# Patient Record
Sex: Female | Born: 1975 | Race: White | Hispanic: No | Marital: Single | Smoking: Current every day smoker
Health system: Southern US, Community
[De-identification: ages and names within clinical notes are randomized; demographics above are authoritative.]

## PROBLEM LIST (undated history)

## (undated) DIAGNOSIS — T7840XA Allergy, unspecified, initial encounter: Secondary | ICD-10-CM

## (undated) DIAGNOSIS — F329 Major depressive disorder, single episode, unspecified: Secondary | ICD-10-CM

## (undated) DIAGNOSIS — K219 Gastro-esophageal reflux disease without esophagitis: Secondary | ICD-10-CM

## (undated) DIAGNOSIS — R569 Unspecified convulsions: Secondary | ICD-10-CM

## (undated) DIAGNOSIS — I10 Essential (primary) hypertension: Secondary | ICD-10-CM

## (undated) DIAGNOSIS — Z720 Tobacco use: Secondary | ICD-10-CM

## (undated) DIAGNOSIS — F32A Depression, unspecified: Secondary | ICD-10-CM

## (undated) DIAGNOSIS — M797 Fibromyalgia: Secondary | ICD-10-CM

## (undated) DIAGNOSIS — M1990 Unspecified osteoarthritis, unspecified site: Secondary | ICD-10-CM

## (undated) DIAGNOSIS — C50919 Malignant neoplasm of unspecified site of unspecified female breast: Secondary | ICD-10-CM

## (undated) DIAGNOSIS — G40409 Other generalized epilepsy and epileptic syndromes, not intractable, without status epilepticus: Secondary | ICD-10-CM

## (undated) HISTORY — PX: TUBAL LIGATION: SHX77

## (undated) HISTORY — DX: Malignant neoplasm of unspecified site of unspecified female breast: C50.919

## (undated) HISTORY — DX: Tobacco use: Z72.0

## (undated) HISTORY — DX: Unspecified osteoarthritis, unspecified site: M19.90

## (undated) HISTORY — DX: Other generalized epilepsy and epileptic syndromes, not intractable, without status epilepticus: G40.409

## (undated) HISTORY — DX: Allergy, unspecified, initial encounter: T78.40XA

## (undated) HISTORY — DX: Essential (primary) hypertension: I10

## (undated) HISTORY — PX: MASTECTOMY: SHX3

## (undated) HISTORY — DX: Unspecified convulsions: R56.9

## (undated) HISTORY — PX: TONSILLECTOMY: SUR1361

## (undated) HISTORY — DX: Gastro-esophageal reflux disease without esophagitis: K21.9

## (undated) HISTORY — DX: Major depressive disorder, single episode, unspecified: F32.9

## (undated) HISTORY — DX: Fibromyalgia: M79.7

## (undated) HISTORY — PX: APPENDECTOMY: SHX54

## (undated) HISTORY — DX: Depression, unspecified: F32.A

## (undated) HISTORY — PX: BREAST SURGERY: SHX581

## (undated) HISTORY — PX: OTHER SURGICAL HISTORY: SHX169

---

## 2009-02-22 ENCOUNTER — Emergency Department (HOSPITAL_BASED_OUTPATIENT_CLINIC_OR_DEPARTMENT_OTHER): Admission: EM | Admit: 2009-02-22 | Discharge: 2009-02-22 | Payer: Self-pay | Admitting: Emergency Medicine

## 2009-02-28 ENCOUNTER — Encounter: Admission: RE | Admit: 2009-02-28 | Discharge: 2009-02-28 | Payer: Self-pay | Admitting: Emergency Medicine

## 2009-04-02 ENCOUNTER — Encounter (INDEPENDENT_AMBULATORY_CARE_PROVIDER_SITE_OTHER): Payer: Self-pay | Admitting: Surgery

## 2009-04-02 ENCOUNTER — Ambulatory Visit (HOSPITAL_BASED_OUTPATIENT_CLINIC_OR_DEPARTMENT_OTHER): Admission: RE | Admit: 2009-04-02 | Discharge: 2009-04-02 | Payer: Self-pay | Admitting: Surgery

## 2009-04-05 ENCOUNTER — Ambulatory Visit: Payer: Self-pay | Admitting: Oncology

## 2009-04-05 LAB — COMPREHENSIVE METABOLIC PANEL
Albumin: 3.9 g/dL (ref 3.5–5.2)
Alkaline Phosphatase: 60 U/L (ref 39–117)
BUN: 8 mg/dL (ref 6–23)
Calcium: 9.1 mg/dL (ref 8.4–10.5)
Glucose, Bld: 84 mg/dL (ref 70–99)
Potassium: 3.8 mEq/L (ref 3.5–5.3)

## 2009-04-05 LAB — CBC WITH DIFFERENTIAL/PLATELET
BASO%: 0.8 % (ref 0.0–2.0)
EOS%: 2.6 % (ref 0.0–7.0)
HCT: 31.9 % — ABNORMAL LOW (ref 34.8–46.6)
LYMPH%: 27.6 % (ref 14.0–49.7)
MCH: 23.4 pg — ABNORMAL LOW (ref 25.1–34.0)
MCHC: 32.2 g/dL (ref 31.5–36.0)
MONO#: 0.8 10*3/uL (ref 0.1–0.9)
NEUT%: 61 % (ref 38.4–76.8)
RBC: 4.39 10*6/uL (ref 3.70–5.45)
WBC: 10.1 10*3/uL (ref 3.9–10.3)
lymph#: 2.8 10*3/uL (ref 0.9–3.3)

## 2009-04-05 LAB — LACTATE DEHYDROGENASE: LDH: 123 U/L (ref 94–250)

## 2009-04-06 ENCOUNTER — Encounter: Admission: RE | Admit: 2009-04-06 | Discharge: 2009-04-06 | Payer: Self-pay | Admitting: Surgery

## 2009-04-06 ENCOUNTER — Encounter (INDEPENDENT_AMBULATORY_CARE_PROVIDER_SITE_OTHER): Payer: Self-pay | Admitting: Surgery

## 2009-04-06 LAB — CANCER ANTIGEN 27.29: CA 27.29: 29 U/mL (ref 0–39)

## 2009-04-06 LAB — VITAMIN D 25 HYDROXY (VIT D DEFICIENCY, FRACTURES): Vit D, 25-Hydroxy: 25 ng/mL — ABNORMAL LOW (ref 30–89)

## 2009-04-09 ENCOUNTER — Ambulatory Visit (HOSPITAL_BASED_OUTPATIENT_CLINIC_OR_DEPARTMENT_OTHER): Admission: RE | Admit: 2009-04-09 | Discharge: 2009-04-10 | Payer: Self-pay | Admitting: Surgery

## 2009-04-09 ENCOUNTER — Encounter (INDEPENDENT_AMBULATORY_CARE_PROVIDER_SITE_OTHER): Payer: Self-pay | Admitting: Surgery

## 2009-04-17 LAB — MORPHOLOGY: PLT EST: INCREASED

## 2009-04-17 LAB — CHCC SMEAR

## 2009-04-17 LAB — CBC WITH DIFFERENTIAL/PLATELET
BASO%: 0.6 % (ref 0.0–2.0)
EOS%: 2.4 % (ref 0.0–7.0)
MCH: 22.9 pg — ABNORMAL LOW (ref 25.1–34.0)
MCHC: 31.9 g/dL (ref 31.5–36.0)
RBC: 4.6 10*6/uL (ref 3.70–5.45)
RDW: 16.1 % — ABNORMAL HIGH (ref 11.2–14.5)
lymph#: 2.7 10*3/uL (ref 0.9–3.3)

## 2009-04-17 LAB — IRON AND TIBC: Iron: 15 ug/dL — ABNORMAL LOW (ref 42–145)

## 2009-04-17 LAB — FERRITIN: Ferritin: 7 ng/mL — ABNORMAL LOW (ref 10–291)

## 2009-04-20 ENCOUNTER — Encounter: Admission: RE | Admit: 2009-04-20 | Discharge: 2009-04-20 | Payer: Self-pay | Admitting: Oncology

## 2009-04-24 ENCOUNTER — Ambulatory Visit (HOSPITAL_COMMUNITY): Admission: RE | Admit: 2009-04-24 | Discharge: 2009-04-24 | Payer: Self-pay | Admitting: Oncology

## 2009-04-26 LAB — CBC WITH DIFFERENTIAL/PLATELET
BASO%: 0.7 % (ref 0.0–2.0)
EOS%: 4.5 % (ref 0.0–7.0)
HCT: 30.1 % — ABNORMAL LOW (ref 34.8–46.6)
LYMPH%: 34.5 % (ref 14.0–49.7)
MCH: 23.3 pg — ABNORMAL LOW (ref 25.1–34.0)
MCHC: 32.4 g/dL (ref 31.5–36.0)
MCV: 71.8 fL — ABNORMAL LOW (ref 79.5–101.0)
MONO#: 0.6 10*3/uL (ref 0.1–0.9)
NEUT%: 51.7 % (ref 38.4–76.8)
Platelets: 365 10*3/uL (ref 145–400)

## 2009-04-26 LAB — PROTEIN / CREATININE RATIO, URINE: Creatinine, Urine: 111.4 mg/dL

## 2009-04-26 LAB — PROTHROMBIN TIME: INR: 1 (ref 0.0–1.5)

## 2009-04-26 LAB — IRON AND TIBC: TIBC: 420 ug/dL (ref 250–470)

## 2009-04-26 LAB — MORPHOLOGY: PLT EST: ADEQUATE

## 2009-04-26 LAB — FERRITIN: Ferritin: 7 ng/mL — ABNORMAL LOW (ref 10–291)

## 2009-05-01 ENCOUNTER — Encounter: Payer: Self-pay | Admitting: Cardiovascular Disease

## 2009-05-01 ENCOUNTER — Encounter: Payer: Self-pay | Admitting: Oncology

## 2009-05-01 ENCOUNTER — Ambulatory Visit: Payer: Self-pay

## 2009-05-08 ENCOUNTER — Ambulatory Visit: Admission: RE | Admit: 2009-05-08 | Discharge: 2009-07-03 | Payer: Self-pay | Admitting: Radiation Oncology

## 2009-05-09 ENCOUNTER — Ambulatory Visit (HOSPITAL_COMMUNITY): Admission: RE | Admit: 2009-05-09 | Discharge: 2009-05-09 | Payer: Self-pay | Admitting: Oncology

## 2009-05-11 ENCOUNTER — Other Ambulatory Visit: Payer: Self-pay | Admitting: Internal Medicine

## 2009-05-11 ENCOUNTER — Ambulatory Visit: Payer: Self-pay | Admitting: Internal Medicine

## 2009-05-11 ENCOUNTER — Encounter: Payer: Self-pay | Admitting: Internal Medicine

## 2009-05-11 DIAGNOSIS — F17200 Nicotine dependence, unspecified, uncomplicated: Secondary | ICD-10-CM | POA: Insufficient documentation

## 2009-05-11 DIAGNOSIS — R9389 Abnormal findings on diagnostic imaging of other specified body structures: Secondary | ICD-10-CM | POA: Insufficient documentation

## 2009-05-16 ENCOUNTER — Encounter: Payer: Self-pay | Admitting: Internal Medicine

## 2009-05-16 LAB — CBC WITH DIFFERENTIAL/PLATELET
BASO%: 0.6 % (ref 0.0–2.0)
Basophils Absolute: 0 10*3/uL (ref 0.0–0.1)
EOS%: 3.7 % (ref 0.0–7.0)
HGB: 12.3 g/dL (ref 11.6–15.9)
MCH: 25.9 pg (ref 25.1–34.0)
MCHC: 33.2 g/dL (ref 31.5–36.0)
MCV: 77.9 fL — ABNORMAL LOW (ref 79.5–101.0)
MONO%: 8.6 % (ref 0.0–14.0)
RBC: 4.76 10*6/uL (ref 3.70–5.45)
RDW: 25.2 % — ABNORMAL HIGH (ref 11.2–14.5)
lymph#: 1.6 10*3/uL (ref 0.9–3.3)

## 2009-05-16 LAB — COMPREHENSIVE METABOLIC PANEL
ALT: 14 U/L (ref 0–35)
Albumin: 3.8 g/dL (ref 3.5–5.2)
Alkaline Phosphatase: 70 U/L (ref 39–117)
CO2: 23 mEq/L (ref 19–32)
Glucose, Bld: 104 mg/dL — ABNORMAL HIGH (ref 70–99)
Potassium: 4.6 mEq/L (ref 3.5–5.3)
Sodium: 135 mEq/L (ref 135–145)
Total Protein: 7 g/dL (ref 6.0–8.3)

## 2009-05-22 ENCOUNTER — Ambulatory Visit: Payer: Self-pay | Admitting: Oncology

## 2009-05-24 ENCOUNTER — Encounter: Payer: Self-pay | Admitting: Internal Medicine

## 2009-05-24 LAB — CBC WITH DIFFERENTIAL/PLATELET
BASO%: 0.7 % (ref 0.0–2.0)
EOS%: 5 % (ref 0.0–7.0)
Eosinophils Absolute: 0.2 10*3/uL (ref 0.0–0.5)
MCHC: 33.7 g/dL (ref 31.5–36.0)
MCV: 78.7 fL — ABNORMAL LOW (ref 79.5–101.0)
MONO%: 5.4 % (ref 0.0–14.0)
NEUT#: 2.6 10*3/uL (ref 1.5–6.5)
RBC: 4.13 10*6/uL (ref 3.70–5.45)
RDW: 24 % — ABNORMAL HIGH (ref 11.2–14.5)
WBC: 4.9 10*3/uL (ref 3.9–10.3)

## 2009-05-28 ENCOUNTER — Encounter: Payer: Self-pay | Admitting: Cardiovascular Disease

## 2009-05-29 ENCOUNTER — Encounter: Payer: Self-pay | Admitting: Oncology

## 2009-05-29 ENCOUNTER — Ambulatory Visit: Payer: Self-pay

## 2009-05-29 ENCOUNTER — Encounter: Payer: Self-pay | Admitting: Cardiology

## 2009-05-30 ENCOUNTER — Encounter: Payer: Self-pay | Admitting: Internal Medicine

## 2009-05-30 LAB — CBC WITH DIFFERENTIAL/PLATELET
BASO%: 0.5 % (ref 0.0–2.0)
Eosinophils Absolute: 0.1 10*3/uL (ref 0.0–0.5)
MCHC: 33.7 g/dL (ref 31.5–36.0)
MONO#: 0.8 10*3/uL (ref 0.1–0.9)
NEUT#: 7.1 10*3/uL — ABNORMAL HIGH (ref 1.5–6.5)
Platelets: 263 10*3/uL (ref 145–400)
RBC: 4.52 10*6/uL (ref 3.70–5.45)
RDW: 25 % — ABNORMAL HIGH (ref 11.2–14.5)
WBC: 10.4 10*3/uL — ABNORMAL HIGH (ref 3.9–10.3)
lymph#: 2.4 10*3/uL (ref 0.9–3.3)

## 2009-05-30 LAB — COMPREHENSIVE METABOLIC PANEL
ALT: 14 U/L (ref 0–35)
Albumin: 4.1 g/dL (ref 3.5–5.2)
CO2: 25 mEq/L (ref 19–32)
Chloride: 106 mEq/L (ref 96–112)
Glucose, Bld: 85 mg/dL (ref 70–99)
Potassium: 4.8 mEq/L (ref 3.5–5.3)
Sodium: 141 mEq/L (ref 135–145)
Total Protein: 6.9 g/dL (ref 6.0–8.3)

## 2009-05-30 LAB — BRAIN NATRIURETIC PEPTIDE: Brain Natriuretic Peptide: 58.3 pg/mL (ref 0.0–100.0)

## 2009-06-07 ENCOUNTER — Encounter: Payer: Self-pay | Admitting: Internal Medicine

## 2009-06-07 LAB — CBC WITH DIFFERENTIAL/PLATELET
Eosinophils Absolute: 0 10*3/uL (ref 0.0–0.5)
HCT: 32.6 % — ABNORMAL LOW (ref 34.8–46.6)
HGB: 10.9 g/dL — ABNORMAL LOW (ref 11.6–15.9)
LYMPH%: 17.2 % (ref 14.0–49.7)
MONO#: 0.3 10*3/uL (ref 0.1–0.9)
NEUT#: 5.5 10*3/uL (ref 1.5–6.5)
NEUT%: 78 % — ABNORMAL HIGH (ref 38.4–76.8)
Platelets: 240 10*3/uL (ref 145–400)
WBC: 7 10*3/uL (ref 3.9–10.3)

## 2009-06-11 ENCOUNTER — Emergency Department (HOSPITAL_COMMUNITY): Admission: EM | Admit: 2009-06-11 | Discharge: 2009-06-11 | Payer: Self-pay | Admitting: Emergency Medicine

## 2009-06-12 ENCOUNTER — Ambulatory Visit: Payer: Self-pay

## 2009-06-12 ENCOUNTER — Encounter: Payer: Self-pay | Admitting: Oncology

## 2009-06-13 ENCOUNTER — Encounter: Payer: Self-pay | Admitting: Internal Medicine

## 2009-06-13 LAB — CBC WITH DIFFERENTIAL/PLATELET
BASO%: 0.8 % (ref 0.0–2.0)
Basophils Absolute: 0.1 10*3/uL (ref 0.0–0.1)
EOS%: 0.7 % (ref 0.0–7.0)
HCT: 36.5 % (ref 34.8–46.6)
HGB: 12.4 g/dL (ref 11.6–15.9)
LYMPH%: 26.8 % (ref 14.0–49.7)
MCH: 27.3 pg (ref 25.1–34.0)
MCHC: 33.9 g/dL (ref 31.5–36.0)
MONO#: 1 10*3/uL — ABNORMAL HIGH (ref 0.1–0.9)
NEUT%: 57.5 % (ref 38.4–76.8)
Platelets: 282 10*3/uL (ref 145–400)

## 2009-06-13 LAB — COMPREHENSIVE METABOLIC PANEL
ALT: 22 U/L (ref 0–35)
BUN: 9 mg/dL (ref 6–23)
CO2: 28 mEq/L (ref 19–32)
Calcium: 9.8 mg/dL (ref 8.4–10.5)
Creatinine, Ser: 0.71 mg/dL (ref 0.40–1.20)
Total Bilirubin: 0.6 mg/dL (ref 0.3–1.2)

## 2009-06-13 LAB — PROTEIN / CREATININE RATIO, URINE
Protein Creatinine Ratio: 0.06 (ref <0.15)
Total Protein, Urine: 21 mg/dL

## 2009-06-13 LAB — BRAIN NATRIURETIC PEPTIDE: Brain Natriuretic Peptide: <30 pg/mL (ref 0.0–100.0)

## 2009-06-14 ENCOUNTER — Encounter: Admission: RE | Admit: 2009-06-14 | Discharge: 2009-06-14 | Payer: Self-pay | Admitting: Oncology

## 2009-06-21 ENCOUNTER — Ambulatory Visit: Payer: Self-pay | Admitting: Oncology

## 2009-06-21 ENCOUNTER — Encounter: Payer: Self-pay | Admitting: Internal Medicine

## 2009-06-21 LAB — CBC WITH DIFFERENTIAL/PLATELET
BASO%: 0.6 % (ref 0.0–2.0)
EOS%: 0.8 % (ref 0.0–7.0)
HCT: 32.1 % — ABNORMAL LOW (ref 34.8–46.6)
MCH: 27.4 pg (ref 25.1–34.0)
MCHC: 34 g/dL (ref 31.5–36.0)
MONO#: 0.2 10*3/uL (ref 0.1–0.9)
NEUT%: 82.4 % — ABNORMAL HIGH (ref 38.4–76.8)
RBC: 3.98 10*6/uL (ref 3.70–5.45)
RDW: 25.3 % — ABNORMAL HIGH (ref 11.2–14.5)
WBC: 5.7 10*3/uL (ref 3.9–10.3)
lymph#: 0.7 10*3/uL — ABNORMAL LOW (ref 0.9–3.3)

## 2009-06-26 ENCOUNTER — Encounter: Payer: Self-pay | Admitting: Oncology

## 2009-06-26 ENCOUNTER — Ambulatory Visit: Payer: Self-pay

## 2009-06-27 ENCOUNTER — Encounter: Payer: Self-pay | Admitting: Internal Medicine

## 2009-06-27 LAB — CBC WITH DIFFERENTIAL/PLATELET
BASO%: 0.7 % (ref 0.0–2.0)
EOS%: 1.5 % (ref 0.0–7.0)
HCT: 35.7 % (ref 34.8–46.6)
MCH: 27.2 pg (ref 25.1–34.0)
MCHC: 33.4 g/dL (ref 31.5–36.0)
MONO#: 1.1 10*3/uL — ABNORMAL HIGH (ref 0.1–0.9)
NEUT%: 67.7 % (ref 38.4–76.8)
RDW: 25.5 % — ABNORMAL HIGH (ref 11.2–14.5)
WBC: 8.4 10*3/uL (ref 3.9–10.3)
lymph#: 1.4 10*3/uL (ref 0.9–3.3)

## 2009-06-27 LAB — COMPREHENSIVE METABOLIC PANEL
ALT: 17 U/L (ref 0–35)
AST: 23 U/L (ref 0–37)
Albumin: 3.7 g/dL (ref 3.5–5.2)
CO2: 27 mEq/L (ref 19–32)
Calcium: 9.3 mg/dL (ref 8.4–10.5)
Chloride: 106 mEq/L (ref 96–112)
Potassium: 4.1 mEq/L (ref 3.5–5.3)
Sodium: 140 mEq/L (ref 135–145)
Total Protein: 6.9 g/dL (ref 6.0–8.3)

## 2009-06-27 LAB — BRAIN NATRIURETIC PEPTIDE: Brain Natriuretic Peptide: 47 pg/mL (ref 0.0–100.0)

## 2009-07-03 ENCOUNTER — Encounter: Payer: Self-pay | Admitting: Internal Medicine

## 2009-07-03 LAB — CBC WITH DIFFERENTIAL/PLATELET
BASO%: 0 % (ref 0.0–2.0)
Eosinophils Absolute: 0.1 10*3/uL (ref 0.0–0.5)
HCT: 36.1 % (ref 34.8–46.6)
MCHC: 33.3 g/dL (ref 31.5–36.0)
MONO#: 0.1 10*3/uL (ref 0.1–0.9)
NEUT#: 13.1 10*3/uL — ABNORMAL HIGH (ref 1.5–6.5)
NEUT%: 92.1 % — ABNORMAL HIGH (ref 38.4–76.8)
WBC: 14.2 10*3/uL — ABNORMAL HIGH (ref 3.9–10.3)
lymph#: 0.9 10*3/uL (ref 0.9–3.3)

## 2009-07-06 ENCOUNTER — Encounter (INDEPENDENT_AMBULATORY_CARE_PROVIDER_SITE_OTHER): Payer: Self-pay | Admitting: *Deleted

## 2009-07-06 ENCOUNTER — Ambulatory Visit: Payer: Self-pay

## 2009-07-06 ENCOUNTER — Encounter: Payer: Self-pay | Admitting: Oncology

## 2009-07-11 ENCOUNTER — Encounter: Payer: Self-pay | Admitting: Internal Medicine

## 2009-07-11 LAB — COMPREHENSIVE METABOLIC PANEL
ALT: 19 U/L (ref 0–35)
CO2: 28 mEq/L (ref 19–32)
Chloride: 106 mEq/L (ref 96–112)
Sodium: 142 mEq/L (ref 135–145)
Total Bilirubin: 0.3 mg/dL (ref 0.3–1.2)
Total Protein: 6.8 g/dL (ref 6.0–8.3)

## 2009-07-11 LAB — CBC WITH DIFFERENTIAL/PLATELET
Basophils Absolute: 0 10*3/uL (ref 0.0–0.1)
HCT: 35.9 % (ref 34.8–46.6)
HGB: 11.9 g/dL (ref 11.6–15.9)
MCH: 27.7 pg (ref 25.1–34.0)
MONO#: 1.1 10*3/uL — ABNORMAL HIGH (ref 0.1–0.9)
NEUT%: 61 % (ref 38.4–76.8)
Platelets: 239 10*3/uL (ref 145–400)
WBC: 6.2 10*3/uL (ref 3.9–10.3)
lymph#: 1.1 10*3/uL (ref 0.9–3.3)

## 2009-07-11 LAB — APTT: aPTT: 27 seconds (ref 24–37)

## 2009-07-11 LAB — PROTIME-INR

## 2009-07-11 LAB — PROTEIN / CREATININE RATIO, URINE: Creatinine, Urine: 45.4 mg/dL

## 2009-07-17 ENCOUNTER — Ambulatory Visit: Payer: Self-pay | Admitting: Oncology

## 2009-07-19 ENCOUNTER — Encounter: Payer: Self-pay | Admitting: Internal Medicine

## 2009-07-19 LAB — CBC WITH DIFFERENTIAL/PLATELET
Basophils Absolute: 0.1 10*3/uL (ref 0.0–0.1)
EOS%: 1.4 % (ref 0.0–7.0)
HGB: 12.2 g/dL (ref 11.6–15.9)
LYMPH%: 18.9 % (ref 14.0–49.7)
MCH: 28.5 pg (ref 25.1–34.0)
MCV: 83.5 fL (ref 79.5–101.0)
MONO%: 10.5 % (ref 0.0–14.0)
NEUT%: 68.3 % (ref 38.4–76.8)
RDW: 23.1 % — ABNORMAL HIGH (ref 11.2–14.5)

## 2009-07-26 ENCOUNTER — Encounter: Payer: Self-pay | Admitting: Internal Medicine

## 2009-07-26 LAB — CBC WITH DIFFERENTIAL/PLATELET
EOS%: 2.5 % (ref 0.0–7.0)
MCH: 28.5 pg (ref 25.1–34.0)
MCHC: 33.4 g/dL (ref 31.5–36.0)
MCV: 85.3 fL (ref 79.5–101.0)
MONO%: 12.2 % (ref 0.0–14.0)
NEUT#: 3.9 10*3/uL (ref 1.5–6.5)
RBC: 4.19 10*6/uL (ref 3.70–5.45)
RDW: 21.9 % — ABNORMAL HIGH (ref 11.2–14.5)

## 2009-08-01 ENCOUNTER — Encounter: Payer: Self-pay | Admitting: Internal Medicine

## 2009-08-01 LAB — CBC WITH DIFFERENTIAL/PLATELET
BASO%: 0.7 % (ref 0.0–2.0)
LYMPH%: 20.3 % (ref 14.0–49.7)
MCHC: 33.7 g/dL (ref 31.5–36.0)
MONO#: 1 10*3/uL — ABNORMAL HIGH (ref 0.1–0.9)
MONO%: 14.9 % — ABNORMAL HIGH (ref 0.0–14.0)
NEUT#: 3.9 10*3/uL (ref 1.5–6.5)
Platelets: 321 10*3/uL (ref 145–400)
RBC: 4.44 10*6/uL (ref 3.70–5.45)
RDW: 19.7 % — ABNORMAL HIGH (ref 11.2–14.5)
WBC: 6.6 10*3/uL (ref 3.9–10.3)

## 2009-08-01 LAB — COMPREHENSIVE METABOLIC PANEL
ALT: 37 U/L — ABNORMAL HIGH (ref 0–35)
Albumin: 3.8 g/dL (ref 3.5–5.2)
Alkaline Phosphatase: 67 U/L (ref 39–117)
CO2: 26 mEq/L (ref 19–32)
Potassium: 4.4 mEq/L (ref 3.5–5.3)
Sodium: 139 mEq/L (ref 135–145)
Total Bilirubin: 0.2 mg/dL — ABNORMAL LOW (ref 0.3–1.2)
Total Protein: 7.3 g/dL (ref 6.0–8.3)

## 2009-08-01 LAB — URINALYSIS, MICROSCOPIC - CHCC
Nitrite: NEGATIVE
pH: 5 (ref 4.6–8.0)

## 2009-08-09 ENCOUNTER — Encounter: Payer: Self-pay | Admitting: Internal Medicine

## 2009-08-09 LAB — CBC WITH DIFFERENTIAL/PLATELET
Basophils Absolute: 0.1 10*3/uL (ref 0.0–0.1)
Eosinophils Absolute: 0.5 10*3/uL (ref 0.0–0.5)
HCT: 40.6 % (ref 34.8–46.6)
HGB: 13.5 g/dL (ref 11.6–15.9)
LYMPH%: 19.5 % (ref 14.0–49.7)
MONO#: 0.6 10*3/uL (ref 0.1–0.9)
NEUT#: 5.9 10*3/uL (ref 1.5–6.5)
NEUT%: 66.6 % (ref 38.4–76.8)
Platelets: 328 10*3/uL (ref 145–400)
WBC: 8.8 10*3/uL (ref 3.9–10.3)

## 2009-08-20 ENCOUNTER — Ambulatory Visit: Payer: Self-pay | Admitting: Oncology

## 2009-08-22 ENCOUNTER — Encounter: Payer: Self-pay | Admitting: Family Medicine

## 2009-08-22 ENCOUNTER — Encounter: Payer: Self-pay | Admitting: Internal Medicine

## 2009-08-22 LAB — CBC WITH DIFFERENTIAL/PLATELET
BASO%: 0.7 % (ref 0.0–2.0)
HCT: 35.7 % (ref 34.8–46.6)
LYMPH%: 20.3 % (ref 14.0–49.7)
MCH: 29.5 pg (ref 25.1–34.0)
MCHC: 34 g/dL (ref 31.5–36.0)
MCV: 87 fL (ref 79.5–101.0)
MONO#: 0.8 10*3/uL (ref 0.1–0.9)
NEUT%: 60.2 % (ref 38.4–76.8)
Platelets: 315 10*3/uL (ref 145–400)
WBC: 5.6 10*3/uL (ref 3.9–10.3)

## 2009-08-22 LAB — COMPREHENSIVE METABOLIC PANEL
BUN: 7 mg/dL (ref 6–23)
CO2: 25 mEq/L (ref 19–32)
Calcium: 9 mg/dL (ref 8.4–10.5)
Chloride: 111 mEq/L (ref 96–112)
Creatinine, Ser: 0.67 mg/dL (ref 0.40–1.20)
Total Bilirubin: 0.5 mg/dL (ref 0.3–1.2)

## 2009-08-22 LAB — PROTEIN / CREATININE RATIO, URINE
Creatinine, Urine: 128.5 mg/dL
Protein Creatinine Ratio: 0.06 (ref <0.15)
Total Protein, Urine: 8 mg/dL

## 2009-08-30 ENCOUNTER — Encounter: Payer: Self-pay | Admitting: Internal Medicine

## 2009-08-30 LAB — CBC WITH DIFFERENTIAL/PLATELET
BASO%: 0.3 % (ref 0.0–2.0)
Eosinophils Absolute: 0.2 10*3/uL (ref 0.0–0.5)
LYMPH%: 17.1 % (ref 14.0–49.7)
MCHC: 34.3 g/dL (ref 31.5–36.0)
MONO#: 0.4 10*3/uL (ref 0.1–0.9)
NEUT#: 5 10*3/uL (ref 1.5–6.5)
Platelets: 285 10*3/uL (ref 145–400)
RBC: 4.09 10*6/uL (ref 3.70–5.45)
RDW: 15.9 % — ABNORMAL HIGH (ref 11.2–14.5)
WBC: 6.7 10*3/uL (ref 3.9–10.3)
lymph#: 1.1 10*3/uL (ref 0.9–3.3)

## 2009-09-02 ENCOUNTER — Encounter: Payer: Self-pay | Admitting: Emergency Medicine

## 2009-09-02 ENCOUNTER — Encounter: Payer: Self-pay | Admitting: Family Medicine

## 2009-09-02 DIAGNOSIS — F411 Generalized anxiety disorder: Secondary | ICD-10-CM

## 2009-09-02 DIAGNOSIS — R569 Unspecified convulsions: Secondary | ICD-10-CM

## 2009-09-02 DIAGNOSIS — C50919 Malignant neoplasm of unspecified site of unspecified female breast: Secondary | ICD-10-CM

## 2009-09-03 ENCOUNTER — Ambulatory Visit: Payer: Self-pay | Admitting: Family Medicine

## 2009-09-03 ENCOUNTER — Observation Stay (HOSPITAL_COMMUNITY): Admission: EM | Admit: 2009-09-03 | Discharge: 2009-09-03 | Payer: Self-pay | Admitting: Family Medicine

## 2009-09-06 ENCOUNTER — Encounter: Payer: Self-pay | Admitting: Internal Medicine

## 2009-09-06 LAB — CBC WITH DIFFERENTIAL/PLATELET
Basophils Absolute: 0.1 10*3/uL (ref 0.0–0.1)
EOS%: 2.4 % (ref 0.0–7.0)
Eosinophils Absolute: 0.2 10*3/uL (ref 0.0–0.5)
HCT: 41 % (ref 34.8–46.6)
HGB: 13.4 g/dL (ref 11.6–15.9)
MONO#: 0.5 10*3/uL (ref 0.1–0.9)
NEUT#: 5.3 10*3/uL (ref 1.5–6.5)
RDW: 15.9 % — ABNORMAL HIGH (ref 11.2–14.5)
lymph#: 1.5 10*3/uL (ref 0.9–3.3)

## 2009-09-10 ENCOUNTER — Ambulatory Visit: Payer: Self-pay | Admitting: Family Medicine

## 2009-09-10 DIAGNOSIS — J328 Other chronic sinusitis: Secondary | ICD-10-CM | POA: Insufficient documentation

## 2009-09-12 ENCOUNTER — Encounter: Payer: Self-pay | Admitting: Internal Medicine

## 2009-09-12 LAB — COMPREHENSIVE METABOLIC PANEL
ALT: 31 U/L (ref 0–35)
AST: 26 U/L (ref 0–37)
Alkaline Phosphatase: 88 U/L (ref 39–117)
BUN: 7 mg/dL (ref 6–23)
Calcium: 10.4 mg/dL (ref 8.4–10.5)
Chloride: 103 mEq/L (ref 96–112)
Creatinine, Ser: 0.76 mg/dL (ref 0.40–1.20)
Potassium: 4.6 mEq/L (ref 3.5–5.3)

## 2009-09-12 LAB — CBC WITH DIFFERENTIAL/PLATELET
BASO%: 0.4 % (ref 0.0–2.0)
EOS%: 2.2 % (ref 0.0–7.0)
HCT: 41.1 % (ref 34.8–46.6)
HGB: 13.9 g/dL (ref 11.6–15.9)
MCHC: 33.7 g/dL (ref 31.5–36.0)
MONO%: 7.1 % (ref 0.0–14.0)
NEUT#: 5.1 10*3/uL (ref 1.5–6.5)
NEUT%: 72.2 % (ref 38.4–76.8)
Platelets: 373 10*3/uL (ref 145–400)
RDW: 16.5 % — ABNORMAL HIGH (ref 11.2–14.5)
WBC: 7 10*3/uL (ref 3.9–10.3)
lymph#: 1.3 10*3/uL (ref 0.9–3.3)

## 2009-09-20 ENCOUNTER — Ambulatory Visit: Payer: Self-pay | Admitting: Oncology

## 2009-09-20 ENCOUNTER — Encounter: Payer: Self-pay | Admitting: Internal Medicine

## 2009-09-20 LAB — CBC WITH DIFFERENTIAL/PLATELET
BASO%: 0.8 % (ref 0.0–2.0)
Basophils Absolute: 0.1 10*3/uL (ref 0.0–0.1)
EOS%: 2 % (ref 0.0–7.0)
HCT: 41.3 % (ref 34.8–46.6)
HGB: 13.7 g/dL (ref 11.6–15.9)
LYMPH%: 22.4 % (ref 14.0–49.7)
MCH: 29.2 pg (ref 25.1–34.0)
MCHC: 33.2 g/dL (ref 31.5–36.0)
MCV: 88.1 fL (ref 79.5–101.0)
MONO%: 7.1 % (ref 0.0–14.0)
NEUT%: 67.7 % (ref 38.4–76.8)
lymph#: 1.6 10*3/uL (ref 0.9–3.3)

## 2009-09-27 ENCOUNTER — Encounter: Payer: Self-pay | Admitting: Internal Medicine

## 2009-09-27 LAB — CBC WITH DIFFERENTIAL/PLATELET
BASO%: 1.1 % (ref 0.0–2.0)
Basophils Absolute: 0.1 10*3/uL (ref 0.0–0.1)
EOS%: 2 % (ref 0.0–7.0)
MCH: 29.9 pg (ref 25.1–34.0)
MCHC: 33.4 g/dL (ref 31.5–36.0)
MCV: 89.5 fL (ref 79.5–101.0)
MONO%: 7.2 % (ref 0.0–14.0)
RBC: 4.85 10*6/uL (ref 3.70–5.45)
RDW: 16.2 % — ABNORMAL HIGH (ref 11.2–14.5)
lymph#: 1.6 10*3/uL (ref 0.9–3.3)

## 2009-10-01 ENCOUNTER — Ambulatory Visit: Payer: Self-pay

## 2009-10-01 ENCOUNTER — Encounter: Payer: Self-pay | Admitting: Family Medicine

## 2009-10-01 ENCOUNTER — Ambulatory Visit: Payer: Self-pay | Admitting: Internal Medicine

## 2009-10-01 ENCOUNTER — Encounter: Payer: Self-pay | Admitting: Oncology

## 2009-10-01 ENCOUNTER — Ambulatory Visit (HOSPITAL_COMMUNITY): Admission: RE | Admit: 2009-10-01 | Discharge: 2009-10-01 | Payer: Self-pay | Admitting: Oncology

## 2009-10-02 ENCOUNTER — Encounter: Payer: Self-pay | Admitting: Internal Medicine

## 2009-10-03 ENCOUNTER — Ambulatory Visit: Admission: RE | Admit: 2009-10-03 | Discharge: 2009-11-30 | Payer: Self-pay | Admitting: Radiation Oncology

## 2009-10-04 ENCOUNTER — Encounter: Payer: Self-pay | Admitting: Internal Medicine

## 2009-10-18 ENCOUNTER — Encounter: Payer: Self-pay | Admitting: Internal Medicine

## 2009-10-18 LAB — CBC WITH DIFFERENTIAL/PLATELET
Basophils Absolute: 0 10*3/uL (ref 0.0–0.1)
Eosinophils Absolute: 0.3 10*3/uL (ref 0.0–0.5)
HCT: 43.6 % (ref 34.8–46.6)
HGB: 14.7 g/dL (ref 11.6–15.9)
MCH: 30 pg (ref 25.1–34.0)
MCV: 89.1 fL (ref 79.5–101.0)
MONO%: 9.1 % (ref 0.0–14.0)
NEUT#: 6.2 10*3/uL (ref 1.5–6.5)
NEUT%: 70.6 % (ref 38.4–76.8)
RDW: 16.7 % — ABNORMAL HIGH (ref 11.2–14.5)
lymph#: 1.5 10*3/uL (ref 0.9–3.3)

## 2009-10-18 LAB — COMPREHENSIVE METABOLIC PANEL
Albumin: 4.2 g/dL (ref 3.5–5.2)
BUN: 7 mg/dL (ref 6–23)
Calcium: 9.6 mg/dL (ref 8.4–10.5)
Chloride: 105 mEq/L (ref 96–112)
Creatinine, Ser: 0.67 mg/dL (ref 0.40–1.20)
Glucose, Bld: 100 mg/dL — ABNORMAL HIGH (ref 70–99)
Potassium: 4.3 mEq/L (ref 3.5–5.3)

## 2009-11-08 ENCOUNTER — Ambulatory Visit: Payer: Self-pay | Admitting: Oncology

## 2009-11-12 ENCOUNTER — Encounter: Admission: RE | Admit: 2009-11-12 | Discharge: 2009-11-12 | Payer: Self-pay | Admitting: Oncology

## 2009-12-03 ENCOUNTER — Ambulatory Visit: Admission: RE | Admit: 2009-12-03 | Discharge: 2009-12-14 | Payer: Self-pay | Admitting: Radiation Oncology

## 2009-12-18 ENCOUNTER — Ambulatory Visit: Payer: Self-pay | Admitting: Oncology

## 2009-12-26 ENCOUNTER — Encounter: Payer: Self-pay | Admitting: Internal Medicine

## 2009-12-27 ENCOUNTER — Encounter: Payer: Self-pay | Admitting: Internal Medicine

## 2009-12-27 LAB — COMPREHENSIVE METABOLIC PANEL
AST: 18 U/L (ref 0–37)
Alkaline Phosphatase: 58 U/L (ref 39–117)
BUN: 11 mg/dL (ref 6–23)
Creatinine, Ser: 0.65 mg/dL (ref 0.40–1.20)
Potassium: 4.1 mEq/L (ref 3.5–5.3)

## 2009-12-27 LAB — CBC WITH DIFFERENTIAL/PLATELET
Basophils Absolute: 0 10*3/uL (ref 0.0–0.1)
EOS%: 2.6 % (ref 0.0–7.0)
HGB: 14.4 g/dL (ref 11.6–15.9)
MCH: 30.6 pg (ref 25.1–34.0)
MCV: 89.5 fL (ref 79.5–101.0)
MONO%: 6.9 % (ref 0.0–14.0)
NEUT%: 78.6 % — ABNORMAL HIGH (ref 38.4–76.8)
RDW: 14 % (ref 11.2–14.5)

## 2010-01-04 ENCOUNTER — Ambulatory Visit: Payer: Self-pay | Admitting: Family Medicine

## 2010-01-04 DIAGNOSIS — R1011 Right upper quadrant pain: Secondary | ICD-10-CM

## 2010-01-04 DIAGNOSIS — F329 Major depressive disorder, single episode, unspecified: Secondary | ICD-10-CM

## 2010-01-04 DIAGNOSIS — G4700 Insomnia, unspecified: Secondary | ICD-10-CM

## 2010-01-09 ENCOUNTER — Encounter: Admission: RE | Admit: 2010-01-09 | Discharge: 2010-01-09 | Payer: Self-pay | Admitting: Family Medicine

## 2010-01-13 ENCOUNTER — Encounter: Payer: Self-pay | Admitting: Family Medicine

## 2010-01-16 ENCOUNTER — Encounter: Admission: RE | Admit: 2010-01-16 | Discharge: 2010-01-16 | Payer: Self-pay | Admitting: Surgery

## 2010-01-16 ENCOUNTER — Ambulatory Visit: Payer: Self-pay | Admitting: Psychiatry

## 2010-01-23 ENCOUNTER — Ambulatory Visit: Payer: Self-pay | Admitting: Psychiatry

## 2010-01-29 HISTORY — PX: CHOLECYSTECTOMY: SHX55

## 2010-02-04 ENCOUNTER — Encounter (INDEPENDENT_AMBULATORY_CARE_PROVIDER_SITE_OTHER): Payer: Self-pay | Admitting: Surgery

## 2010-02-04 ENCOUNTER — Inpatient Hospital Stay (HOSPITAL_COMMUNITY): Admission: RE | Admit: 2010-02-04 | Discharge: 2010-02-06 | Payer: Self-pay | Admitting: Surgery

## 2010-02-27 ENCOUNTER — Encounter: Admission: RE | Admit: 2010-02-27 | Discharge: 2010-02-27 | Payer: Self-pay | Admitting: General Surgery

## 2010-03-28 ENCOUNTER — Ambulatory Visit: Payer: Self-pay | Admitting: Oncology

## 2010-03-29 ENCOUNTER — Encounter: Payer: Self-pay | Admitting: Internal Medicine

## 2010-03-29 LAB — COMPREHENSIVE METABOLIC PANEL
ALT: 15 U/L (ref 0–35)
CO2: 22 mEq/L (ref 19–32)
Calcium: 8.8 mg/dL (ref 8.4–10.5)
Chloride: 107 mEq/L (ref 96–112)
Glucose, Bld: 117 mg/dL — ABNORMAL HIGH (ref 70–99)
Sodium: 136 mEq/L (ref 135–145)
Total Bilirubin: 0.4 mg/dL (ref 0.3–1.2)
Total Protein: 7.4 g/dL (ref 6.0–8.3)

## 2010-03-29 LAB — CBC WITH DIFFERENTIAL/PLATELET
BASO%: 0.3 % (ref 0.0–2.0)
Eosinophils Absolute: 0.2 10*3/uL (ref 0.0–0.5)
HCT: 43.5 % (ref 34.8–46.6)
LYMPH%: 17.7 % (ref 14.0–49.7)
MCHC: 33.1 g/dL (ref 31.5–36.0)
MONO#: 0.8 10*3/uL (ref 0.1–0.9)
NEUT#: 6.5 10*3/uL (ref 1.5–6.5)
NEUT%: 70.8 % (ref 38.4–76.8)
Platelets: 306 10*3/uL (ref 145–400)
RBC: 4.85 10*6/uL (ref 3.70–5.45)
WBC: 9.1 10*3/uL (ref 3.9–10.3)
lymph#: 1.6 10*3/uL (ref 0.9–3.3)

## 2010-04-02 LAB — CANCER ANTIGEN 27.29: CA 27.29: 33 U/mL (ref 0–39)

## 2010-04-02 LAB — LEVETIRACETAM LEVEL: Keppra (Levetiracetam): 16.6 ug/mL (ref 5.0–30.0)

## 2010-05-13 ENCOUNTER — Ambulatory Visit: Payer: Self-pay | Admitting: Oncology

## 2010-05-15 ENCOUNTER — Ambulatory Visit: Payer: Self-pay

## 2010-05-15 ENCOUNTER — Encounter: Payer: Self-pay | Admitting: Oncology

## 2010-05-15 ENCOUNTER — Ambulatory Visit: Payer: Self-pay | Admitting: Cardiology

## 2010-05-15 ENCOUNTER — Ambulatory Visit (HOSPITAL_COMMUNITY): Admission: RE | Admit: 2010-05-15 | Discharge: 2010-05-15 | Payer: Self-pay | Admitting: Oncology

## 2010-06-26 ENCOUNTER — Ambulatory Visit: Payer: Self-pay | Admitting: Oncology

## 2010-06-28 ENCOUNTER — Encounter: Payer: Self-pay | Admitting: Internal Medicine

## 2010-06-28 LAB — CBC WITH DIFFERENTIAL/PLATELET
EOS%: 2.6 % (ref 0.0–7.0)
LYMPH%: 22.3 % (ref 14.0–49.7)
MCH: 28.3 pg (ref 25.1–34.0)
MCHC: 33 g/dL (ref 31.5–36.0)
MCV: 85.6 fL (ref 79.5–101.0)
MONO%: 8.4 % (ref 0.0–14.0)
Platelets: 295 10*3/uL (ref 145–400)
RBC: 4.96 10*6/uL (ref 3.70–5.45)
RDW: 13.9 % (ref 11.2–14.5)

## 2010-06-28 LAB — BASIC METABOLIC PANEL
Calcium: 10.2 mg/dL (ref 8.4–10.5)
Potassium: 4.1 mEq/L (ref 3.5–5.3)
Sodium: 142 mEq/L (ref 135–145)

## 2010-09-02 ENCOUNTER — Encounter: Admission: RE | Admit: 2010-09-02 | Discharge: 2010-09-02 | Payer: Self-pay | Admitting: Neurology

## 2010-09-23 ENCOUNTER — Encounter: Admission: RE | Admit: 2010-09-23 | Discharge: 2010-09-23 | Payer: Self-pay | Admitting: Diagnostic Neuroimaging

## 2010-09-25 ENCOUNTER — Ambulatory Visit: Payer: Self-pay | Admitting: Oncology

## 2010-09-27 ENCOUNTER — Encounter: Payer: Self-pay | Admitting: Internal Medicine

## 2010-09-27 LAB — CBC WITH DIFFERENTIAL/PLATELET
Basophils Absolute: 0 10*3/uL (ref 0.0–0.1)
EOS%: 2.5 % (ref 0.0–7.0)
HCT: 38 % (ref 34.8–46.6)
HGB: 12.7 g/dL (ref 11.6–15.9)
MCH: 28 pg (ref 25.1–34.0)
MCV: 83.7 fL (ref 79.5–101.0)
MONO%: 8.3 % (ref 0.0–14.0)
NEUT%: 69.6 % (ref 38.4–76.8)
Platelets: 326 10*3/uL (ref 145–400)

## 2010-09-28 LAB — COMPREHENSIVE METABOLIC PANEL
Albumin: 4.3 g/dL (ref 3.5–5.2)
Alkaline Phosphatase: 71 U/L (ref 39–117)
BUN: 8 mg/dL (ref 6–23)
Glucose, Bld: 107 mg/dL — ABNORMAL HIGH (ref 70–99)
Total Bilirubin: 0.2 mg/dL — ABNORMAL LOW (ref 0.3–1.2)

## 2010-09-28 LAB — LACTATE DEHYDROGENASE: LDH: 116 U/L (ref 94–250)

## 2010-09-28 LAB — CANCER ANTIGEN 27.29: CA 27.29: 21 U/mL (ref 0–39)

## 2010-10-10 ENCOUNTER — Ambulatory Visit: Payer: Self-pay | Admitting: Family Medicine

## 2010-10-10 ENCOUNTER — Encounter: Payer: Self-pay | Admitting: Family Medicine

## 2010-10-10 DIAGNOSIS — N951 Menopausal and female climacteric states: Secondary | ICD-10-CM | POA: Insufficient documentation

## 2010-10-29 ENCOUNTER — Telehealth (INDEPENDENT_AMBULATORY_CARE_PROVIDER_SITE_OTHER): Payer: Self-pay | Admitting: *Deleted

## 2010-12-10 ENCOUNTER — Ambulatory Visit: Admission: RE | Admit: 2010-12-10 | Discharge: 2010-12-10 | Payer: Self-pay | Source: Home / Self Care

## 2010-12-11 ENCOUNTER — Telehealth: Payer: Self-pay | Admitting: Family Medicine

## 2010-12-22 ENCOUNTER — Encounter: Payer: Self-pay | Admitting: Emergency Medicine

## 2010-12-23 ENCOUNTER — Encounter: Payer: Self-pay | Admitting: Oncology

## 2010-12-23 ENCOUNTER — Encounter: Payer: Self-pay | Admitting: Surgery

## 2010-12-27 ENCOUNTER — Ambulatory Visit: Payer: Self-pay | Admitting: Oncology

## 2010-12-31 LAB — COMPREHENSIVE METABOLIC PANEL
ALT: 8 U/L (ref 0–35)
Albumin: 4 g/dL (ref 3.5–5.2)
CO2: 21 mEq/L (ref 19–32)
Calcium: 9.4 mg/dL (ref 8.4–10.5)
Chloride: 105 mEq/L (ref 96–112)
Creatinine, Ser: 0.82 mg/dL (ref 0.40–1.20)
Potassium: 4.2 mEq/L (ref 3.5–5.3)
Sodium: 141 mEq/L (ref 135–145)
Total Protein: 6.7 g/dL (ref 6.0–8.3)

## 2010-12-31 LAB — CBC WITH DIFFERENTIAL/PLATELET
BASO%: 1 % (ref 0.0–2.0)
HCT: 35.6 % (ref 34.8–46.6)
HGB: 11.6 g/dL (ref 11.6–15.9)
MCHC: 32.6 g/dL (ref 31.5–36.0)
MONO#: 0.5 10*3/uL (ref 0.1–0.9)
NEUT%: 62.5 % (ref 38.4–76.8)
RDW: 15.3 % — ABNORMAL HIGH (ref 11.2–14.5)
WBC: 6.9 10*3/uL (ref 3.9–10.3)
lymph#: 1.8 10*3/uL (ref 0.9–3.3)

## 2010-12-31 LAB — LACTATE DEHYDROGENASE: LDH: 121 U/L (ref 94–250)

## 2011-01-02 NOTE — Letter (Signed)
[facility]: [facility]   [facility]   Imported By: Roderic Ovens 11/04/2010 10:59:14  _____________________________________________________________________  External Attachment:    Type:   Image     Comment:   External Document

## 2011-01-02 NOTE — Miscellaneous (Signed)
Summary: Controlled Substance Contract  Controlled Substance Contract   Imported By: De Nurse 10/18/2010 16:59:31  _____________________________________________________________________  External Attachment:    Type:   Image     Comment:   External Document

## 2011-01-02 NOTE — Progress Notes (Signed)
  DDS Request received sent to [employer]. Madison [name]  October 29, 2010 8:34 AM

## 2011-01-02 NOTE — Letter (Signed)
[facility]   Imported By: Roderic Ovens 08/16/2010 11:04:23  _____________________________________________________________________  External Attachment:    Type:   Image     Comment:   External Document

## 2011-01-02 NOTE — Progress Notes (Signed)
Summary: Rx  Phone Note Call from Patient Call back at Home Phone [phone]   Reason for Call: Talk to Nurse Summary of Call: pt still waiting on rx for zoloft to be sent to pharmacy, [facility]/[place] Initial call taken by: Knox Royalty,  December 11, 2010 2:13 PM  Follow-up for Phone Call        to pcp to add to med list . I called it in to pharmacy. they do not make 150 mg. I ordered 50mg  3 at a time daily. pt notified Follow-up by: Golden Circle RN,  December 11, 2010 2:25 PM

## 2011-01-02 NOTE — Assessment & Plan Note (Signed)
Summary: cpe/pap,tcb   Vital Signs:  Patient profile:   35 year old female Height:      65 inches Weight:      223.6 pounds BMI:     37.34 Temp:     98.4 degrees F oral Pulse rate:   88 / minute BP sitting:   149 / 88 Cuff size:   large  Vitals Entered By: Garen Grams LPN (October 10, 2010 10:41 AM) CC: CPE Is Patient Diabetic? No   Primary Care Addalynne Golding:  Alvia Grove DO  CC:  CPE.  History of Present Illness: 35 yo female here for follow up of multple medical problems: 1. breast cancer:completed chemo.  Last seen by oncology on 06-28-10.  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  She had been placed on Effexr for her hot flashes back in July, but has since stopped taking it because she felt it was not helping her. Pt  is to continue her tamoxifen daily and to follow up wuth oncology every 3 months.  Plan per pt report, is to continue on Tamoxifen for 5 more years.  Pt would still like to explore the option of having a hysterectomy, but has not been able to f/u with gyn as expected due to her recent seizures.  Per pt, oncology has nothing else planned or to offer other than the daily tamoxifen.  They will see her Q3 months.  2. Seizures: Last seizure October 4th 2011. Pt was seen by neuro and her medication was increased to Keppra 1000mg  two times a day.  Pt was instructed to: not be alone, not drive, not operate heavy machines.  Follow up schedled with neuro for next month. No seizures since October 4th. 3. Depression: In part situational.  Pt currently seeing a counselor and seems to have good insight to her medical conditions. Understands her diagnosises and her likely outcomes, angry about this, but reactions are very appropriate.     Habits & Providers  Alcohol-Tobacco-Diet  Tobacco Status: current     Tobacco Counseling: to quit use of tobacco products     Cigarette Packs/Day: 1.0  Current Problems (verified): 1)  Hot Flashes  (ICD-627.2) 2)  Neoplasm, Malignant, Breast, Her2 Negative  (ICD-174.9) 3)  Mastectomy, Radical, With Axillary Lymph Nodes, Hx of  (ICD-V45.71) 4)  Insomnia  (ICD-780.52) 5)  Depression, Situational  (ICD-309.0) 6)  Abdominal Pain Right Upper Quadrant  (ICD-789.01) 7)  Other Chronic Sinusitis  (ICD-473.8) 8)  Anxiety State, Unspecified  (ICD-300.00) 9)  Breast Cancer  (ICD-174.9) 10)  Seizure Disorder  (ICD-780.39) 11)  Tobacco User  (ICD-305.1) 12)  Echocardiogram, Abnormal  (ICD-793.2)  Current Medications (verified): 1)  Keppra 1000 Mg Tabs (Levetiracetam) .... Take 1 By Mouth Bid 2)  Ativan 2 Mg Tabs (Lorazepam) .Marland Kitchen.. 1 Pill By Mouth Three Times A Day As Needed Anxiety 3)  Ranitidine Hcl 150 Mg Tabs (Ranitidine Hcl) .... Take 1 By Mouth Bid 4)  Pepcid 20 Mg Tabs (Famotidine) .... Take 1 By Mouth Two Times A Day 5)  Norvasc 10 Mg Tabs (Amlodipine Besylate) .... Take 1 By Mouth in The Am For Htn 6)  Magic Mouthwash .... Swish 15ml As Needed For Mouth Sores or Sensativites 7)  Tamoxifen Citrate 20 Mg Tabs (Tamoxifen Citrate) .... Take 1 Pill Daily 8)  Lidoderm  5 % Ptch (Lidocaine) .... Use Up To 3 Patches Every 12 Hours As Needed Pain 9)  Lunesta 3 Mg Tabs (Eszopiclone) .Marland Kitchen.. 1 Pill By Mouth Qhs  Allergies (verified): No Known Drug Allergies  Past History:  Past Medical History: Last updated: 09/10/2009 Breast CA Tobacco use Grand mal seizure  Family History: Last updated: 09/10/2009 Updated 09/10/09: Both parents alive and well. No fhs of cardiomyopathy No fm hx of breast ca  Social History: Last updated: 10/10/2010 Updated 10/10/10: Living with parents in [facility] due to cancer and inability to take care for herself or others.  Has 3 children.  24 yo has Bipolar and MR, currently in foster home.  Other 2 live  with her. Divorced  Tobacco Use - Yes  a pack per day for 20 years (quit currently) Alcohol Use - yes minimal    Risk Factors: Smoking Status: current (10/10/2010) Packs/Day: 1.0 (10/10/2010)  Past Surgical History: Appendectomy Tonsillectomy Tubal ligation Mastectomy of left breast 05/10 Cholecystectomy (march 2011)  Social History: Updated 10/10/10: Living with parents in [place] due to cancer and inability to take care for herself or others.  Has 3 children.  3 yo has Bipolar and MR, currently in foster home.  Other 2 live with her. Divorced  Tobacco Use - Yes  a pack per day for 20 years (quit currently) Alcohol Use - yes minimal  Packs/Day:  1.0  Review of Systems       see hpi  Physical Exam  General:  alert, well-developed, well-nourished, well-hydrated, and appropriate dress.   Lungs:  Normal respiratory effort, chest expands symmetrically. Lungs are clear to auscultation, no crackles or wheezes. Heart:  Normal rate and regular rhythm. S1 and S2 normal without gallop, murmur, click, rub or other extra sounds. Skin:  Intact without suspicious lesions or rashes Psych:  good eye contact, not suicidal, and not homicidal.   Angry when she discusses her dx and treatment, but not irrational.    Impression & Recommendations:  Problem # 1:  DEPRESSION, SITUATIONAL (ICD-309.0) counseling a very good course to continue down.  May benefit from CBT, referred to Dr. Pascal Lux Pt stopped Effexor herself; requesting refill of Ativan, will refill today, but pt needs to sign controlled substance contract.  Not currently worried about misuse, has no hx of, but will continue to closely monitor with daily bezo use.  Orders: [facility]- Est  Level 4 (16109)  Problem # 2:  INSOMNIA (ICD-780.52) trial of lunesta The following medications were removed from the medication list:    Ambien Cr 12.5 Mg Cr-tabs (Zolpidem tartrate) .Marland Kitchen... Take 1 at night as needed for insomnia Her updated  medication list for this problem includes:    Lunesta 3 Mg Tabs (Eszopiclone) .Marland Kitchen... 1 pill by mouth qhs  Problem # 3:  MASTECTOMY, RADICAL, WITH AXILLARY LYMPH NODES, HX OF (ICD-V45.71) coninue with tamoxifen per oncology.  Plan to f/u with oncology Q3 months.  Problem # 4:  SEIZURE DISORDER (ICD-780.39) per neuro.  increased keppra dose.  Pt to f/u with neuro in the next months.  Request information from them.  Orders: [facility]- Est  Level 4 (99214)  Complete Medication List: 1)  Keppra 1000 Mg Tabs (levetiracetam)  .... Take 1 by mouth bid 2)  Ativan 2 Mg Tabs (Lorazepam) .Marland Kitchen.. 1 pill by mouth three times a day as needed anxiety 3)  Ranitidine Hcl 150 Mg Tabs (Ranitidine hcl) .... Take 1 by mouth bid 4)  Pepcid 20 Mg Tabs (Famotidine) .Marland KitchenMarland KitchenMarland Kitchen  Take 1 by mouth two times a day 5)  Norvasc 10 Mg Tabs (Amlodipine besylate) .... Take 1 by mouth in the am for htn 6)  Magic Mouthwash  .... Swish 15ml as needed for mouth sores or sensativites 7)  Tamoxifen Citrate 20 Mg Tabs (Tamoxifen citrate) .... Take 1 pill daily 8)  Lidoderm 5 % Ptch (Lidocaine) .... Use up to 3 patches every 12 hours as needed pain 9)  Lunesta 3 Mg Tabs (Eszopiclone) .Marland Kitchen.. 1 pill by mouth qhs  Patient Instructions: 1)  Good to see you. 2)  Schedule an appt with gyn about hysterectomy 3)  You have had a lot of changes this year, it is normal to be upset and sad, try and do something for yourself.  Therapy is a great idea or get involved with volunteering.  4)   Please call me if you need anything. 5)  Follow up in 3 months or sooner if you need me. 6)  Please schedule an appointment with Dr. Pascal Lux for congnative begavioral therapy Prescriptions: LIDODERM 5 % PTCH (LIDOCAINE) use up to 3 patches every 12 hours as needed pain  #90 x 5   Entered and Authorized by:   Alvia Grove DO   Signed by:   Alvia Grove DO on 10/10/2010   Method used:   Print then Give to Patient   RxID:   [id number] ATIVAN 2 MG TABS (LORAZEPAM) 1  pill by mouth three times a day as needed anxiety  #90 x 5   Entered and Authorized by:   Alvia Grove DO   Signed by:   Alvia Grove DO on 10/10/2010   Method used:   Print then Give to Patient   RxID:   [id number]    Orders Added: 1)  [facility]- Est  Level 4 [95284]

## 2011-01-02 NOTE — Miscellaneous (Signed)
Summary: PA for Lunesta   Clinical Lists Changes  PA required for Lunesta and Lidoderm patch . Forms placed in MD box.  Theresia Lo RN  October 10, 2010 4:15 PM  received PA request again. forms were placed in MD 10/10/2010. not in box at present and cannot find that it has been faxed. will ask MD   if she has filled form out. Theresia Lo RN  October 14, 2010 4:56 PM  form filled out for [place]  and RX cancelled for Lidoderm patches since it will not be covered by [facility]. Theresia Lo RN  October 15, 2010 9:19 AM   Appended Document: PA for [facility] approval received for [facility] . pharmacy notified and they will notify patient.

## 2011-01-02 NOTE — Assessment & Plan Note (Signed)
Summary: meet new md/[facility] pt/eo   Vital Signs:  Patient profile:   35 year old female Height:      65 inches Weight:      220.5 pounds BMI:     36.83 Temp:     98.6 degrees F oral Pulse rate:   111 / minute BP sitting:   166 / 109 Cuff size:   large  Vitals Entered By: Garen Grams LPN (December 10, 2010 2:45 PM) CC: F/u BP, pain, meds Is Patient Diabetic? No Pain Assessment Patient in pain? yes     Location: all over   Referring Provider:  Pierce Crane Primary Provider:  Ardyth Gal MD  CC:  F/u BP, pain, and meds.  History of Present Illness: Pt comes in for follow up  Depression- pt says she feels bad all the time, has children that need her, but because of all her medical problems she cannot hold a job. 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 She says last time she saw Dr. Gomez Cleverly she was told she would be reffered to Dr. Pascal Lux the psychologist, but has not heard back about that.    Breast cancer- pt reports she had bilateral mastectomy, chemo and radiaiton.  She says she took Avastatin in a clinical trial and it made her blood pressure high.  She is not taking Tamoxifin and having CA 2729 monitoring.   Seizure- pt. developed seizures a few years ago.  She has had two or three seizures in the past six months.  She recently saw her neurologist who increased her Keppra to 3000 mg per day.  She is having side effects from the medicine, such as shaking.   Pain- pt. complains of hurting all over, says it almost feels like when she was taking chemo.  She describes muscle spasms, crampy pains, bone pains, joint pains.  She says once someone mentioned she might have fibromyalgia.  She says she does not like to be on narcotic pain medications but cannot stand the pain she is in.   Preventive Screening-Counseling & Management  Alcohol-Tobacco     Smoking Status: current     Packs/Day: 1.0     Tobacco Counseling: to  quit use of tobacco products  Current Medications (verified): 1)  Keppra 3000 Mg Tabs (Levetiracetam) .... Take 1 By Mouth Bid 2)  Ativan 2 Mg Tabs (Lorazepam) .Marland Kitchen.. 1 Pill By Mouth Three Times A Day As Needed Anxiety 3)  Ranitidine Hcl 150 Mg Tabs (Ranitidine Hcl) .... Take 1 By Mouth Bid 4)  Pepcid 20 Mg Tabs (Famotidine) .... Take 1 By Mouth Two Times A Day 5)  Norvasc 10 Mg Tabs (Amlodipine Besylate) .... Take 1 By Mouth in The Am For Htn 6)  Magic Mouthwash .... Swish 15ml As Needed For Mouth Sores or Sensativites 7)  Tamoxifen Citrate 20 Mg Tabs (Tamoxifen Citrate) .... Take 1 Pill Daily 8)  Lidoderm 5 % Ptch (Lidocaine) .... Use Up To 3 Patches Every 12 Hours As Needed Pain 9)  Lunesta 3 Mg Tabs (Eszopiclone) .Marland Kitchen.. 1 Pill By Mouth Qhs  Allergies (verified): No Known Drug Allergies  Review of Systems       ROS negative except HPI.  Physical Exam  General:  Anxious, shaky.  Eyes:  No corneal or conjunctival inflammation noted. EOMI. Perrla. Funduscopic exam benign, without hemorrhages, exudates or papilledema. Vision grossly normal. Mouth:  Oral mucosa and oropharynx without lesions or exudates.  Teeth in  good repair. Neck:  No deformities, masses, or tenderness noted. Lungs:  Normal respiratory effort, chest expands symmetrically. Lungs are clear to auscultation, no crackles or wheezes. Heart:  Normal rate and regular rhythm. S1 and S2 normal without gallop, murmur, click, rub or other extra sounds. Abdomen:  Bowel sounds positive,abdomen soft and non-tender without masses, organomegaly or hernias noted. Msk:  Pt. with pain on squeezing hands, strength resistance testing of Upper and lower extremities.  No strength deficits.  Extremities:  No clubbing, cyanosis, edema, or deformity noted.  Psych:  Oriented X3, tearful, and moderately anxious.     Impression & Recommendations:  Problem # 1:  GENERALIZED PAIN (ICD-780.96) Pt's pain is worse since changes in keppra made.  Have  asked her to see her neurologist as I think this is a side effect to the high dose of medication.  Explained that narcotics are unlikely to help get rid of the pain, would only mask it.  If pain not relieved with changes in keppra, will further evaluate for Fibromyalgia.  Orders: [facility]- Est  Level 4 (16109)  Problem # 2:  DEPRESSION, SITUATIONAL (ICD-309.0) Pt's depression concerning, but she is not suicidal/homicidal, and knows she has children who need her.  I have increased her Zoloft to 150 mg a day.  Gave pt. Dr. Carola Rhine card with instructions that she has to take the inititive to call to get into therapy.  Orders: [facility]- Est  Level 4 (99214)  Problem # 3:  NEOPLASM, MALIGNANT, BREAST, HER2 NEGATIVE (ICD-174.9) Pt. stable and being followed by Heme-onc.  Pt said today that she has had bilateral mastectomy, which is inconsistent with records from oncology.  Will follow up about this.   Problem # 4:  SEIZURE DISORDER (ICD-780.39) In addition to pain, pt is also visibly shakng on exam today, a likely side effect of high-dose keppra.  Have asked her to see neurology about the side effects for med adjustments.  Orders: [facility]- Est  Level 4 (99214)  Complete Medication List: 1)  Keppra 3000 Mg Tabs (levetiracetam)  .... Take 1 by mouth bid 2)  Ativan 2 Mg Tabs (Lorazepam) .Marland Kitchen.. 1 pill by mouth three times a day as needed anxiety 3)  Ranitidine Hcl 150 Mg Tabs (Ranitidine hcl) .... Take 1 by mouth bid 4)  Pepcid 20 Mg Tabs (Famotidine) .... Take 1 by mouth two times a day 5)  Norvasc 10 Mg Tabs (Amlodipine besylate) .... Take 1 by mouth in the am for htn 6)  Magic Mouthwash  .... Swish 15ml as needed for mouth sores or sensativites 7)  Tamoxifen Citrate 20 Mg Tabs (Tamoxifen citrate) .... Take 1 pill daily 8)  Lidoderm 5 % Ptch (Lidocaine) .... Use up to 3 patches every 12 hours as needed pain 9)  Lunesta 3 Mg Tabs (Eszopiclone) .Marland Kitchen.. 1 pill by mouth qhs  Patient Instructions: 1)  It was nice to  meet you.  I am sorry you are feelings bad lately.  I am concerned that your Keppra is causing too many side effects- both the shaking and the body pains.  I think you need to see your neurologist to have your medications changed.   2)  I want you to increase your zoloft dose to 150 mg by mouth every day.  You can take it all at the same time.  I have sent a perscirption to your pharmacy, but you can also take 3 of the 50 mg tabs if you still have some. 3)  I  want you to call Dr. Pascal Lux, our psychologist, to set up seeing her for therapy.  I think this could really help you feel better.   4)  Please contact the office if you have any questions or concerns.    Orders Added: 1)  [facility]- Est  Level 4 [59563]

## 2011-01-02 NOTE — Letter (Signed)
[facility]   Imported By: Roderic Ovens 06/05/2010 10:32:54  _____________________________________________________________________  External Attachment:    Type:   Image     Comment:   External Document

## 2011-01-02 NOTE — Letter (Signed)
Summary: Generic Letter  [facility]  [address]   [place], [place] [address]   Phone: [phone]  Fax: [phone]    01/13/2010  [name] [address] [place], [place]  [address]  Dear [name],    Your abdominal ultrasound was normal; there is no gallbladder wall thickening or any other worrisome findings.  If you continue to have abdominal pain, please call me and we can do some other tests and further work-up.        Sincerely,   Alvia Grove DO  Appended Document: Generic Letter mailed.

## 2011-01-02 NOTE — Letter (Signed)
[facility]   Imported By: Roderic Ovens 01/25/2010 10:47:56  _____________________________________________________________________  External Attachment:    Type:   Image     Comment:   External Document

## 2011-01-02 NOTE — Letter (Signed)
Summary: [facility] note  [facility] note   Imported By: Kassie Mends 03/25/2010 09:46:53  _____________________________________________________________________  External Attachment:    Type:   Image     Comment:   External Document

## 2011-01-02 NOTE — Assessment & Plan Note (Signed)
Summary: f/u,df   Vital Signs:  Patient profile:   35 year old female Weight:      230.0 pounds BMI:     38.41 Temp:     97.9 degrees F Pulse rate:   114 / minute BP sitting:   114 / 74  (right arm)  Vitals Entered By: Starleen Blue RN (January 04, 2010 2:09 PM) CC: f/u Is Patient Diabetic? No Pain Assessment Patient in pain? no        CC:  f/u.  History of Present Illness: 35 yo female with multiple medical issues to f/u on. 1. Breast Ca: Finished radiation last week. Going to see surgeon next week, Dr. Gaston Islam at [facility], for a ppx right mastectomy.  Also has a new lump on right back, had u/s done, which showed no blood flow. Will get bx done during right mastectomy procedure.  2.  Desire ppx hysterectomy.  Will referr to gyn to discuss this.  Pt has talked this over with oncologist and discussed with me in the past. Aware of pros and cons of proceudre. Has put much thought and research into this, will deferr to gyn's decision.   3. Seizures:  Sees neuro for this,  Dr. Scotty Court.  New onset in October 2010, could have been s/e of chemo meds. Was on Keppra two times a day for ppx since october.  Had another seizure Nov 25, 2009.  Neuro increased Keppra to three times a day. No seizures since then. To f/u with neuro in April.  Not driving until f/u with neuro. 4. RUQ pain: On and off for the past 3-4 months.  Occurs around 5-8 times per month, associated with nausea, no vomitting.  Lasts for hours at a time.  Increases with fatty, heavy foods.  Describes as a burning pain with radiation to her back. Nothing makes it better or worse.  5. Insomnia: Difficulty maintaining sleep.  Onset Ok, awaking after about 4 hours of sleep. 6. Depression: situational.  Given effexor by oncologist about 2 weeks ago, doesn't think it's really helping.  Denies SI. Considering therapy.    Habits & Providers  Alcohol-Tobacco-Diet     Tobacco Status: current     Cigarette Packs/Day: 0.5  Current Problems  (verified): 1)  Insomnia  (ICD-780.52) 2)  Depression, Situational  (ICD-309.0) 3)  Abdominal Pain Right Upper Quadrant  (ICD-789.01) 4)  Other Chronic Sinusitis  (ICD-473.8) 5)  Anxiety State, Unspecified  (ICD-300.00) 6)  Breast Cancer  (ICD-174.9) 7)  Seizure Disorder  (ICD-780.39) 8)  Tobacco User  (ICD-305.1) 9)  Echocardiogram, Abnormal  (ICD-793.2)  Current Medications (verified): 1)  Prochlorperazine Maleate 10 Mg Tabs (Prochlorperazine Maleate) .... Q6hrs As Needed 2)  Keppra 500 Mg Tabs (Levetiracetam) .... Take 1 By Mouth Tid 3)  Ativan 0.5 Mg Tabs (Lorazepam) .... Take 1-2 By Mouth Nightly For Sleep 4)  Ranitidine Hcl 150 Mg Tabs (Ranitidine Hcl) .... Take 1 By Mouth Bid 5)  Ambien Cr 12.5 Mg Cr-Tabs (Zolpidem Tartrate) .... Take 1 At Night As Needed For Insomnia 6)  Pepcid 20 Mg Tabs (Famotidine) .... Take 1 By Mouth Two Times A Day 7)  Norvasc 10 Mg Tabs (Amlodipine Besylate) .... Take 1 By Mouth in The Am For Htn 8)  Magic Mouthwash .... Swish 15ml As Needed For Mouth Sores or Sensativites 9)  Tamoxifen Citrate 20 Mg Tabs (Tamoxifen Citrate) .... Take 1 Pill Daily 10)  Aspir-Low 81 Mg Tbec (Aspirin) .... Take 1 Daily As Ppx For Blood  Clots 11)  Effexor Xr 37.5 Mg Xr24h-Cap (Venlafaxine Hcl) .... Take 1 Pill By Mouth Two Times A Day For Depression  Allergies (verified): No Known Drug Allergies  Past History:  Past Medical History: Last updated: 09/10/2009 Breast CA Tobacco use Grand mal seizure  Past Surgical History: Last updated: 09/10/2009 Appendectomy Tonsillectomy Tubal ligation Mastectomy of left breast 05/10  Family History: Last updated: 09/10/2009 Updated 09/10/09: Both parents alive and well. No fhs of cardiomyopathy No fm hx of breast ca  Social History: Last updated: 09/10/2009 Updated 09/10/09: Living with parents in [facility] due to cancer and unable to take care of herself.  3 children.  35 yo has Bipolar and MR, currently in foster  home.  Other 2 live with her. Divorced  Tobacco Use - Yes  a pack per day for 20 years Alcohol Use - yes minimal    Risk Factors: Smoking Status: current (01/04/2010) Packs/Day: 0.5 (01/04/2010)  Physical Exam  General:  alert, well-developed, well-nourished, well-hydrated, and appropriate dress.   Lungs:  Normal respiratory effort, chest expands symmetrically. Lungs are clear to auscultation, no crackles or wheezes. Heart:  Normal rate and regular rhythm. S1 and S2 normal without gallop, murmur, click, rub or other extra sounds. Abdomen:  soft, normal bowel sounds, no rebound tenderness, and RUQ tenderness, + Murphy's sign Psych:  good eye contact, not suicidal, not homicidal, and tearful, especially when talking about the past year and all of her recent medical problems.   Impression & Recommendations:  Problem # 1:  BREAST CANCER (ICD-174.9) Assessment Unchanged finished radation, to f/u with surgery next week to schedule ppx right mastectomy as well as bx of new right lump.  Will send to gyn for ? ppx hysterectomy, per pt wishes.  Asked pt to request office visits to be forwarded to me.  Orders: Gynecologic Referral (Gyn) [facility]- Est  Level 4 (16109)  Problem # 2:  SEIZURE DISORDER (ICD-780.39) Assessment: Deteriorated Keppra dose increased to three times a day per neuro.  To f/u with neuro in April Her updated medication list for this problem includes:    Keppra 500 Mg Tabs (Levetiracetam) .Marland Kitchen... Take 1 by mouth tid  Orders: [facility]- Est  Level 4 (60454)  Problem # 3:  DEPRESSION, SITUATIONAL (ICD-309.0) Assessment: New Started on effexor last week by oncologist.  Encouraged pt o take as directed and to give the medicine 4-6 weeks to see results.  Discussed need to get involved as she can, ie volunteer at daughters school, or join other activities.  Discussed need to start therapy as this past year has been increasingly difficult for pt.  Pt discouraged that she is not able to  drive and voices the need to try and get a normal life back, denies SI.    Problem # 4:  ABDOMINAL PAIN RIGHT UPPER QUADRANT (ICD-789.01) Assessment: New Started 3-4 months ago.  Concern for cholecystitis.  Will schedule u/s.  unsure if this could be a s/e from radation or chemo meds.   Orders: Ultrasound (Ultrasound) [facility]- Est  Level 4 (09811)  Problem # 5:  INSOMNIA (ICD-780.52) Assessment: New Pt currently taking ativan to help her fall asleep.  Able to fall asleep, but Unable to maintain sleep.  Will start on ambien CR for long lasting effect. discussed with pt not to take this medicne with ativan.   Her updated medication list for this problem includes:    Ambien Cr 12.5 Mg Cr-tabs (Zolpidem tartrate) .Marland Kitchen... Take 1 at night as needed  for insomnia  Problem # 6:  INSOMNIA (ICD-780.52)  Complete Medication List: 1)  Prochlorperazine Maleate 10 Mg Tabs (Prochlorperazine maleate) .... Q6hrs as needed 2)  Keppra 500 Mg Tabs (Levetiracetam) .... Take 1 by mouth tid 3)  Ativan 0.5 Mg Tabs (Lorazepam) .... Take 1-2 by mouth nightly for sleep 4)  Ranitidine Hcl 150 Mg Tabs (Ranitidine hcl) .... Take 1 by mouth bid 5)  Ambien Cr 12.5 Mg Cr-tabs (Zolpidem tartrate) .... Take 1 at night as needed for insomnia 6)  Pepcid 20 Mg Tabs (Famotidine) .... Take 1 by mouth two times a day 7)  Norvasc 10 Mg Tabs (Amlodipine besylate) .... Take 1 by mouth in the am for htn 8)  Magic Mouthwash  .... Swish 15ml as needed for mouth sores or sensativites 9)  Tamoxifen Citrate 20 Mg Tabs (Tamoxifen citrate) .... Take 1 pill daily 10)  Aspir-low 81 Mg Tbec (Aspirin) .... Take 1 daily as ppx for blood clots 11)  Effexor Xr 37.5 Mg Xr24h-cap (Venlafaxine hcl) .... Take 1 pill by mouth two times a day for depression  Patient Instructions: 1)  Good to see you. 2)  I am scheduling you for an ultrasound to evaluate your gallbladder, I will call you with the results. 3)  I will set up a Gyn referral for you to  discuss a hysterectomy 4)  Take 1 ambien at night as needed for sleep. 5)  You have had a lot of changes this year, it is normal to be upset and sad, try and do something for yourself.  Therapy is a great idea or get involved with volunteering. 6)  Please call me if you need anything. 7)  Follow up in 3 months or sooner if you need me. Prescriptions: AMBIEN CR 12.5 MG CR-TABS (ZOLPIDEM TARTRATE) take 1 at night as needed for insomnia  #30 x 5   Entered and Authorized by:   Alvia Grove DO   Signed by:   Alvia Grove DO on 01/04/2010   Method used:   Handwritten   RxID:   [id number]

## 2011-01-17 ENCOUNTER — Encounter: Payer: Self-pay | Admitting: Internal Medicine

## 2011-01-21 ENCOUNTER — Encounter: Payer: Self-pay | Admitting: Cardiovascular Disease

## 2011-01-21 ENCOUNTER — Encounter: Payer: Self-pay | Admitting: Internal Medicine

## 2011-01-21 ENCOUNTER — Ambulatory Visit (INDEPENDENT_AMBULATORY_CARE_PROVIDER_SITE_OTHER): Payer: Medicaid Other | Admitting: Internal Medicine

## 2011-01-21 DIAGNOSIS — R9431 Abnormal electrocardiogram [ECG] [EKG]: Secondary | ICD-10-CM | POA: Insufficient documentation

## 2011-01-21 DIAGNOSIS — Z01810 Encounter for preprocedural cardiovascular examination: Secondary | ICD-10-CM

## 2011-01-28 NOTE — Assessment & Plan Note (Signed)
Summary: Surgical Clearance for Hyst/AMD   Visit Type:  Initial Consult Referring Provider:  Osborn Coho Primary Provider:  Ardyth Gal MD  CC:  Surgical Clearance. c/o chest pain due to mastectomy. denies SOB and palpitations..  History of Present Illness: Madison [name] is a 35 y/o woman with history of breast CA, morbid obesity, anxiety/depession, seizure d/o, fibromyalgia and ongoing tobacco use. Here for presurgical evaluation prior to hysterectomy.   I saw her back in 2010 for pre-chemo evaluation. MUGA showed EF 53%. F/u ECHO EF 55% with no regional wall motion abnormalities. BNP normal. Cleared for chemo. I have not seen her since 2010. Chemo/XRT finished January 2011. s/p L mastectomy (radical) in 5/10 and R mastectomy 3/11.  Has had f/u imaging studies during chemo and last echo 6/11 EF 65%.  Previously underwent EP procedure by Dr. 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 Post procedure c/b venous clot. On coumadin x 3 months.   Not tolerating Tamoxifen well so want to proceed with TAH/BSO to remove estrogen stimulus for breast cancer. Very stressed and depressed. Despite all medical issues remains fairly active. Had an episode of chest pain which she relates to her mastectomy. Unable to raise L arm over her head.  No exertional CP or dyspnea. Walks her dog and goes up and down steps to her 2nd floor apt without problem.  Occasional mild edema in hands and feet. No orthopnea, PND. Tolerated mastectomies in 3/11 and 5/11 without any problem.    Problems Prior to Update: 1)  Hot Flashes  (ICD-627.2) 2)  Neoplasm, Malignant, Breast, Her2 Negative  (ICD-174.9) 3)  Mastectomy, Radical, With Axillary Lymph Nodes, Hx of  (ICD-V45.71) 4)  Insomnia  (ICD-780.52) 5)  Depression, Situational  (ICD-309.0) 6)  Abdominal Pain Right Upper Quadrant  (ICD-789.01) 7)  Other Chronic Sinusitis  (ICD-473.8) 8)  Anxiety State, Unspecified  (ICD-300.00) 9)  Breast Cancer   (ICD-174.9) 10)  Seizure Disorder  (ICD-780.39) 11)  Tobacco User  (ICD-305.1) 12)  Echocardiogram, Abnormal  (ICD-793.2)  Medications Prior to Update: 1)  Keppra 3000 Mg Tabs (Levetiracetam) .... Take 1 By Mouth Bid 2)  Ativan 2 Mg Tabs (Lorazepam) .Marland Kitchen.. 1 Pill By Mouth Three Times A Day As Needed Anxiety 3)  Ranitidine Hcl 150 Mg Tabs (Ranitidine Hcl) .... Take 1 By Mouth Bid 4)  Pepcid 20 Mg Tabs (Famotidine) .... Take 1 By Mouth Two Times A Day 5)  Norvasc 10 Mg Tabs (Amlodipine Besylate) .... Take 1 By Mouth in The Am For Htn 6)  Magic Mouthwash .... Swish 15ml As Needed For Mouth Sores or Sensativites 7)  Tamoxifen Citrate 20 Mg Tabs (Tamoxifen Citrate) .... Take 1 Pill Daily 8)  Lidoderm 5 % Ptch (Lidocaine) .... Use Up To 3 Patches Every 12 Hours As Needed Pain 9)  Lunesta 3 Mg Tabs (Eszopiclone) .Marland Kitchen.. 1 Pill By Mouth Qhs  Current Medications (verified): 1)  Keppra 3000 Mg Tabs (Levetiracetam) .... Take 1 By Mouth Bid 2)  Ativan 2 Mg Tabs (Lorazepam) .Marland Kitchen.. 1 Pill By Mouth Three Times A Day As Needed Anxiety 3)  Ranitidine Hcl 150 Mg Tabs (Ranitidine Hcl) .... Take 1 By Mouth Bid 4)  Pepcid 20 Mg Tabs (Famotidine) .... Take 1 By Mouth Two Times A Day 5)  Norvasc 10 Mg Tabs (Amlodipine Besylate) .... Take 1 By Mouth in The Am For Htn 6)  Tamoxifen Citrate 20 Mg Tabs (Tamoxifen Citrate) .... Take 1 Pill Daily 7)  Lidoderm 5 % Ptch (Lidocaine) .Marland KitchenMarland KitchenMarland Kitchen  Use Up To 3 Patches Every 12 Hours As Needed Pain 8)  Lunesta 3 Mg Tabs (Eszopiclone) .Marland Kitchen.. 1 Pill By Mouth Qhs 9)  Zoloft 150 Mg Tabs (Sertraline Hcl) .Marland Kitchen.. 1 Tablet Once Daily 10)  Vinpat 50mg  .... 1 Tablet Two Times A Day 11)  Prevident 5000 Sensitive 1.1-5 % Pste (Sod Fluoride-Potassium Nitrate) .... Two Times A Day  Allergies (verified): No Known Drug Allergies  Past History:  Past Medical History: Last updated: 09/10/2009 Breast CA Tobacco use Grand mal seizure  Past Surgical History: Last updated:  10/10/2010 Appendectomy Tonsillectomy Tubal ligation Mastectomy of left breast 05/10 Cholecystectomy (march 2011)  Family History: Last updated: 09/10/2009 Updated 09/10/09: Both parents alive and well. No fhs of cardiomyopathy No fm hx of breast ca  Social History: Last updated: 01/21/2011 Updated 10/10/10: Living with parents in [facility] due to cancer and inability to take care for herself or others.  Has 3 children.  21 yo has Bipolar and MR, currently in foster home.  Other 2 live with her. Divorced  Tobacco Use - Yes  a pack per day for 20 years Alcohol Use - yes minimal    Risk Factors: Smoking Status: current (12/10/2010) Packs/Day: 1.0 (12/10/2010)  Family History: Reviewed history from 09/10/2009 and no changes required. Updated 09/10/09: Both parents alive and well. No fhs of cardiomyopathy No fm hx of breast ca  Social History: Reviewed history from 10/10/2010 and no changes required. Updated 10/10/10: Living with parents in [facility] due to cancer and inability to take care for herself or others.  Has 3 children.  87 yo has Bipolar and MR, currently in foster home.  Other 2 live with her. Divorced  Tobacco Use - Yes  a pack per day for 20 years Alcohol Use - yes minimal    Review of Systems       As per HPI and past medical history; otherwise all systems negative.   Vital Signs:  Patient profile:   35 year old female Height:      65 inches Weight:      224.75 pounds BMI:     37.54 Pulse rate:   80 / minute BP sitting:   134 / 68 Cuff size:   large  Vitals Entered By: Lysbeth Galas CMA (January 21, 2011 3:40 PM)  Physical Exam  General:  Gen: well appearing. no resp difficulty HEENT: normal Neck: supple. no JVD. Carotids 2+ bilat; no bruits. No lymphadenopathy or thryomegaly appreciated. Chest: left mastectomy Cor: PMI nondisplaced. Regular rate & rhythm. No rubs, gallops, murmur. Lungs: clear Abdomen: soft, nontender, nondistended. No  hepatosplenomegaly. No bruits or masses. Good bowel sounds. Extremities: no cyanosis, clubbing, rash, edema Neuro: alert & orientedx3, cranial nerves grossly intact. moves all 4 extremities w/o difficulty. affect pleasant    Impression & Recommendations:  Problem # 1:  PRE-OPERATIVE CARDIAC EXAM (ICD-V72.81) Based on her age, preserved functional capacity and ability to tolerate recent major surgeries, I feel she is at low-risk for peri-operative cardiac complications and can proceed with hysterctomy without any further cardiac testing. Please do not hesitate to call us as needed while she is in the hospital.   Problem # 2:  ABNORMAL EKG (ICD-794.31) No evidence of pre-exciation.   Other Orders: EKG w/ Interpretation (93000)

## 2011-01-30 HISTORY — PX: ABDOMINAL HYSTERECTOMY: SHX81

## 2011-02-03 ENCOUNTER — Encounter (HOSPITAL_COMMUNITY)
Admission: RE | Admit: 2011-02-03 | Discharge: 2011-02-03 | Disposition: A | Discharge status: Home / Self Care | Payer: Medicaid Other | Source: Ambulatory Visit | Attending: Obstetrics and Gynecology | Admitting: Obstetrics and Gynecology

## 2011-02-03 DIAGNOSIS — Z01812 Encounter for preprocedural laboratory examination: Secondary | ICD-10-CM | POA: Insufficient documentation

## 2011-02-03 DIAGNOSIS — Z01818 Encounter for other preprocedural examination: Secondary | ICD-10-CM | POA: Insufficient documentation

## 2011-02-03 LAB — BASIC METABOLIC PANEL
BUN: 10 mg/dL (ref 6–23)
Calcium: 8.9 mg/dL (ref 8.4–10.5)
Creatinine, Ser: 0.74 mg/dL (ref 0.4–1.2)
GFR calc non Af Amer: >60 mL/min (ref >60)
Glucose, Bld: 74 mg/dL (ref 70–99)
Potassium: 4.4 mEq/L (ref 3.5–5.1)

## 2011-02-03 LAB — CBC
MCH: 26.2 pg (ref 26.0–34.0)
MCHC: 31 g/dL (ref 30.0–36.0)
Platelets: 192 10*3/uL (ref 150–400)
RDW: 14.6 % (ref 11.5–15.5)

## 2011-02-03 LAB — SURGICAL PCR SCREEN
MRSA, PCR: NEGATIVE
Staphylococcus aureus: NEGATIVE

## 2011-02-04 ENCOUNTER — Other Ambulatory Visit (HOSPITAL_COMMUNITY): Payer: Self-pay | Admitting: Anesthesiology

## 2011-02-04 DIAGNOSIS — C50919 Malignant neoplasm of unspecified site of unspecified female breast: Secondary | ICD-10-CM

## 2011-02-07 ENCOUNTER — Ambulatory Visit (HOSPITAL_COMMUNITY)
Admission: RE | Admit: 2011-02-07 | Discharge: 2011-02-07 | Disposition: A | Discharge status: Home / Self Care | Payer: Medicaid Other | Source: Ambulatory Visit | Attending: Anesthesiology | Admitting: Anesthesiology

## 2011-02-07 DIAGNOSIS — Z01818 Encounter for other preprocedural examination: Secondary | ICD-10-CM | POA: Insufficient documentation

## 2011-02-07 DIAGNOSIS — C50919 Malignant neoplasm of unspecified site of unspecified female breast: Secondary | ICD-10-CM

## 2011-02-07 DIAGNOSIS — Z853 Personal history of malignant neoplasm of breast: Secondary | ICD-10-CM | POA: Insufficient documentation

## 2011-02-07 MED ORDER — IOHEXOL 300 MG/ML  SOLN: 10.0000 mL | Freq: Once | INTRAMUSCULAR | Status: AC | PRN

## 2011-02-11 NOTE — Letter (Signed)
Summary: [facility] - Diagnosis Info and Medication  [facility] - Diagnosis Info and Medication   Imported By: Earl Many 02/05/2011 09:33:17  _____________________________________________________________________  External Attachment:    Type:   Image     Comment:   External Document

## 2011-02-11 NOTE — Letter (Signed)
Summary: [facility]: Surgical Clearance  [facility]: Surgical Clearance   Imported By: Earl Many 02/05/2011 09:53:05  _____________________________________________________________________  External Attachment:    Type:   Image     Comment:   External Document

## 2011-02-17 ENCOUNTER — Ambulatory Visit (HOSPITAL_COMMUNITY)
Admission: RE | Admit: 2011-02-17 | Discharge: 2011-02-18 | Disposition: A | Discharge status: Home / Self Care | Payer: Medicaid Other | Source: Ambulatory Visit | Attending: Obstetrics and Gynecology | Admitting: Obstetrics and Gynecology

## 2011-02-17 ENCOUNTER — Other Ambulatory Visit: Payer: Self-pay | Admitting: Obstetrics and Gynecology

## 2011-02-17 DIAGNOSIS — N80109 Endometriosis of ovary, unspecified side, unspecified depth: Secondary | ICD-10-CM | POA: Insufficient documentation

## 2011-02-17 DIAGNOSIS — N8300 Follicular cyst of ovary, unspecified side: Secondary | ICD-10-CM | POA: Insufficient documentation

## 2011-02-17 DIAGNOSIS — N8000 Endometriosis of the uterus, unspecified: Secondary | ICD-10-CM | POA: Insufficient documentation

## 2011-02-17 DIAGNOSIS — D251 Intramural leiomyoma of uterus: Secondary | ICD-10-CM | POA: Insufficient documentation

## 2011-02-17 DIAGNOSIS — N920 Excessive and frequent menstruation with regular cycle: Secondary | ICD-10-CM | POA: Insufficient documentation

## 2011-02-17 DIAGNOSIS — D252 Subserosal leiomyoma of uterus: Secondary | ICD-10-CM | POA: Insufficient documentation

## 2011-02-17 DIAGNOSIS — Z01812 Encounter for preprocedural laboratory examination: Secondary | ICD-10-CM | POA: Insufficient documentation

## 2011-02-17 DIAGNOSIS — N840 Polyp of corpus uteri: Secondary | ICD-10-CM | POA: Insufficient documentation

## 2011-02-17 DIAGNOSIS — D250 Submucous leiomyoma of uterus: Secondary | ICD-10-CM | POA: Insufficient documentation

## 2011-02-17 DIAGNOSIS — N801 Endometriosis of ovary: Secondary | ICD-10-CM | POA: Insufficient documentation

## 2011-02-17 DIAGNOSIS — Z853 Personal history of malignant neoplasm of breast: Secondary | ICD-10-CM | POA: Insufficient documentation

## 2011-02-18 LAB — CBC
HCT: 27 % — ABNORMAL LOW (ref 36.0–46.0)
MCH: 25.9 pg — ABNORMAL LOW (ref 26.0–34.0)
MCV: 85.2 fL (ref 78.0–100.0)
RBC: 3.17 MIL/uL — ABNORMAL LOW (ref 3.87–5.11)
WBC: 11.2 10*3/uL — ABNORMAL HIGH (ref 4.0–10.5)

## 2011-02-19 ENCOUNTER — Other Ambulatory Visit (HOSPITAL_COMMUNITY): Payer: Self-pay | Admitting: Interventional Radiology

## 2011-02-19 ENCOUNTER — Ambulatory Visit: Payer: Self-pay | Admitting: Internal Medicine

## 2011-02-19 DIAGNOSIS — C50919 Malignant neoplasm of unspecified site of unspecified female breast: Secondary | ICD-10-CM

## 2011-02-24 LAB — DIFFERENTIAL
Basophils Relative: 1 % (ref 0–1)
Lymphocytes Relative: 18 % (ref 12–46)
Lymphs Abs: 1.4 10*3/uL (ref 0.7–4.0)
Monocytes Absolute: 0.6 10*3/uL (ref 0.1–1.0)
Monocytes Relative: 8 % (ref 3–12)
Neutro Abs: 5.4 10*3/uL (ref 1.7–7.7)
Neutrophils Relative %: 68 % (ref 43–77)

## 2011-02-24 LAB — CBC
HCT: 42.7 % (ref 36.0–46.0)
Hemoglobin: 14.8 g/dL (ref 12.0–15.0)
MCHC: 34.6 g/dL (ref 30.0–36.0)
MCV: 89.5 fL (ref 78.0–100.0)
Platelets: 240 10*3/uL (ref 150–400)
RDW: 13.4 % (ref 11.5–15.5)

## 2011-02-24 LAB — COMPREHENSIVE METABOLIC PANEL
Albumin: 3.5 g/dL (ref 3.5–5.2)
Alkaline Phosphatase: 59 U/L (ref 39–117)
BUN: 7 mg/dL (ref 6–23)
Calcium: 9.4 mg/dL (ref 8.4–10.5)
Creatinine, Ser: 0.71 mg/dL (ref 0.4–1.2)
Glucose, Bld: 89 mg/dL (ref 70–99)
Total Protein: 7 g/dL (ref 6.0–8.3)

## 2011-02-24 LAB — PREGNANCY, URINE: Preg Test, Ur: NEGATIVE

## 2011-02-26 ENCOUNTER — Inpatient Hospital Stay (HOSPITAL_COMMUNITY)
Admission: RE | Admit: 2011-02-26 | Discharge: 2011-02-27 | DRG: 101 | Disposition: A | Discharge status: Home / Self Care | Payer: Medicaid Other | Source: Other Acute Inpatient Hospital | Attending: Neurology | Admitting: Neurology

## 2011-02-26 DIAGNOSIS — I10 Essential (primary) hypertension: Secondary | ICD-10-CM | POA: Diagnosis present

## 2011-02-26 DIAGNOSIS — K279 Peptic ulcer, site unspecified, unspecified as acute or chronic, without hemorrhage or perforation: Secondary | ICD-10-CM | POA: Diagnosis present

## 2011-02-26 DIAGNOSIS — F17200 Nicotine dependence, unspecified, uncomplicated: Secondary | ICD-10-CM | POA: Diagnosis present

## 2011-02-26 DIAGNOSIS — E669 Obesity, unspecified: Secondary | ICD-10-CM | POA: Diagnosis present

## 2011-02-26 DIAGNOSIS — F329 Major depressive disorder, single episode, unspecified: Secondary | ICD-10-CM | POA: Diagnosis present

## 2011-02-26 DIAGNOSIS — F411 Generalized anxiety disorder: Secondary | ICD-10-CM | POA: Diagnosis present

## 2011-02-26 DIAGNOSIS — I456 Pre-excitation syndrome: Secondary | ICD-10-CM | POA: Diagnosis present

## 2011-02-26 DIAGNOSIS — G43909 Migraine, unspecified, not intractable, without status migrainosus: Secondary | ICD-10-CM | POA: Diagnosis present

## 2011-02-26 DIAGNOSIS — F3289 Other specified depressive episodes: Secondary | ICD-10-CM | POA: Diagnosis present

## 2011-02-26 DIAGNOSIS — IMO0001 Reserved for inherently not codable concepts without codable children: Secondary | ICD-10-CM | POA: Diagnosis present

## 2011-02-26 DIAGNOSIS — Z853 Personal history of malignant neoplasm of breast: Secondary | ICD-10-CM

## 2011-02-26 DIAGNOSIS — G40909 Epilepsy, unspecified, not intractable, without status epilepticus: Principal | ICD-10-CM | POA: Diagnosis present

## 2011-02-26 LAB — CBC
HCT: 29.3 % — ABNORMAL LOW (ref 36.0–46.0)
RBC: 3.58 MIL/uL — ABNORMAL LOW (ref 3.87–5.11)
RDW: 14.1 % (ref 11.5–15.5)
WBC: 12 10*3/uL — ABNORMAL HIGH (ref 4.0–10.5)

## 2011-02-26 LAB — COMPREHENSIVE METABOLIC PANEL
AST: 19 U/L (ref 0–37)
BUN: 10 mg/dL (ref 6–23)
CO2: 24 mEq/L (ref 19–32)
Calcium: 8.3 mg/dL — ABNORMAL LOW (ref 8.4–10.5)
Creatinine, Ser: 0.68 mg/dL (ref 0.4–1.2)
GFR calc non Af Amer: >60 mL/min (ref >60)
Potassium: 3.7 mEq/L (ref 3.5–5.1)
Sodium: 138 mEq/L (ref 135–145)

## 2011-02-27 ENCOUNTER — Other Ambulatory Visit (HOSPITAL_COMMUNITY): Payer: Medicaid Other

## 2011-02-27 NOTE — Op Note (Signed)
NAMEANNALICIA, Madison [name]             ACCOUNT NO.:  [id number]  MEDICAL RECORD NO.:  [id number]           PATIENT TYPE:  O  LOCATION:  9305                          FACILITY:  [facility]  PHYSICIAN:  Osborn Coho, M.D.   DATE OF BIRTH:  Feb 04, 1976  DATE OF PROCEDURE: DATE OF DISCHARGE:                              OPERATIVE REPORT   PREOPERATIVE DIAGNOSES: 1. Menorrhagia. 2. Symptomatic fibroids. 3. Endometrial mass. 4. Status post breast cancer.  POSTOPERATIVE DIAGNOSES: 1. Menorrhagia. 2. Symptomatic fibroids. 3. Endometrial mass. 4. Status post breast cancer.  PROCEDURES: 1. Total laparoscopic hysterectomy. 2. Bilateral salpingo-oophorectomy. 3. Cystoscopy.  ATTENDING SURGEON:  Osborn Coho, MD  ASSISTANT:  Naima A. Dillard, MD  ANESTHESIA:  General.  SPECIMENS TO PATHOLOGY:  Uterus and cervix weighing 300.3 g.  FLUIDS:  2600 mL.  URINE OUTPUT:  325 mL.  ESTIMATED BLOOD LOSS:  300 mL.  COMPLICATIONS:  None.PROCEDURE:  The patient was taken to the operating room.  After risks, benefits and alternatives discussed with the patient, the patient verbalized understanding, consent signed and witnessed.  The patient was placed under general per anesthesia and prepped and draped in a normal sterile fashion in the dorsal lithotomy position.  A weighted speculum was placed in the patient's vagina and the Deaver retractor was placed for anterior vaginal wall retraction.  The cervix was grasped with single-tooth tenaculum on the anterior lip and the uterus sounded to 10.5 cm.  The size 10 probe was used along with the size 4.0-cm KOH ring, which was then placed into the cervix and uterus and the tip balloon insufflated with 8 mL of normal saline.  The KOH ring was then placed on the cervix without difficulty and the occluder balloon insufflated with 60 mL of normal saline.  The weighted speculum and the Deaver blade were removed and after gowning and re-gloving,  attention was then turned to the abdomen where at the umbilicus, a 10-mm incision was made after injecting 0.25% Marcaine.  The incision was taken down to the fascia and the peritoneum and a pursestring stitch of 0 Vicryl was placed in the fascia and the Hasson placed as well.  Laparoscope was introduced and pneumoperitoneum achieved.  Small amount of adhesions of the bowel to the right adnexa.  The Kleppinger was then used after identifying the bilateral ureters and noted them to peristalsis without difficulty.  The Kleppinger was used on the right infundibulopelvic ligament, which was cauterized sequentially approximately three burns and then cauterized and cut with the Harmonic down to level of the round ligament.  The bladder flap was then created and the anterior cul-de-sac entered at the level of the KOH ring.  The uterine vessel on that side was then taken with the Harmonic as well down to the level of the KOH ring.  Attention was then turned to the contralateral side where the same was done.  The KOH ring was then completely circumscribed and the uterus removed through the vagina and left in the vaginal wall while the vaginal cuff was repaired with 1 PDS.  The vaginal cuff was repaired with several interrupted stitches.  The pneumoperitoneum was  then relieved and cystoscopy performed after indigo carmine administered. The bilateral ureters were noted to be efflux without difficulty.  There were no inadvertent bladder injuries noted.  The Foley was replaced to gravity and the vaginal cuff was inspected and good closure was noted. A hemostatic clip was placed on the vessel after returning to the intra- abdominal cavity.  It was placed on the right vessel near the right angle of the cuff which was noted to be oozing a small amount.  Copious irrigation was performed and good hemostasis was noted.  All pedicles were visualized.  The fascial closure device was used to pass the 0 Vicryl  suture through the fascia at the 10-mm right lower quadrant 10-mm port.  The ports were made after the Hasson had been placed and a 5-mm port was placed in the right lower quadrant and left lower quadrant as well as a 10-mm port in the right lower quadrant.  The 0 Vicryl stitch in the right lower quadrant 10-mm port was tied and all trocars were watched and removed and noted to be hemostatic.  The umbilical trocar was then removed under direct visualization as well.  The pursestring stitch was tied.  The 10-mm umbilical incisions were repaired with 3-0 Monocryl via subcuticular stitches and Dermabond was applied to the two 5-mm incisions as well.  Sponge, lap, and needle count was correct.  The patient tolerated the procedure well and was awaiting transfer to the recovery room in good condition.     Osborn Coho, M.D.     AR/MEDQ  D:  02/17/2011  T:  02/18/2011  Job:  [id number]  Electronically Signed by Osborn Coho M.D. on 02/27/2011 09:51:13 AM

## 2011-02-27 NOTE — H&P (Signed)
[name], [name] NO.:  [id number]  MEDICAL RECORD NO.:  [id number]         PATIENT TYPE:  WAMB  LOCATION:                                FACILITY:  [facility]  PHYSICIAN:  Osborn Coho, M.D.   DATE OF BIRTH:  08-Dec-1975  DATE OF ADMISSION:  02/17/2011 DATE OF DISCHARGE:                             HISTORY & PHYSICAL   HISTORY OF PRESENT ILLNESS:  Madison [name] is a 35 year old, separated, white female, para 1-2-3-2, who is status post bilateral mastectomy because of breast cancer, presenting for hysterectomy with bilateral salpingo-oophorectomy because of menorrhagia, endometrial polyps, uterine fibroids, and side effects of tamoxifen therapy.  Since 1999, the patient reports she has had increasing premenstrual symptoms along with vaginal bleeding that will last 7-10 days and require her to change the pad with a tampon every 30 to 90 minutes.  The patient will go through 48 overnight pads during  a single menstrual episode. Additionally, she has cramps that she rates as a 7/10 on a 10-point pain scale.  However, she has chosen only to use heating pads for comfort.  There have been times that the patient will bleed, unexpectedly, for 5-7 days with the first day being very heavy, causing frequent pad change, but then tapering to a pad change every 2-3 hours. She will have cramps with those occasional bleeding episodes also.  She admits to loose bowel movements and vaginal dryness sensation during her vaginal bleeding, however, denies any dyspareunia, urinary tract symptoms, or vaginitis symptoms.  The patient had an endometrial biopsy performed, which did not show any atypia, hyperplasia, or malignancy.  A pelvic ultrasound showed a uterus measuring 11.4 x 6.90 x 6.40 with an endometrial mass measuring 1.61 x 0.9 x 4 cm.  The patient was also observed to have a midbody uterine fibroid with a submucosal component measuring 3.16 x 3.13 x 2.56 cm and a posterior  fundal subserosal fibroid measuring 3.06 x 2.76 x 2.78 cm.  The patient's ovaries appeared normal on that ultrasound and a small amount of fluid was seen within the pelvic cavity.  Given the patient's history of breast cancer and her negative side effects to tamoxifen, she along with her oncologist have agreed that the best course of action would be for her to undergo a total hysterectomy with bilateral salpingo-oophorectomy.  OB HISTORY:  Gravida 5, para 1-2-3-2.  The patient had a 10 pounds 6 ounces baby in 1993 and 7 pounds 5 ounces baby in 1999 (the patient had a stillbirth with findings consistent with a cystic hygroma, Turner syndrome, and Down syndrome).  GYN HISTORY:  Menarche 35 years old.  Last period, January 27, 2011. The patient uses laparoscopic tubal cautery as a method of contraception.  She denies any history of sexually transmitted diseases or abnormal Pap smears.  Last Pap smear was in 2012.  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  The patient underwent mastectomy, chemotherapy, radiation, and hormonal therapies as a result of this diagnosis.  She further has a history of epilepsy, hypertension, migraines, depression,  fibromyalgia, Wolff-Parkinson-White, pulmonary embolus following a cardiac catheterization in 1995, peptic ulcer disease, and anemia.  SURGICAL HISTORY:  In 1990s, D and C; 1998, D and C; 1998, appendectomy; 2000, laparoscopic tubal cautery; 2010, bilateral mastectomy; 2011, cholecystectomy.  The patient denies any history of blood transfusions. She does report that she has severe nausea and vomiting with anesthesia, and that when she has aroused from anesthesia, she is extremely emotional.  FAMILY HISTORY:  Diabetes, cardiovascular disease, migraines.  SOCIAL HISTORY:  The patient is separated and unemployed.  HABITS:  She smokes one  pack of cigarettes per day.  She occasionally consumes alcohol.  She does not use illicit drugs.  CURRENT MEDICATIONS: 1. Keppra 3000 mg daily. 2. Ativan 2 mg 3 times a day as needed. 3. Ranitidine 150 mg daily. 4. Pepcid 20 mg twice daily. 5. Norvasc 10 mg every morning. 6. Tamoxifen 20 mg daily. 7. Lidoderm 5% patch up to 3 every 12 hours as needed. 8. Lunesta 3 mg at bedtime as needed. 9. Zoloft 150 mg daily. 10.Vimpat 50 mg 2 times a day. 11.PreviDent 5000 sensitive 1.1-5% paste twice daily.  ALLERGIES:  The patient is allergic to TRAMADOL that causes itching and hives.  TAXOL, anaphylaxis.  She has been advised to avoid IBUPROFEN due to her peptic ulcer disease, and ADHESIVES causes her hives and a rash. She denies any sensitivities to latex, peanuts, shellfish, or soy.  REVIEW OF SYSTEMS:  The patient has restriction on bilateral arm use (i.e. no needles or blood pressure cuff on either arm), must use foot and/or calf for these procedures due to her history of bilateral mastectomy, the patient has in her right upper chest a tunneled PIC line for intravenous access.  The patient does wear corrective lenses.  She denies any headaches, vision changes, chest pain, shortness of breath. She occasionally will have lightheadedness due to her seizure medications, occasional constipation along with myalgias and arthralgias due to fibromyalgia.  She denies any dysphagia, any ringing in her ears, any night sweats, sudden weight loss, and except as is mentioned in history of present illness.  The patient's review of systems is otherwise negative.  PHYSICAL EXAM:  VITAL SIGNS:  Blood pressure is 130/82 (thigh blood pressure), pulse is 82, respirations 16, temperature 97.6 degrees Fahrenheit orally.  Weight 232 pounds, height 5 feet 5 inches tall. Body mass index is 30. NECK:  Supple without masses.  There is no thyromegaly or cervical adenopathy. HEART:  Regular rate and  rhythm. LUNGS:  Clear. BACK:  No CVA tenderness. ABDOMEN:  No tenderness, masses, or organomegaly. EXTREMITIES:  No clubbing, cyanosis, or edema.  The patient has bilateral arm restrictions (no IV access or blood pressure measurements). PELVIC:  EG/BUS is normal.  Vagina is normal.  Cervix is nontender without lesions.  Uterus is normal size, shape, and consistency without tenderness, though exam is limited by habitus.  Adnexa without tenderness or masses.  IMPRESSION: 1. Menorrhagia. 2. Symptomatic uterine fibroids. 3. Endometrial polyps. 4. Status post breast cancer.  DISPOSITION:  A discussion was held with the patient regarding the indications for her procedures along with their risks which include but are not limited to reaction to anesthesia, damage to adjacent organs, infection, and bleeding.  The patient was given a MiraLax bowel prep to be completed 24 hours prior to her procedure.  The patient has consented to proceed with a total laparoscopic hysterectomy with bilateral salpingo-oophorectomy with the possibility of a laparoscopically- assisted vaginal hysterectomy with bilateral salpingo-oophorectomy, and  the possibility of a total abdominal hysterectomy with bilateral salpingo-oophorectomy at [facility] on February 17, 2011 at 9:30 a.m.     Elmira J. Lowell Guitar, P.A.-C   ______________________________ Osborn Coho, M.D.    EJP/MEDQ  D:  02/12/2011  T:  02/13/2011  Job:  [id number]  Electronically Signed by Raylene Everts. on 02/16/2011 08:55:57 PM Electronically Signed by Osborn Coho M.D. on 02/27/2011 09:51:07 AM

## 2011-03-04 NOTE — Discharge Summary (Signed)
NAMEGIDGET, QUIZHPI             ACCOUNT NO.:  [id number]  MEDICAL RECORD NO.:  [id number]           PATIENT TYPE:  I  LOCATION:  3016                         FACILITY:  [facility]  PHYSICIAN:  Madison [name], M.D.  DATE OF BIRTH:  14-Jun-1976  DATE OF ADMISSION:  02/26/2011 DATE OF DISCHARGE:  02/27/2011                              DISCHARGE SUMMARY   ADMISSION DIAGNOSES: 1. History of chronic seizure disorder with recent recurrence. 2. Breast cancer status post bilateral mastectomies. 3. Recent hysterectomy. 4. Hypertension. 5. Peptic ulcer disease. 6. Depression and anxiety.  DISCHARGE DIAGNOSES: 1. History of seizures with recent recurrence. 2. History of breast cancer. 3. Hypertension. 4. Peptic ulcer disease. 5. Depression and anxiety. 6. Migraine headache.  PROCEDURES DURING THIS ADMISSION:  None.  COMPLICATIONS:  None.  HISTORY OF PRESENT ILLNESS:  Madison [name] is a 35 year old white female born on 17-Sep-1976 with a history of seizures.  This patient had just undergone a hysterectomy on February 17, 2011.  The patient had returned home and suffered 4 generalized seizures on the day of admission.  The patient went to [facility] regional emergency room and was stabilized there.  The patient underwent a CT scan that was unremarkable.  The patient had been on Keppra at 1000 mg twice daily and Vimpat 50 mg twice daily.  The patient was transferred to [facility] for further management.  The patient has had no seizures since transfer.  PAST MEDICAL HISTORY: 1. History of seizures with recent recurrence. 2. Breast cancer status post bilateral mastectomies. 3. Wolff-Parkinson-White syndrome. 4. Migraine headache. 5. Depression and anxiety. 6. Appendectomy. 7. Bilateral tubal ligation. 8. Tonsillectomy. 9. Fibromyalgia. 10.Gallbladder resection. 11.Hysterectomy. 12.Peptic ulcer disease. 13.Hypertension. 14.Obesity.  MEDICATIONS PRIOR TO  ADMISSION: 1. Tamoxifen 20 mg daily. 2. Zoloft 150 mg daily. 3. Ativan 2 mg 3 times a day as needed. 4. Ranitidine 150 mg daily or Pepcid 20 mg daily. 5. Norvasc 10 mg daily. 6. Lunesta 3 mg at night if needed. 7. Vimpat 50 mg twice daily. 8. Keppra 1000 mg twice daily.  HABITS:  The patient smokes half-pack cigarettes daily.  Drinks alcohol on occasion.  ALLERGIES:  He has an allergy to ULTRAM and TAXOL.  Please refer to history and physical dictation summary for social history, family history, review of systems, and physical examination.  LABORATORY VALUES:  During this admission include a white count of 12.0, hemoglobin of 9.1, hematocrit of 29.3, and platelets of 329.  Sodium 138, potassium 3.7, chloride 111, CO2 24, glucose 104, BUN 10, and creatinine 0.68.  The patient has an alk phosphatase of 56, SGOT of 19, SGPT of 10, total protein 6.2, albumin 2.9, and calcium 8.3.  HOSPITAL COURSE:  This patient was admitted to [facility].  The patient received IV fluids and was increased on the Vimpat taking 100 mg twice daily and increased on the Keppra taking 1500 mg twice daily.  The patient has not had any further seizures since admission.  The patient did bite her tongue with the seizures and the tongue is somewhat swollen.  The patient is not able to eat  solid foods well because of this but he is able to drink.  The patient at this point will be discharged to home taking Norvasc 10 mg daily and Pepcid 20 mg daily. He will go on Vimpat taking 100 mg twice daily.  The patient will have viscous lidocaine for her lacerated tongue at home, 150 mg of Zoloft daily, tamoxifen 20 mg daily, Ambien or Lunesta 3 mg at night, and levetiracetam 1500 mg twice daily.  The patient has Ativan 2 mg 3 times a day if needed at home.  The patient will follow up with Dr. Marjory Lies following discharge.  Prescriptions were given for the Keppra and the Vimpat.  The patient is not to operate a  motor vehicle.     Madison [name], M.D.     CKW/MEDQ  D:  02/27/2011  T:  02/28/2011  Job:  [id number]  cc:   [facility]  Electronically Signed by Thana Farr M.D. on 03/04/2011 08:31:59 AM

## 2011-03-04 NOTE — H&P (Signed)
[name], [name]             ACCOUNT NO.:  [id number]  MEDICAL RECORD NO.:  [id number]           PATIENT TYPE:  I  LOCATION:  3016                         FACILITY:  [facility]  PHYSICIAN:  Marlan Palau, M.D.  DATE OF BIRTH:  11-04-1976  DATE OF ADMISSION:  02/26/2011 DATE OF DISCHARGE:                             HISTORY & PHYSICAL   HISTORY OF PRESENT ILLNESS:  Madison [name] is a 35 year old white female born on 02/16/1976 with a history of seizures.  This patient comes [name] with recurring seizures.  The patient has had 4 such seizures today. The patient began having seizures around 4:30 p.m. today and has been on Keppra and Vimpat.  The patient is on 1000 mg twice a day of Keppra and 50 mg twice a day of Vimpat.  The [name] after 4 seizures today.  A CT scan of the brain done at the [name] was unremarkable.  PAST MEDICAL HISTORY:  Significant for: 1. History of seizures with recent recurrence. 2. Breast cancer, status post bilateral mastectomy. 3. Wolff-Parkinson-White syndrome. 4. Migraine headache. 5. Depression and anxiety. 6. Appendectomy. 7. Bilateral tubal ligation. 8. Tonsillectomy. 9. Questionable fibromyalgia. 10.Gallbladder resection. 11.Hysterectomy recently in the last week for fibroids. 12.Peptic ulcer disease. 13.Hypertension. 14.Obesity.  MEDICATIONS:  Include: 1. Tamoxifen 20 mg daily. 2. Zoloft 150 mg daily. 3. Ativan 2 mg 3 times daily if needed. 4. Ranitidine 150 mg daily. 5. Norvasc 10 mg daily. 6. Lunesta 3 mg at night if needed. 7. Vimpat 50 mg twice daily. 8. Keppra 1000 mg twice daily.  The patient smokes half-pack of cigarettes daily.  Drinks alcohol on occasion.  She has an allergy to [name] and TAXOL.  SOCIAL HISTORY:  This patient is divorced and is not working, lives with her  parents in [place], [place] area.  FAMILY MEDICAL HISTORY:  Notable for a history of diabetes, hypertension, and cardiac disease.  There is no family history of seizures.  REVIEW OF SYSTEMS:  Notable for no recent fevers or chills.  The patient does report headache.  Denies neck pain.  Does have some low back pain. Denies any chest pains, abdominal pains, nausea, vomiting, troubles controlling the bowels or bladder.  The patient reports no focal numbness or weakness on the face, arms, or legs or gait disturbance.  PHYSICAL EXAMINATION:  VITAL SIGNS:  Blood pressure is 110/70, heart rate 100, respiratory rate 18, temperature afebrile. GENERAL:  This patient is a moderately obese white female, who is alert and cooperative at the time of examination. HEENT:  Head is atraumatic.  Eyes:  Pupils are equal, round, and reactive to light. NECK:  Supple.  No carotid bruits noted. RESPIRATORY:  Clear. CARDIOVASCULAR:  Regular rate and rhythm.  No obvious murmurs or rubs noted. ABDOMEN:  Obese.  Positive bowel sounds.  No organomegaly or tenderness noted. EXTREMITIES:  Without significant edema. NEUROLOGIC:  Cranial nerves as above.  Facial symmetry is present.  The patient has good sensation of face to  pinprick, soft touch bilaterally, has good strength to facial muscles, head turn, shoulder shrug bilaterally.  Speech is well enunciated, not aphasic.  Motor test shows 5/5 strength in all fours.  Good symmetric motor tone is noted throughout.  The patient has good finger-nose-finger, heel-to-shin bilaterally.  Gait was not tested.  Pinprick, soft touch, vibratory sensation intact in all fours.  Deep tendon reflexes symmetric.  Normal toes downgoing bilaterally.  LABORATORY VALUES:  Done at [name] include white count of 18.9, hemoglobin of 10.7, hematocrit 34.5, MCV of 83.5, platelets of 418. Sodium 144, potassium 3.5, chloride of 108, CO2 of 14, glucose of 128, BUN of 13,  creatinine 0.73, calcium 9.5, total protein 7.6, albumin 3.6, AST of 17, ALT 12, alk phosphatase 70, total bili 0.1.  IMPRESSION: 1. History of seizure disorder with recurrence. 2. Breast cancer, status post bilateral mastectomy. 3. Recent hysterectomy.  This patient will be admitted for observation.  Doses adjustments will be made.  The patient will be increased on the Keppra to 1500 mg twice daily and the Vimpat will be increased to 100 mg twice daily.  The patient does have IV access.  Follow the patient's clinical course while in-house.     Marlan Palau, M.D.    CKW/MEDQ  D:  02/26/2011  T:  02/26/2011  Job:  [id number]  cc:   [facility]  Electronically Signed by Thana Farr M.D. on 03/04/2011 08:31:50 AM

## 2011-03-06 LAB — URINALYSIS, ROUTINE W REFLEX MICROSCOPIC
Bilirubin Urine: NEGATIVE
Glucose, UA: NEGATIVE mg/dL
Specific Gravity, Urine: 1.023 (ref 1.005–1.030)
pH: 5.5 (ref 5.0–8.0)

## 2011-03-06 LAB — CBC
HCT: 32.6 % — ABNORMAL LOW (ref 36.0–46.0)
HCT: 35.4 % — ABNORMAL LOW (ref 36.0–46.0)
Hemoglobin: 11 g/dL — ABNORMAL LOW (ref 12.0–15.0)
MCV: 88.6 fL (ref 78.0–100.0)
Platelets: 288 10*3/uL (ref 150–400)
RBC: 3.66 MIL/uL — ABNORMAL LOW (ref 3.87–5.11)
RBC: 3.99 MIL/uL (ref 3.87–5.11)
RDW: 16.6 % — ABNORMAL HIGH (ref 11.5–15.5)
WBC: 8.1 10*3/uL (ref 4.0–10.5)

## 2011-03-06 LAB — URINE MICROSCOPIC-ADD ON

## 2011-03-06 LAB — DIFFERENTIAL
Eosinophils Absolute: 0.1 10*3/uL (ref 0.0–0.7)
Eosinophils Relative: 1 % (ref 0–5)
Lymphocytes Relative: 8 % — ABNORMAL LOW (ref 12–46)
Lymphs Abs: 0.7 10*3/uL (ref 0.7–4.0)
Monocytes Relative: 3 % (ref 3–12)

## 2011-03-06 LAB — BASIC METABOLIC PANEL
Chloride: 110 mEq/L (ref 96–112)
GFR calc Af Amer: >60 mL/min (ref >60)
GFR calc non Af Amer: >60 mL/min (ref >60)
Potassium: 4 mEq/L (ref 3.5–5.1)
Sodium: 140 mEq/L (ref 135–145)

## 2011-03-09 LAB — CBC
HCT: 36.7 % (ref 36.0–46.0)
MCHC: 32.9 g/dL (ref 30.0–36.0)
MCV: 81.3 fL (ref 78.0–100.0)
Platelets: 295 10*3/uL (ref 150–400)
RBC: 4.52 MIL/uL (ref 3.87–5.11)
WBC: 7 10*3/uL (ref 4.0–10.5)

## 2011-03-09 LAB — COMPREHENSIVE METABOLIC PANEL
BUN: 7 mg/dL (ref 6–23)
CO2: 29 mEq/L (ref 19–32)
Calcium: 10 mg/dL (ref 8.4–10.5)
Chloride: 108 mEq/L (ref 96–112)
Creatinine, Ser: 0.76 mg/dL (ref 0.4–1.2)
GFR calc Af Amer: >60 mL/min (ref >60)
GFR calc non Af Amer: >60 mL/min (ref >60)
Total Bilirubin: 0.5 mg/dL (ref 0.3–1.2)

## 2011-03-09 LAB — POCT CARDIAC MARKERS
CKMB, poc: <1 ng/mL — ABNORMAL LOW (ref 1.0–8.0)
Myoglobin, poc: 37.6 ng/mL (ref 12–200)
Troponin i, poc: <0.05 ng/mL (ref 0.00–0.09)

## 2011-03-09 LAB — DIFFERENTIAL
Basophils Absolute: 0.1 10*3/uL (ref 0.0–0.1)
Eosinophils Absolute: 0 10*3/uL (ref 0.0–0.7)
Lymphocytes Relative: 26 % (ref 12–46)
Monocytes Absolute: 1.3 10*3/uL — ABNORMAL HIGH (ref 0.1–1.0)
Neutrophils Relative %: 55 % (ref 43–77)

## 2011-03-09 LAB — D-DIMER, QUANTITATIVE: D-Dimer, Quant: <0.22 ug/mL-FEU (ref 0.00–0.48)

## 2011-03-09 LAB — PROTIME-INR: Prothrombin Time: 12.5 seconds (ref 11.6–15.2)

## 2011-03-09 LAB — CK TOTAL AND CKMB (NOT AT ARMC): Relative Index: INVALID (ref 0.0–2.5)

## 2011-03-09 LAB — TROPONIN I: Troponin I: 0.02 ng/mL (ref 0.00–0.06)

## 2011-04-10 ENCOUNTER — Telehealth: Payer: Self-pay | Admitting: *Deleted

## 2011-04-10 DIAGNOSIS — F4321 Adjustment disorder with depressed mood: Secondary | ICD-10-CM

## 2011-04-10 DIAGNOSIS — F411 Generalized anxiety disorder: Secondary | ICD-10-CM

## 2011-04-10 DIAGNOSIS — F3340 Major depressive disorder, recurrent, in remission, unspecified: Secondary | ICD-10-CM

## 2011-04-10 NOTE — Telephone Encounter (Signed)
Pharmacist calling regarding confusion over a refill that was sent in.  The refill is for Zoloft 50 mg, 3 tabs daily for a total of 150 mg however the # to dispense is 30.  Did you mean 90?  I did not see Zoloft in her med list.  Please clarify and I will call them back.

## 2011-04-12 MED ORDER — SERTRALINE HCL 50 MG PO TABS: 150.0000 mg | ORAL_TABLET | Freq: Every day | ORAL | Status: DC

## 2011-04-12 NOTE — Telephone Encounter (Signed)
Yes, please do # 90.  A different office started the medication and for some reason I got the refill via fax, so it is not yet in our med list, I am adding it now.

## 2011-04-15 ENCOUNTER — Telehealth: Payer: Self-pay | Admitting: Family Medicine

## 2011-04-15 NOTE — Op Note (Signed)
NAMEDORNA, MALLET             ACCOUNT NO.:  [id number]   MEDICAL RECORD NO.:  [id number]          PATIENT TYPE:  AMB   LOCATION:  DSC                          FACILITY:  [facility]   PHYSICIAN:  Currie Paris, M.D.DATE OF BIRTH:  1976-04-19   DATE OF PROCEDURE:  04/09/2009  DATE OF DISCHARGE:  04/09/2009                               OPERATIVE REPORT   OFFICE MEDICAL RECORD NUMBER:  [id number].   PREOPERATIVE DIAGNOSIS:  Carcinoma, left breast, central, clinical stage  II.   POSTOPERATIVE DIAGNOSIS:  Carcinoma, left breast, central, clinical  stage II.   PROCEDURE:  Left modified mastectomy with Port-A-Cath placement.   SURGEON:  Currie Paris, MD   ASSISTANT:  Angelia Mould. Derrell Lolling, MD   ANESTHESIA:  General.   CLINICAL HISTORY:  This is a 35 year old young lady who presented with a  palpable breast mass that was thought to be a fibroadenoma, but it had  grown recently.  It had been excised a few days earlier and found to be  what appeared to be a mucinous highly aggressive-appearing carcinoma.  After discussion with the patient, she elected to have a mastectomy.  An  MRI done on Friday showed a couple of other probable carcinomas within  the breast and a suspicious node in the axilla.  That was biopsied and  proved to have metastatic carcinoma.  She is therefore scheduled for a  modified mastectomy.  She had seen Dr. Pierce Crane for possible  postoperative chemotherapy and decided to chemo.  A port was also  planned to be placed.   DESCRIPTION OF PROCEDURE:  I saw the patient in the holding area and she  had no further questions.  We reviewed the plans for surgery, risks,  complications, etc., again.   The patient was taken to the operating room.  After satisfactory general  anesthesia had been obtained, the left breast was prepped and draped and  the time-out done.  I outlined an elliptical incision and made it.  I  elevated the skin flap to the sternum,  towards the clavicle and out into  the axilla.  I then made the inferior incision and raised an inferior  flap to below the inframammary fold and out laterally to the latissimus.   The breast was removed from medial to lateral taking the fascia.  When I  got to the edge of the pectoralis muscle, I opened the clavipectoral  fascia.  Vessels were clipped and divided.  I tried to preserve the  blood and nerves supplied to the pectoralis.  I identified the axillary  vein and stripped the axillary contents out from medial to lateral and  superior to inferior.  I divided the second intercostal nerve clipping  it near the ribcage.  I identified the long thoracic thoracodorsal  nerves, preserved those, and stripped out the tissue between those and  then divided the final lateral attachments towards the axilla and  finally towards the latissimus anterior edge.  Once the breast was  handed off, I irrigated and made sure everything was dry.  I put two 19  Blake drains in  and secured them with 2-0 nylons.  We did a final check  for hemostasis and again at this point everything appeared to be dry.  Incision was then closed with staples.   Instruments, gowns, gloves were all changed.  The patient was placed in  Trendelenburg and the right arm was tucked and new prep and drape was  done for Port-A-Cath placement.   I entered the right subclavian vein on the second attempt.  On the first  attempt, I could not really get a space between the clavicle and first  rib and moved a little bit more laterally and entered the vein.  The  guidewire threaded easily and was positioned in the superior vena cava.   I made an incision in the anterior chest wall and fashioned a pocket for  the reservoir.  I dilated the tract with the dilator peel-away sheath  and removed the dilator and guidewire.  The catheter was threaded in  easily to 20 cm and the peel-away sheath removed.  The catheter  aspirated and flushed  easily.   Using fluoro, I backed the catheter up to 16 cm what appeared to be in  the mid SVC.  It aspirated and flushed easily.  I attached it to the  reservoir, which aspirated and flushed easily.  Final check for fluoro  looked okay and the catheter with the reservoir was secured with 2-0  Prolene.   Everything appeared to be dry and the incision was then closed with 3-0  Vicryl, 4-0 Monocryl subcuticular, and Dermabond.   The patient tolerated the procedure well.  Estimated blood loss was  about 100 mL.  There were no operative complications.  All counts were  correct.      Currie Paris, M.D.  Electronically Signed     CJS/MEDQ  D:  04/09/2009  T:  04/10/2009  Job:  [id number]   cc:   Pierce Crane, MD

## 2011-04-15 NOTE — Telephone Encounter (Signed)
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  Was told patient will have to follow up with her provider for further appts or have her card changed from [facility].  Pt was referred previously to [facility].

## 2011-04-15 NOTE — Op Note (Signed)
NAMECHARDAY, CAPETILLO             ACCOUNT NO.:  [id number]   MEDICAL RECORD NO.:  [id number]          PATIENT TYPE:  AMB   LOCATION:  DSC                          FACILITY:  [facility]   PHYSICIAN:  Currie Paris, M.D.DATE OF BIRTH:  Jul 18, 1976   DATE OF PROCEDURE:  04/02/2009  DATE OF DISCHARGE:                               OPERATIVE REPORT   PREOPERATIVE DIAGNOSIS:  Left breast mass with possible second adjacent  mass.   POSTOPERATIVE DIAGNOSIS:  Left breast mass with possible second adjacent  mass.   OPERATION:  Excision of left breast mass.   SURGEON:  Currie Paris, MD   ANESTHESIA:  General.   CLINICAL HISTORY:  This is a 35 year old lady who has a subcutaneous  palpable mass at the areolar margin 3 o'clock position on the left.  It  was thought to be a fibroadenoma but has gradually enlarged and become  painful.  A second 5-mm mass was noted adjacent by ultrasound, but not  palpable.  We decided to excise the palpable mass and see if we could  palpate the second one through the incision.  The second one had  remained stable and appeared to be benign small fibroadenoma.   DESCRIPTION OF PROCEDURE:  The patient was seen in the holding area and  had no further questions.  We marked the left breast as the operative  side.   The patient was taken to the operating room and after satisfactory  general anesthesia had been obtained, the breast was prepped and draped.  The time-out was done.   I injected 0.25% plain Marcaine to help with postop pain relief.  I made  a curvilinear incision at the areolar margin.  The mass was really  attached to the subcutaneous tissue of the areola and this was taken off  taking a very thin flap.  I was then able to start coming around it, but  it was very friable mass and I suspected not a fibroadenoma, but  possibly a papilloma.  As I was working around it, we got into it at one  point because the planned dissection was not clear  because of her dense  breast tissue.  Eventually, however, I was able to work my way  completely around it and excise it.  I then palpated and could feel a  second mass just at the superior edge of the excision of the first mass  and I took this out and this had some creamy material suggestive of some  fibrocystic change.  No other abnormality could be palpated in the  breast.  There was a little bit of what appeared to be more of  papillomatous-type material under the areola, so I took that off as well  and was sent this all to pathology.   I put more Marcaine in.  I made sure everything was dry.  I closed in  layers with 3-0 Vicryl, 4-0 Monocryl subcuticular, and Dermabond.   The patient tolerated the procedure well, and there were no  complications, and all counts were correct.      Currie Paris, M.D.  Electronically Signed     CJS/MEDQ  D:  04/02/2009  T:  04/03/2009  Job:  [id number]

## 2011-04-23 ENCOUNTER — Other Ambulatory Visit (HOSPITAL_COMMUNITY): Payer: Self-pay | Admitting: Interventional Radiology

## 2011-04-23 DIAGNOSIS — C50919 Malignant neoplasm of unspecified site of unspecified female breast: Secondary | ICD-10-CM

## 2011-04-24 ENCOUNTER — Other Ambulatory Visit (HOSPITAL_COMMUNITY): Payer: Self-pay | Admitting: Interventional Radiology

## 2011-04-24 ENCOUNTER — Other Ambulatory Visit (HOSPITAL_COMMUNITY): Payer: Medicaid Other

## 2011-04-24 ENCOUNTER — Ambulatory Visit (HOSPITAL_COMMUNITY)
Admission: RE | Admit: 2011-04-24 | Discharge: 2011-04-24 | Disposition: A | Discharge status: Home / Self Care | Payer: Medicaid Other | Source: Ambulatory Visit | Attending: Interventional Radiology | Admitting: Interventional Radiology

## 2011-04-24 DIAGNOSIS — C50919 Malignant neoplasm of unspecified site of unspecified female breast: Secondary | ICD-10-CM

## 2011-04-24 DIAGNOSIS — I10 Essential (primary) hypertension: Secondary | ICD-10-CM | POA: Insufficient documentation

## 2011-04-24 DIAGNOSIS — Z9010 Acquired absence of unspecified breast and nipple: Secondary | ICD-10-CM | POA: Insufficient documentation

## 2011-04-24 DIAGNOSIS — Z853 Personal history of malignant neoplasm of breast: Secondary | ICD-10-CM | POA: Insufficient documentation

## 2011-04-24 DIAGNOSIS — F17200 Nicotine dependence, unspecified, uncomplicated: Secondary | ICD-10-CM | POA: Insufficient documentation

## 2011-04-24 DIAGNOSIS — G40909 Epilepsy, unspecified, not intractable, without status epilepticus: Secondary | ICD-10-CM | POA: Insufficient documentation

## 2011-04-25 ENCOUNTER — Other Ambulatory Visit (HOSPITAL_COMMUNITY): Payer: Medicaid Other

## 2011-04-29 ENCOUNTER — Other Ambulatory Visit: Payer: Self-pay | Admitting: Oncology

## 2011-04-29 ENCOUNTER — Encounter (HOSPITAL_BASED_OUTPATIENT_CLINIC_OR_DEPARTMENT_OTHER): Payer: Medicaid Other | Admitting: Oncology

## 2011-04-29 DIAGNOSIS — C50419 Malignant neoplasm of upper-outer quadrant of unspecified female breast: Secondary | ICD-10-CM

## 2011-04-29 DIAGNOSIS — Z170 Estrogen receptor positive status [ER+]: Secondary | ICD-10-CM

## 2011-04-29 LAB — CBC WITH DIFFERENTIAL/PLATELET
Basophils Absolute: 0 10*3/uL (ref 0.0–0.1)
Eosinophils Absolute: 0.2 10*3/uL (ref 0.0–0.5)
HGB: 12.3 g/dL (ref 11.6–15.9)
MCV: 76.9 fL — ABNORMAL LOW (ref 79.5–101.0)
MONO#: 0.6 10*3/uL (ref 0.1–0.9)
MONO%: 6.5 % (ref 0.0–14.0)
NEUT#: 6.6 10*3/uL — ABNORMAL HIGH (ref 1.5–6.5)
Platelets: 258 10*3/uL (ref 145–400)
RDW: 16.1 % — ABNORMAL HIGH (ref 11.2–14.5)
WBC: 9.9 10*3/uL (ref 3.9–10.3)

## 2011-04-29 LAB — COMPREHENSIVE METABOLIC PANEL
AST: 15 U/L (ref 0–37)
Alkaline Phosphatase: 72 U/L (ref 39–117)
BUN: 10 mg/dL (ref 6–23)
Creatinine, Ser: 0.73 mg/dL (ref 0.40–1.20)
Total Bilirubin: 0.3 mg/dL (ref 0.3–1.2)

## 2011-05-05 LAB — ESTRADIOL, ULTRA SENS: Estradiol, Ultra Sensitive: 9 pg/mL

## 2011-05-26 ENCOUNTER — Telehealth: Payer: Self-pay | Admitting: Psychology

## 2011-05-26 NOTE — Telephone Encounter (Signed)
Patient called to schedule an appt.  Was recommended by Dr. Lula Olszewski awhile back and patient did not yet follow through.  Scheduled beh-med for 06/12/11 at 2:00 and [facility]DC for 8/1 at 10:30.  She has had significant medical problems and difficulty with depression and anxiety.  Currently on Zoloft and Ativan.  Does not think the Zoloft is all that effective.  Will assess in Beh Med clinic first and if need be, [facility].

## 2011-06-06 ENCOUNTER — Ambulatory Visit (INDEPENDENT_AMBULATORY_CARE_PROVIDER_SITE_OTHER): Payer: Medicaid Other | Admitting: Family Medicine

## 2011-06-06 DIAGNOSIS — G4700 Insomnia, unspecified: Secondary | ICD-10-CM

## 2011-06-06 DIAGNOSIS — F411 Generalized anxiety disorder: Secondary | ICD-10-CM

## 2011-06-06 DIAGNOSIS — R569 Unspecified convulsions: Secondary | ICD-10-CM

## 2011-06-06 DIAGNOSIS — G8929 Other chronic pain: Secondary | ICD-10-CM | POA: Insufficient documentation

## 2011-06-06 DIAGNOSIS — F4321 Adjustment disorder with depressed mood: Secondary | ICD-10-CM

## 2011-06-06 MED ORDER — LORAZEPAM 2 MG PO TABS: 2.0000 mg | ORAL_TABLET | Freq: Two times a day (BID) | ORAL | Status: DC | PRN

## 2011-06-06 MED ORDER — CYCLOBENZAPRINE HCL 10 MG PO TABS: 10.0000 mg | ORAL_TABLET | Freq: Two times a day (BID) | ORAL | Status: AC | PRN

## 2011-06-06 MED ORDER — LORAZEPAM 2 MG PO TABS: 2.0000 mg | ORAL_TABLET | Freq: Three times a day (TID) | ORAL | Status: DC | PRN

## 2011-06-06 MED ORDER — DULOXETINE HCL 60 MG PO CPEP: 60.0000 mg | ORAL_CAPSULE | Freq: Every day | ORAL | Status: DC

## 2011-06-06 NOTE — Patient Instructions (Signed)
It was good to see you, I am sorry you have been feeling so bad lately.  I want to stop your Zoloft and start a different antidepressant called Cymbalta, this medicine also helps with pain.  I have also sent a prescription for flexeril, a muscle relaxer, it may be especially helpful at night time.  Also, remember that with this kind of pain, the more you exercise, the more your pain will ease off.  Please keep your appointment with Dr. Pascal Lux, and call the office with further questions or problems.

## 2011-06-07 NOTE — Progress Notes (Signed)
  Subjective:   Madison [name] is an 35 y.o. female who presents for evaluation and treatment of depressive symptoms.  Onset approximately 2 months ago, gradually worsening since that time.  Current symptoms include depressed mood, anhedonia, insomnia, fatigue, feelings of worthlessness/guilt, difficulty concentrating, hopelessness, impaired memory, recurrent thoughts of death and anxiety.  Current treatment for depression:Medication Sleep problems: Moderate   Energy: Poor Motivation: Poor Concentration: Poor Rumination/worrying: Marked Memory: Poor Tearfulness: Mild  Anxiety: Marked  Panic: Mild  Overall Mood: Moderately worse  Hopelessness: Moderate Suicidal ideation: Absent, however patient does endorse thoughts of being "better off dead"   Other/Psychosocial Stressors: Patient has had problems with a seizure disorder since she went through Chemotherapy for breast cancer.  She was recently hospitalized at [facility] for her seizures because they had increased in frequency.  She is now taking 2000 mg of Keppra BID, which makes her feel tired and weak all the time.   She also complains of chronic pain, all over her body, that has been worsening.  She says she was started on Lyrica by her neurologist, which has helped some but her pain is debilitating some days.    Previous treatment modalities employed include Medications: Prozac, Paxil, currently taking Zoloft and Ativan.   Organic causes of depression present: No substance abuse, but patient is on high dose benzos, takes multiple medications that can cause drowsiness, fatigue,.    Review of Systems Pertinent items are noted in HPI.   Objective:   Mental Status Examination: Posture and motor behavior: Positive for anxious movements Dress, grooming, personal hygiene: Appropriate Facial expression: Appropriate Speech: Appropriate Mood: Positive for depressed mood Coherency and relevance of thought: Appropriate Thought content:  Appropriate Perceptions: Appropriate Orientation:Appropriate Attention and concentration: Appropriate Memory: : Appropriate Abstract reasoning: Appropriate Judgment: Appropriate    PHQ 9: 22  Assessment:   Experiencing the following symptoms of depression most of the day nearly every day for more than two consecutive weeks: depressed mood, loss of interests/pleasure, change in sleep, loss of energy, trouble concentrating, thoughts of worthlessness or guilt  Depressive Disorder:Severe Depression  Suicide Risk Assessment:Patient denies suicidality, can say she wants to live for her family, but does say she sometimes wonders if it would have been better if she died from her breast cancer.   Plan:    1. Anxiety state, unspecified  DULoxetine (CYMBALTA) 60 MG capsule, LORazepam (ATIVAN) 2 MG tablet  2. INSOMNIA    3. SEIZURE DISORDER    4. DEPRESSION, SITUATIONAL  DULoxetine (CYMBALTA) 60 MG capsule  5. Chronic pain  cyclobenzaprine (FLEXERIL) 10 MG tablet    Reviewed concept of depression as biochemical imbalance of neurotransmitters and rationale for treatment. Instructed patient to contact office or on-call physician promptly should condition worsen or any new symptoms appear and provided on-call telephone numbers.    Also discussed Chronic pain in relation to depression, that the two are connected, and that she should continue to try to exercise as this will help both her mood and her pain.  Patient has a Therapy appointment scheduled with Dr. Pascal Lux next week, as well as an appointment in Mood Disorder clinic.  Will follow her therapy, and make medication adjustments as needed.

## 2011-06-10 ENCOUNTER — Telehealth: Payer: Self-pay | Admitting: Family Medicine

## 2011-06-10 NOTE — Telephone Encounter (Signed)
Asher Muir has questions about the Rx of lorazepam.  It is about the quantities of august and September.  The meds are not documented in chart or refill list (as they were wrote on rx paper more than likely)  Will forward to MD to give Asher Muir a call Jerrid Forgette, Maryjo Rochester

## 2011-06-10 NOTE — Telephone Encounter (Signed)
Has questions about the dosage and directions for prescription for this patient.

## 2011-06-11 NOTE — Telephone Encounter (Signed)
Rx was documented in chart, mistake made on frequency for August and September.  Called Pharmacy and told them that Frequency  Of Lorazepam should be BID PRN, and # 60.

## 2011-06-12 ENCOUNTER — Ambulatory Visit (INDEPENDENT_AMBULATORY_CARE_PROVIDER_SITE_OTHER): Payer: Medicaid Other | Admitting: Psychology

## 2011-06-12 DIAGNOSIS — F4321 Adjustment disorder with depressed mood: Secondary | ICD-10-CM

## 2011-06-12 NOTE — Patient Instructions (Addendum)
Please schedule a follow-up for:  July 26th at 3:00. We talked about your situation being like crisis or survival mode.  If you aren't anxious and sad - you aren't paying attention.  Identifying small steps to help you feel successful while the dust continues to settle might prove helpful. With regards to the living situation we talked about brainstorming all possibilities, figuring out what is healthiest for you and then trying to make that happen.  With regards to hurrying up and waiting - doing something with the waiting can be helpful.  We specifically talked about how you feel better when you have somewhere you have to be.  Getting up around the same time and going to bed around the same time is good.  Also - accomplishing one thing per day will help build a feeling of success and meaning. Although you may not feel grateful to be alive - I did hear a lot of gratitude about your health care providers.  That is something.

## 2011-06-13 NOTE — Progress Notes (Signed)
Carey presented for an initial psychological assessment.  A client information sheet detailing the Behavioral Medicine Service was provided.  The patient voiced an understanding of what was detailed on this sheet including the issue of confidentiality and the limits thereof.  Presenting Problem: The short version:  Diagnosed with breast cancer at 32.  Multiple surgeries including what sounds like one mastectomy followed by another + a hysterectomy, chemo, radiation and new onset of grand mal seizures that have been difficult to manage.  She also mentions that the chemotherapy gave her fibromyalgia so she is in pain most of the time.  Her medication regimen makes her feel bad.  She is (in her words) deformed, with no money and no place to live.  When diagnosed with breast CA in 2010, she reports her parents "took over."  She moved her and her daughter out of their apartment and in (I think) with her father.  Recently, her father said she will have to move out secondary to a landlord issue.  Her choices for where to live are limited and none of them sound good to her.  She reports she is in a "hurry up and wait" mode currently waiting for disability which her lawyer says she will get.  She has been denied two times thus far.  She can't drive and she is not supposed to be alone because of her seizure disorder.  Family members tell her she should be "grateful to be alive."  She thinks she "should be" as well but she finds gratitude really tough to come by given all she has been through.  Relevant Medical History: Reviewed problem list.  Per her report, she has 8 doctors.  The med list we have for her is not consistent with what she has.  I will attempt to update.  Patient brings list of meds to all visits so it can be updated at future appt with Dr. Lula Olszewski as well.  Relevant Psychiatric / Psychological History: Patient denies hospitalization for psychiatric reasons.  She denies previous therapy with the  exception of therapy in conjunction with her son.  He was diagnosed as intellectually disabled (IQ estimated in low 71s I think) and Bipolar Disorder.  Denied other therapy.  Treated with Cymbalta and Ativan currently.  Has been tried on Prozac, Paxil and Zoloft in the past.  Not sure date of first treatment with SSRI (if these have all been tried since diagnosis in 2010).  Family History: Patient has been married one time.  They have been separated for four years and attempted reconciliation about a month ago.  He decided it wasn't working and left but she does not have more details than this.  Tomorrow has one daughter with him Irving Burton, 35) and a son from a previous relationship [name], 26).  [name] is a ward of the state and lives in a group home.  She "signed him over" when she was unable to take care of herself secondary to medical issues.  Letica's parents are divorced.  Her mother is remarried.  She has an older sister and a younger brother.  Family lives in [place] except for the brother who lives in [place].  She has one very close friend and she is friends with the couple who provided therapeutic foster care for [place] at one point.  She believes her family to be well-meaning.  History of Abuse: Did not assess.  Education / Occupation: Graduated HS.  Some community college.  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  Substance Use: Did not assess.  Record indicates none presently.

## 2011-06-13 NOTE — Assessment & Plan Note (Signed)
Madison [name] is neatly groomed and appropriately dressed.  She maintains good eye contact and is cooperative and attentive.  Her speech is normal in tone, rate and rhythm.  Mood is depressed with a consistent affect.  Noted some irritability as well.  Thought process is logical and goal directed.  Denied suicidal or homicidal ideation.  Does not appear to be responding to any internal stimuli.  Able to maintain train of thought and concentrate on the questions.  Judgment and insight are average.  Complex picture given the "trauma" of her health conditions and the impact the subsequent treatments and medication have had on her psyche and brain chemistry.  She reports her first difficulty with mood to be around age 39 - two years after her son was born.  She felt overwhelmed, sad and lonely.  Her son had special needs and the stress was considerable.  She reported a suicide attempt by overdose at this time.  She denies others.  She says Bipolar Disorder (a diagnosis her son has) runs in her son's father's family.  Did not explore family history of mental health issues on her side.  This might shed some light on the diagnostic picture for her.    At present, major depression seems the most appropriate diagnosis.  PTSD is a possibility given the life-threatening nature of her medical issues.  The anxiety she experiences seems appropriate to the situation (the threats are real and chronic) and yet is functionally impairing.  Do not want to pathologize her emotional response when validation might be more appropriate.  Suspect there is some suspicion about how helpful therapy will be so focused a lot this meeting on rapport building.  Also recognize she needs any kind of relief she can get.  Framed therapy as, at the very least, a place where she doesn't have to defend feeling angry about her situation and where crying is not seen as a weakness but rather an appropriate response to a really bad situation.  Eventually  would like to move to forming a new identity out of this but her basic needs for shelter and safety (from a medical standpoint) are still up in the air and need to be addressed first.

## 2011-06-17 DIAGNOSIS — R413 Other amnesia: Secondary | ICD-10-CM

## 2011-06-17 DIAGNOSIS — G40209 Localization-related (focal) (partial) symptomatic epilepsy and epileptic syndromes with complex partial seizures, not intractable, without status epilepticus: Secondary | ICD-10-CM

## 2011-06-17 DIAGNOSIS — F331 Major depressive disorder, recurrent, moderate: Secondary | ICD-10-CM

## 2011-06-19 ENCOUNTER — Encounter: Payer: Self-pay | Admitting: Internal Medicine

## 2011-06-26 ENCOUNTER — Ambulatory Visit (INDEPENDENT_AMBULATORY_CARE_PROVIDER_SITE_OTHER): Payer: Medicaid Other | Admitting: Psychology

## 2011-06-26 DIAGNOSIS — F4321 Adjustment disorder with depressed mood: Secondary | ICD-10-CM

## 2011-06-26 DIAGNOSIS — F411 Generalized anxiety disorder: Secondary | ICD-10-CM

## 2011-06-26 NOTE — Assessment & Plan Note (Signed)
See treatment for depression.  Might consider visualization or diaphragmatic breathing in addition to cognitive techniques.

## 2011-06-26 NOTE — Assessment & Plan Note (Signed)
Poorly managed at present.  Discussed [facility] referral and patient remains interested.  Do not want to medicate a normal response to a terrible situation and yet, if pharmacology can bring some relief, it seems reasonable to attempt to get it.  Provided some things to consider and a bibliotherapy reference.  Attempted to look at some cognitions and was met with resistance.

## 2011-06-26 NOTE — Progress Notes (Signed)
Madison [name] presents in a similar place as last visit.  I had intended to get the following questions answered:  Update on living situation:  She plans to stay with her dad.  She is worried about tensions developing secondary to close quarters and overall stress.  Her take on continued living in survival mode:  She wants to be numb - at least for a little while.  She is overwhelmed much of the time and wants a break from feeling so much.  Discussed at length.  Accomplishing something daily:  Made an effort at this and was successful some (but not all) of the time.  When looking at this closer, she expressed ambivalence.  She wants to sleep until she wakes up in a different place (another version of doing nothing) and at the same time, she doesn't want to sit around anymore.  She talked about a patch of trees off the back of her dad's house.  There is one dead one amongst the living ones.  It is the same height as all the others but clearly different in that it is dead.  She sees herself as that tree.  Used this as a jumping off point for how she can become among the living.  Of note:  Does not struggle with not having breasts.  She reports she is okay with this and does not desire reconstruction.

## 2011-07-02 ENCOUNTER — Ambulatory Visit (INDEPENDENT_AMBULATORY_CARE_PROVIDER_SITE_OTHER): Payer: Medicaid Other | Admitting: Psychology

## 2011-07-02 ENCOUNTER — Encounter: Payer: Self-pay | Admitting: Psychology

## 2011-07-02 DIAGNOSIS — F4321 Adjustment disorder with depressed mood: Secondary | ICD-10-CM

## 2011-07-02 NOTE — Progress Notes (Signed)
Terilyn presents for her first appointment with [facility].  Dr. Kathrynn Running and I reviewed her chart together including the assessment I did during her initial behavioral medicine appointment on 06/12/2011.  Additional information gathered today included:  Family history for mood issues.  She has a full brother with significant alcohol (and potentially other drug) issues.  Her maternal grandmother committed suicide when Sydelle's mother was 37 years old.  Her paternal grandmother was considered "looney" but no further information about this was known.  Yasenia denied any mental health or substance use issues in her mother or father.  Her father is retired from the [employer].  Her mother is COO of [employer].  After her first episode of depression around age 16, Recia reports she had at least several years of no mood disturbance and not being treated with anything.  She reports that Prozac and Paxil worked for a time but stopped around the six month mark.  She was on Wellbutrin and thinks that this medication may have worked the best for her.  She can not take it now secondary to her epilepsy.    Big issue today for Katrenia is that her daughter called from [place] to state that she wants to stay there and live with her father.  Santia thinks it is fear about her mother's health that is the primary reason.    Also of note, Gabriell reported having a seizure this past Sunday.  She talked to her neurologist on Monday.

## 2011-07-02 NOTE — Assessment & Plan Note (Addendum)
One goal of today's visit was to try to determine what kind of mood disorder predated the cancer diagnosis, chemotherapy and seizure disorder.  Given family and personal history, a bipolar spectrum disorder is a possibility.  A historical report of symptoms did not yield symptoms consistent with mania or hypomania making recurrent major depression more likely.    The impact her breast cancer treatment has had on her biology makes it less clear as to how to treat.  Dr. Kathrynn Running believes that getting the seizures well managed is the first thing that needs to happen and therefore, does not want to initiate antidepressant treatment at present.  Journey is wanting some relief now.  Dr. Kathrynn Running thinks that adding an anti-seizure drug that has proven mood benefits might be helpful for both managing seizures and mood however he does not want to proceed without discussing it with Layza's neurologist.  Addendum:  Dr. Kathrynn Running contacted Dr. Saralyn Pilar office.  He is not due back until 07/11/11.  Authorization was faxed.  Dr. Kathrynn Running is awaiting a call back.  Braedyn signed a release of information and Dr. Kathrynn Running plans to call to discuss today.  We will be in contact with Arda about recommendations.  The issue of her daughter's recent decision has a very big impact on Mishka's mood and outlook.  Will follow that during her therapy appointment next week.

## 2011-07-02 NOTE — Patient Instructions (Signed)
I plan to see you back for a therapy appointment on:  August 7th at 10:00.  We will talk more about your daughter's decision then. Dr. Kathrynn Running will call your neurologist to discuss your specific situation.  At present, getting your seizures under control is the biggest priority. Since the Cymbalta is helpful for your pain and likely helping some with your mood, Dr. Kathrynn Running recommended you keep that for now. I am sorry to hear about your broken heart.

## 2011-07-04 ENCOUNTER — Encounter (HOSPITAL_BASED_OUTPATIENT_CLINIC_OR_DEPARTMENT_OTHER): Payer: Medicaid Other | Admitting: Oncology

## 2011-07-04 ENCOUNTER — Other Ambulatory Visit: Payer: Self-pay | Admitting: Oncology

## 2011-07-04 DIAGNOSIS — C50419 Malignant neoplasm of upper-outer quadrant of unspecified female breast: Secondary | ICD-10-CM

## 2011-07-04 DIAGNOSIS — Z452 Encounter for adjustment and management of vascular access device: Secondary | ICD-10-CM

## 2011-07-04 LAB — CBC WITH DIFFERENTIAL/PLATELET
BASO%: 0.8 % (ref 0.0–2.0)
EOS%: 4.1 % (ref 0.0–7.0)
MCH: 25.8 pg (ref 25.1–34.0)
MCHC: 32.5 g/dL (ref 31.5–36.0)
MCV: 79.3 fL — ABNORMAL LOW (ref 79.5–101.0)
MONO%: 10.2 % (ref 0.0–14.0)
NEUT#: 5.5 10*3/uL (ref 1.5–6.5)
RBC: 5.27 10*6/uL (ref 3.70–5.45)
RDW: 16.7 % — ABNORMAL HIGH (ref 11.2–14.5)

## 2011-07-04 LAB — COMPREHENSIVE METABOLIC PANEL
ALT: 21 U/L (ref 0–35)
AST: 24 U/L (ref 0–37)
Alkaline Phosphatase: 87 U/L (ref 39–117)
Potassium: 4 mEq/L (ref 3.5–5.3)
Sodium: 138 mEq/L (ref 135–145)
Total Bilirubin: 0.3 mg/dL (ref 0.3–1.2)
Total Protein: 7.6 g/dL (ref 6.0–8.3)

## 2011-07-08 ENCOUNTER — Ambulatory Visit (INDEPENDENT_AMBULATORY_CARE_PROVIDER_SITE_OTHER): Payer: Medicaid Other | Admitting: Psychology

## 2011-07-08 DIAGNOSIS — F4321 Adjustment disorder with depressed mood: Secondary | ICD-10-CM

## 2011-07-08 NOTE — Patient Instructions (Signed)
Please schedule a follow-up for August 14th at 2:00. We are looking at little things you can do to help create tiny shifts in how you are feeling (keep this perspective when you are thinking about these things).  One thing you noted was having to ask for money for every thing that you need (prescriptions, copays, etc...).  Talking to your parents about doing this differently may help you with getting a sense of control over one aspect of your life.

## 2011-07-08 NOTE — Assessment & Plan Note (Addendum)
Report of mood is depressed.  Affect is consistent.  Denies active suicidal ideation but would welcome death at this point.  She says she is not afraid and feels like Irving Burton has people to care for her.  I pointed out that at times her language does reflect a sense of hope for the future and she talked about how she tries to focus some on positive things (e.g. Thinking about the independence disability might afford her).    Her thinking seems accurate in some regards.  One challenge I had was to the thought that Irving Burton no longer needs her (this statement related to Emily's choice to live with her father).  Discussed the difference between feeling a certain way and reality.  Chailyn does think that Irving Burton will learn some valuable lessons living with her father and may eventually choose to come back to [place].    Focused on the issue of control.  She feels like she has none (talks about her health condition, losing [name] and now [place]).  Assessed whether she is interested in looking for ways to control what she can.  She says she is not interested in much of anything and cited the depression as a major barrier.    Provided a lot of empathy and hopefully support.  I want this to be a place where she can talk about the tough stuff and does not need to put on a happy face.  At the same time, I think if she pushes herself to make a few changes, she may feel a little bit better.  It is a tough line to walk.  She agreed to one small change and at the end of the visit reported that if she did not have to ask her parents EVERY TIME for $3 or $4, it would feel a bit better.  Discussed barriers to doing this.  She said she would attempt a conversation.  See patient instructions.

## 2011-07-08 NOTE — Progress Notes (Signed)
Focus of our visit was Madison [name] daughter's Madison [name]) decision to live with her father in [place].  Madison [name] already feels useless and worthless and now she laments that the one place where she felt a little purposeful is gone.  Additionally, she thinks EVERY DAY will be the same now without her daughter around.  Her daughter is currently with her but is very irritable and stuck in her Ipad Madison [name] thinks this is Madison [name]'s way of dealing with the soon-to-be separation).  Madison [name] expressed frustration over no one wanting to "touch her" from a medical perspective.  She wants relief from her depression but has to wait until specialists talk and reach some kind of conclusion.  She is fearful that nothing will be done for fear of doing her some harm.  Her perspective is that harm is worth the risk because feeling the way she currently is is simply not worth it.    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  She emphatically states that if cancer is detected or reoccurs, she will not have chemotherapy.  She thinks she is done with this fight.

## 2011-07-15 ENCOUNTER — Ambulatory Visit (INDEPENDENT_AMBULATORY_CARE_PROVIDER_SITE_OTHER): Payer: Medicaid Other | Admitting: Psychology

## 2011-07-15 DIAGNOSIS — F4321 Adjustment disorder with depressed mood: Secondary | ICD-10-CM

## 2011-07-15 DIAGNOSIS — F411 Generalized anxiety disorder: Secondary | ICD-10-CM

## 2011-07-15 NOTE — Assessment & Plan Note (Addendum)
Report of mood sad, anxious and irritable.  No one feels predominate to her.  Her affect is consistent with this.  Thoughts are goal directed.    Given patient report today and history (remote) of suicide attempt, I think inpatient evaluation is appropriate and necessary.  Patient is agreeable.  Will gather information and attempt to facilitate admission today.  The hope is that [facility] will be able to attend to her complex medical needs while in search of medication to better help her mood.  Madison [name] will follow with me via telephone as she is able and we will follow-up based on [name]'s recommendations.  Of note:  Patient feels safe to go home and is in favor of this hospitalization.  She is not deemed to be at imminent risk of self-harm, however, this needs to be attended to today.  I provided [name] a copy of this note today to take with her.

## 2011-07-15 NOTE — Assessment & Plan Note (Signed)
See assessment and plan under depression diagnosis.

## 2011-07-15 NOTE — Progress Notes (Signed)
Madison [name] presented today for follow-up.  We continued to discuss her depressive and anxiety symptoms.  She reports feeling no better and, in terms of anxiety, worse since her daughter left for [facility].  She reports she spent the last three days in bed, with no change of clothes and that the appointment today was the prompt to get out of bed.  She also states that thoughts of self-harm have increased in frequency and duration.  She is frustrated with how long it has taken to get changes in her psychiatric medications while also understanding the reasons physicians want to proceed with caution.  She says today that she is needing help acutely.

## 2011-07-15 NOTE — Patient Instructions (Addendum)
I will call you as soon as I get any information from Dr. Saralyn Pilar office or Dr. Maye Hides (psychologist at [facility]). If things get worse and you feel unsafe, you can present to the [facility] ED or [facility] ED for evaluation and help. Thanks for coming in today.  I hope this helps get you moving in the right direction.

## 2011-07-16 ENCOUNTER — Emergency Department (HOSPITAL_COMMUNITY)
Admission: EM | Admit: 2011-07-16 | Discharge: 2011-07-16 | Disposition: A | Discharge status: Other Acute Inpatient Hospital | Payer: Medicaid Other | Attending: Emergency Medicine | Admitting: Emergency Medicine

## 2011-07-16 ENCOUNTER — Emergency Department (HOSPITAL_COMMUNITY): Payer: Medicaid Other

## 2011-07-16 DIAGNOSIS — I10 Essential (primary) hypertension: Secondary | ICD-10-CM | POA: Insufficient documentation

## 2011-07-16 DIAGNOSIS — F329 Major depressive disorder, single episode, unspecified: Secondary | ICD-10-CM | POA: Insufficient documentation

## 2011-07-16 DIAGNOSIS — R4182 Altered mental status, unspecified: Secondary | ICD-10-CM | POA: Insufficient documentation

## 2011-07-16 DIAGNOSIS — G40909 Epilepsy, unspecified, not intractable, without status epilepticus: Secondary | ICD-10-CM | POA: Insufficient documentation

## 2011-07-16 DIAGNOSIS — Z853 Personal history of malignant neoplasm of breast: Secondary | ICD-10-CM | POA: Insufficient documentation

## 2011-07-16 DIAGNOSIS — F3289 Other specified depressive episodes: Secondary | ICD-10-CM | POA: Insufficient documentation

## 2011-07-16 DIAGNOSIS — R5381 Other malaise: Secondary | ICD-10-CM | POA: Insufficient documentation

## 2011-07-16 LAB — DIFFERENTIAL
Eosinophils Absolute: 0.2 10*3/uL (ref 0.0–0.7)
Eosinophils Relative: 2 % (ref 0–5)
Lymphocytes Relative: 21 % (ref 12–46)
Lymphs Abs: 2 10*3/uL (ref 0.7–4.0)
Monocytes Absolute: 0.8 10*3/uL (ref 0.1–1.0)
Monocytes Relative: 8 % (ref 3–12)

## 2011-07-16 LAB — RAPID URINE DRUG SCREEN, HOSP PERFORMED
Barbiturates: NOT DETECTED
Benzodiazepines: NOT DETECTED
Cocaine: NOT DETECTED
Opiates: NOT DETECTED

## 2011-07-16 LAB — URINALYSIS, ROUTINE W REFLEX MICROSCOPIC
Bilirubin Urine: NEGATIVE
Nitrite: NEGATIVE
Urobilinogen, UA: 0.2 mg/dL (ref 0.0–1.0)
pH: 6 (ref 5.0–8.0)

## 2011-07-16 LAB — COMPREHENSIVE METABOLIC PANEL
Albumin: 3.6 g/dL (ref 3.5–5.2)
Alkaline Phosphatase: 88 U/L (ref 39–117)
BUN: 11 mg/dL (ref 6–23)
Calcium: 10 mg/dL (ref 8.4–10.5)
Creatinine, Ser: 0.65 mg/dL (ref 0.50–1.10)
GFR calc Af Amer: >60 mL/min (ref >60)
Glucose, Bld: 102 mg/dL — ABNORMAL HIGH (ref 70–99)
Potassium: 3.5 mEq/L (ref 3.5–5.1)
Total Protein: 7.4 g/dL (ref 6.0–8.3)

## 2011-07-16 LAB — URINE MICROSCOPIC-ADD ON

## 2011-07-16 LAB — CBC
HCT: 38.6 % (ref 36.0–46.0)
MCH: 26.6 pg (ref 26.0–34.0)
MCHC: 33.2 g/dL (ref 30.0–36.0)
MCV: 80.1 fL (ref 78.0–100.0)
Platelets: 298 10*3/uL (ref 150–400)
RDW: 16 % — ABNORMAL HIGH (ref 11.5–15.5)
WBC: 9.9 10*3/uL (ref 4.0–10.5)

## 2011-07-16 LAB — ETHANOL: Alcohol, Ethyl (B): <11 mg/dL (ref 0–11)

## 2011-07-16 LAB — SALICYLATE LEVEL: Salicylate Lvl: <2 mg/dL — ABNORMAL LOW (ref 2.8–20.0)

## 2011-07-18 LAB — URINE CULTURE: Culture  Setup Time: 201208151419

## 2011-07-23 ENCOUNTER — Telehealth: Payer: Self-pay | Admitting: Psychology

## 2011-07-23 NOTE — Telephone Encounter (Signed)
Madison [name] called to schedule an appointment.  I saw her last Tuesday and we discussed inpatient hospitalization to get her medications adjusted to help her moods.  I tried to facilitate that with her neurologist and the psychiatry service at [facility].  The psychiatric beds were filled and I never heard back from Dr. Inez Catalina (neurologist).  Madison [name] had a plan to follow up with him in his office to discuss further on Wednesday or to present to the [facility] ED if things deteriorated considerably.  She reported that on Wednesday morning, her dad had trouble awakening her and when he was able to she was hallucinating.  She ended up at [facility], admitted to the Neuro ICU for a severe kidney infection.  Per her report, she had IV Bactrim to treat the kidney infection.  She also had a psychiatric consult.  They changed her Ativan to Klonopin (now taking 0.5 mg twice a day) and said it was okay to increase Cymbalta to 90 mg.  [facility] didn't want to make this change at the same time as the change to Klonopin and told Madison [name] to follow up with her PCP.    She was discharged on Friday and had three seizures within a four hour period.  She did not return to the hospital but they called in [facility] and she has not had a seizure since.    We scheduled for Thursday, August 30th at 2:00.  She has follow-up with Dr. Lula Olszewski at 3:00.  It seems to me that her psychiatric (med management) should be done in the same place as her seizure care by people that are especially attuned to both things.  Will discuss with Dr. Kathrynn Running and Dr. Lula Olszewski.

## 2011-07-31 ENCOUNTER — Encounter: Payer: Self-pay | Admitting: Family Medicine

## 2011-07-31 ENCOUNTER — Ambulatory Visit (INDEPENDENT_AMBULATORY_CARE_PROVIDER_SITE_OTHER): Payer: Medicaid Other | Admitting: Psychology

## 2011-07-31 ENCOUNTER — Ambulatory Visit (INDEPENDENT_AMBULATORY_CARE_PROVIDER_SITE_OTHER): Payer: Medicaid Other | Admitting: Family Medicine

## 2011-07-31 VITALS — BP 161/100 | HR 101 | Temp 98.0°F | Wt 218.0 lb

## 2011-07-31 DIAGNOSIS — R569 Unspecified convulsions: Secondary | ICD-10-CM

## 2011-07-31 DIAGNOSIS — G8929 Other chronic pain: Secondary | ICD-10-CM

## 2011-07-31 DIAGNOSIS — F411 Generalized anxiety disorder: Secondary | ICD-10-CM

## 2011-07-31 DIAGNOSIS — F329 Major depressive disorder, single episode, unspecified: Secondary | ICD-10-CM

## 2011-07-31 DIAGNOSIS — Z87440 Personal history of urinary (tract) infections: Secondary | ICD-10-CM

## 2011-07-31 LAB — POCT URINALYSIS DIPSTICK
Bilirubin, UA: NEGATIVE
Leukocytes, UA: NEGATIVE
Nitrite, UA: NEGATIVE
Protein, UA: NEGATIVE
pH, UA: 5.5

## 2011-07-31 NOTE — Progress Notes (Signed)
Madison [name] retold some of her story that started with meeting in my office, discussing inpatient hospitalization, developing a severe kidney infection, ending up in the neuro ICU at [facility], basically losing three days of time and having seizures on the way home from being discharged.  She questions whether people can walk away from such an experience in a better place.  She is struck by how she needed something (inpatient hospitalization) and got it - not at all in the way that she wanted or anticipated.  This coupled with wanting and getting that "break" from all the thoughts that swirl in her head, has left her in a lighter, more relaxed place.  This is perplexing to her in a good way.  She talked about her Ephri[employer] Knuckles beliefs and more generically, belief in something greater than herself, that inform her experience.  She feels like she has some restored faith but is concerned that it might go away again.  She want to know how to maintain it.

## 2011-07-31 NOTE — Patient Instructions (Addendum)
It was good to see you.  I am happy to take over your blood pressure medications.  I am going to make a referral for you to see Psychiatry at [facility] so they can coordinate your "psychotropic medications."   I will make a referral to physical therapy.  The office will contact you with both those appointments.   Please make an appointment to see me again in one month.

## 2011-07-31 NOTE — Assessment & Plan Note (Signed)
Report of mood is improved although near the end of session, she did report staying in bed one day this past week.  Affect was significantly brighter than last presentation (noticed even in body language when I got her out of the waiting room).  It remains a mixed bag - wanting to (and actually) feeling freer while still feeling very constrained by lack of independence - financial and otherwise.  No evidence of SI today.  She states she is in a wholly different place.  Risk remains.  She wants to know specific cognitive and behavioral strategies to keep her in this place.  I don't think it is helpful to "give" her the answers. She doesn't like this.  Did discuss using her experience to develop cognitions that she can call on daily.  Also - provided a bibliotherapy resource that will hopefully prove helpful.  Follow-up on September 11th at 9:00 a.m. Or as needed.

## 2011-08-01 NOTE — Assessment & Plan Note (Signed)
Improved but still significantly impacting pt's life.  She would like to see PT, will refer.

## 2011-08-01 NOTE — Assessment & Plan Note (Signed)
Pt reports better "acceptance" recently.  Changed from lorazepam to klonopin.

## 2011-08-01 NOTE — Progress Notes (Signed)
Subjective:    Patient ID: Madison [name], female    DOB: 1976/04/14, 35 y.o.   MRN: [id number]  HPI  Wana presents for follow up after hospitalization at [facility].  She presented with altered mental status was transferred there to the Neurology service for concern for seizure activity.  She had a delirium, and psychiatry saw her in consultation as the Neuro team was concerned her depression had worsened and caused the mental status changes.  However, she was found to have a urinary tract infection, which she was treated for, and her mental status improved to baseline.  While in the hospital, lorazepam was discontinued and patient started on Klonopin.  When she was discharged, she had several seizures upon going home, and the Neurologist started Zonisamide.    Patient reports she is feeling somewhat better, but is beginning to accept she is never going to feel normal again.  She complains that Dr. Donnie Coffin (oncologist) has her on Arimidex, which makes her chest wall hurt all the time.  She says she feels tired and bad all the time.  She has pain everywhere, which she says the lyrica has helped, but she still hurts.  She does not want to be on narcotics, she is scared both of becoming dependent on them and of feeling drowsy or dizzy and falling from their side effects.  She say she just wants to feel better.  She would like help specifically with her left shoulder pain, with her back, hip, and knee pain.   She is concerned her kidney infection is not all the way better.  She says she never had urinary frequency or dysuria, only back pain.  She still has some back pain so she is worried it is not gone.  Denies fevers or chills.    Review of Systems Pertinent items noted in HPI.     Objective:   Physical Exam BP 161/100  Pulse 101  Temp(Src) 98 F (36.7 C) (Oral)  Wt 218 lb (98.884 kg) General appearance: alert, cooperative and no distress Eyes: conjunctivae/corneas clear. PERRL,  EOM's intact. Fundi benign. Throat: lips, mucosa, and tongue normal; teeth and gums normal Neck: no adenopathy, supple, symmetrical, trachea midline and thyroid not enlarged, symmetric, no tenderness/mass/nodules Back: symmetric, no curvature. ROM normal. No CVA tenderness., + muscle spasm in right lower back.  Lungs: clear to auscultation bilaterally Heart: regular rate and rhythm, S1, S2 normal, no murmur, click, rub or gallop Abdomen: soft, non-tender; bowel sounds normal; no masses,  no organomegaly Extremities: extremities normal, atraumatic, no cyanosis or edema       Assessment & Plan:  ANXIETY STATE, UNSPECIFIED Pt reports better "acceptance" recently.  Changed from lorazepam to klonopin.    Depressive disorder, not elsewhere classified Physicians at [facility] had recommended increasing Cymbalta from 60 to 90 mg, but did not make the change because they were already changing benzo.  Considering pt's complicated nature, seizure disorder, and polypharmacy, I do not feel comfortable making this change.  Feel specialist, either Neurology or Psychiatry would be more appropriate.  Patient would like to see Psychiatry at [facility] for medication management and continue therapy with Dr. Pascal Lux.  Will make referral.   SEIZURE DISORDER Concern for side effects of medications, but poor control.  Per [facility].  Concern for medication interactions with Pscyh meds, will refer for specialty management.   Chronic pain Improved but still significantly impacting pt's life.  She would like to see PT, will refer.

## 2011-08-01 NOTE — Assessment & Plan Note (Signed)
Concern for side effects of medications, but poor control.  Per [facility].  Concern for medication interactions with Pscyh meds, will refer for specialty management.

## 2011-08-01 NOTE — Assessment & Plan Note (Signed)
Physicians at [facility] had recommended increasing Cymbalta from 60 to 90 mg, but did not make the change because they were already changing benzo.  Considering pt's complicated nature, seizure disorder, and polypharmacy, I do not feel comfortable making this change.  Feel specialist, either Neurology or Psychiatry would be more appropriate.  Patient would like to see Psychiatry at [facility] for medication management and continue therapy with Dr. Pascal Lux.  Will make referral.

## 2011-08-05 ENCOUNTER — Encounter: Payer: Self-pay | Admitting: *Deleted

## 2011-08-06 ENCOUNTER — Ambulatory Visit: Payer: Medicaid Other | Admitting: Family Medicine

## 2011-08-12 ENCOUNTER — Ambulatory Visit (INDEPENDENT_AMBULATORY_CARE_PROVIDER_SITE_OTHER): Payer: Medicaid Other | Admitting: Psychology

## 2011-08-12 DIAGNOSIS — F329 Major depressive disorder, single episode, unspecified: Secondary | ICD-10-CM

## 2011-08-12 NOTE — Assessment & Plan Note (Signed)
Busy, talkative meeting.  Report of mood remains more elevated than times past.  Did not assess "days in bed" since the last time I saw her.  Affect remains brighter.  Language is future oriented and more hopeful.  She is especially excited about starting physical therapy - something that came about from her last appointment with Dr. Lula Olszewski.  Lula's referral to [facility] was "denied."  She plans on following up with Dr. Inez Catalina as she agrees that her psychopharmacology should be attended to by people that have easy access to her neurologist.  Did not follow-up on bibliotherapy resource.  Her follow-up appt is scheduled the day of her PT assessment.  Will see how that went, check in on mood issues, disability progress and bibliotherapy.  September 20th at 11:00.

## 2011-08-12 NOTE — Progress Notes (Signed)
Loxley reports that she continues to have improved mood over initial presentation.  Her mother (per [facility]) says like she seems to handle things better.  She had an appointment scheduled with O'Donovan that she was anticipating.  She had a lot of questions to ask including his thoughts on her functional abilities.  She reported that every time she has a seizure, according to O"Donovan's office the "clock" resets.  In order to be considered controlled, she needs to be seizure free for 18 months.  She is worried that if her disability doesn't come through, the "bottom" will fall out and so she wants to develop a Plan B (e.g. Can she work?  If so, when?  What kind of work?).  We also discussed that if the disability does come through, it will help her financial independence but she still has barriers to feeling productive and having meaning in her life.  Spent some time talking about family relationships.  She said that she and her mom discussed the money issue again and although Jen disliked the conversation, her mother was able to see how troubling it is for [facility] to have to ask for money for basic necessities like copays, prescriptions, shampoo.  Since then, her mother has given her mother every pay check, unprompted and this has made a difference for Daisy.  Discussed differences in Yamili's need to process and the way she copes vs. her mother's.  Jani shared that when her mother and her mother's twin sister were adolescents, they were the first to discover their mom had committed suicide.  Winslow thinks this plays a huge role in how her mother approaches Junnie when it comes to emotional issues.    Of note, the dead tree outside Nefertari's back door was cut down yesterday.  She had seen herself in this dead tree (amongst the living trees).  She thinks it may be a sign she "needs to put her big girl panties on."

## 2011-08-21 ENCOUNTER — Ambulatory Visit: Payer: Medicaid Other | Attending: Family Medicine | Admitting: Physical Therapy

## 2011-08-21 ENCOUNTER — Ambulatory Visit (INDEPENDENT_AMBULATORY_CARE_PROVIDER_SITE_OTHER): Payer: Medicaid Other | Admitting: Psychology

## 2011-08-21 DIAGNOSIS — F329 Major depressive disorder, single episode, unspecified: Secondary | ICD-10-CM

## 2011-08-21 DIAGNOSIS — M2560 Stiffness of unspecified joint, not elsewhere classified: Secondary | ICD-10-CM | POA: Insufficient documentation

## 2011-08-21 DIAGNOSIS — IMO0001 Reserved for inherently not codable concepts without codable children: Secondary | ICD-10-CM | POA: Insufficient documentation

## 2011-08-21 DIAGNOSIS — M2550 Pain in unspecified joint: Secondary | ICD-10-CM | POA: Insufficient documentation

## 2011-08-21 DIAGNOSIS — R5381 Other malaise: Secondary | ICD-10-CM | POA: Insufficient documentation

## 2011-08-21 NOTE — Progress Notes (Signed)
Debby was running late for her appointment secondary to her PT appointment running over.  She was sore and did not expect so much "work" to be done this initial appointment but overall, was pleased with her therapist's assessment.  Discussed one recommendation made (that she use a walker for a time) because of the emotional toll it took on Vincent.  Tyliah reported a rough week complete with three days in bed.  She thinks a situation that highlighted her lack of independence and the weather (that increased her pain perceptions) played a role.  She believes she shifted out of it yesterday, took a shower and did some laundry and overall is feeling better.  She is scheduled to meet with a psychiatrist for her disability on 9/26.  She thinks this is a sign that things might be moving along.  She has not been able to reschedule with O'Donovan and this is frustrating.  The nurse requested that [facility] email her questions to her and the nurse will try to get the physician to answer them.  Her priority is getting help with her mood medications.  A close second is getting a handle on when she will no longer have to have a "babysitter."  She wants to have a little breathing room where someone isn't watching over her 24/7.

## 2011-08-21 NOTE — Assessment & Plan Note (Signed)
Report of mood today is depressed but less so than previous this week.  Affect is within normal limits.  Thoughts are clear and goal directed.  No evidence of SI / HI.  Remains functionally impaired at least a significant part of each week (secondary to mood and pain issues).  Continues to speak of the future and uses language that demonstrates energy and hope for future endeavors.  This is likely hard to maintain day to day when there is nothing to fill her life.  Discussed briefly the issue of structure and how it might be helpful.  Occasionally she will fall asleep and recently slept for four hours in the afternoon.  She was frustrated with her dad for not waking her up.  Discussed being intentional about naps in order to maintain some sense of structure / routine for her sleep.  Also emphasized her being responsible for this.  Did not push structure on days where pain keeps her in bed.    Will follow 09/02/11 or as needed.

## 2011-08-28 ENCOUNTER — Ambulatory Visit (INDEPENDENT_AMBULATORY_CARE_PROVIDER_SITE_OTHER): Payer: Medicaid Other | Admitting: Family Medicine

## 2011-08-28 ENCOUNTER — Telehealth: Payer: Self-pay | Admitting: *Deleted

## 2011-08-28 ENCOUNTER — Encounter: Payer: Self-pay | Admitting: Family Medicine

## 2011-08-28 DIAGNOSIS — J328 Other chronic sinusitis: Secondary | ICD-10-CM

## 2011-08-28 DIAGNOSIS — F329 Major depressive disorder, single episode, unspecified: Secondary | ICD-10-CM

## 2011-08-28 DIAGNOSIS — R569 Unspecified convulsions: Secondary | ICD-10-CM

## 2011-08-28 DIAGNOSIS — G8929 Other chronic pain: Secondary | ICD-10-CM

## 2011-08-28 MED ORDER — MOMETASONE FUROATE 50 MCG/ACT NA SUSP: 2.0000 | Freq: Every day | NASAL | Status: DC

## 2011-08-28 MED ORDER — FLUTICASONE PROPIONATE 50 MCG/ACT NA SUSP: 2.0000 | Freq: Every day | NASAL | Status: DC

## 2011-08-28 MED ORDER — AMOXICILLIN-POT CLAVULANATE 500-125 MG PO TABS: 1.0000 | ORAL_TABLET | Freq: Two times a day (BID) | ORAL | Status: AC

## 2011-08-28 NOTE — Telephone Encounter (Signed)
PA required for nasonex. Form placed in MD box.

## 2011-08-28 NOTE — Patient Instructions (Signed)
It was good to see you.  I hope your sinuses feel better soon, please call the office if things are getting worse.    I am glad you are doing Physical Therapy- I know the increase in physical activity is hard but I think it will pay off (less daily pain) in the long run.

## 2011-08-29 NOTE — Telephone Encounter (Signed)
Rx sent to pharmacy for Flonase

## 2011-09-02 ENCOUNTER — Ambulatory Visit: Payer: Medicaid Other | Attending: Family Medicine | Admitting: Physical Therapy

## 2011-09-02 ENCOUNTER — Ambulatory Visit (INDEPENDENT_AMBULATORY_CARE_PROVIDER_SITE_OTHER): Payer: Medicaid Other | Admitting: Psychology

## 2011-09-02 DIAGNOSIS — M2560 Stiffness of unspecified joint, not elsewhere classified: Secondary | ICD-10-CM | POA: Insufficient documentation

## 2011-09-02 DIAGNOSIS — F3289 Other specified depressive episodes: Secondary | ICD-10-CM

## 2011-09-02 DIAGNOSIS — F17200 Nicotine dependence, unspecified, uncomplicated: Secondary | ICD-10-CM

## 2011-09-02 DIAGNOSIS — F329 Major depressive disorder, single episode, unspecified: Secondary | ICD-10-CM

## 2011-09-02 DIAGNOSIS — M2550 Pain in unspecified joint: Secondary | ICD-10-CM | POA: Insufficient documentation

## 2011-09-02 DIAGNOSIS — F411 Generalized anxiety disorder: Secondary | ICD-10-CM

## 2011-09-02 DIAGNOSIS — R5381 Other malaise: Secondary | ICD-10-CM | POA: Insufficient documentation

## 2011-09-02 DIAGNOSIS — IMO0001 Reserved for inherently not codable concepts without codable children: Secondary | ICD-10-CM | POA: Insufficient documentation

## 2011-09-02 MED ORDER — CYCLOBENZAPRINE HCL 10 MG PO TABS: 10.0000 mg | ORAL_TABLET | Freq: Two times a day (BID) | ORAL | Status: DC | PRN

## 2011-09-02 NOTE — Assessment & Plan Note (Signed)
Poorly controlled, Neuro managing.  Affecting her independence, likely worsening her depression.

## 2011-09-02 NOTE — Progress Notes (Signed)
Madison [name] reported that she had a good experience at [facility] in the [place].  She detailed a story afterward where she was direct with her mother (with whom she was staying temporarily) about her mom's relationship with her husband.  Madison [name] left that night secondary to her own discomfort and things have been tense since.  She met with the psychiatrist and as far as Madison [name] can tell, that went well.  She was told that if she is denied this time, she will need a hearing and that this can take 12-18 months to secure.  This would be very hard for Madison [name] to hear.    Went to PT today.  Is very sore.  Sinus infection is better.

## 2011-09-02 NOTE — Assessment & Plan Note (Signed)
Anxiety is likely up secondary to increased financial stress and challenging relationship dynamics with mom.  Will monitor.

## 2011-09-02 NOTE — Assessment & Plan Note (Signed)
Patient frustrated with her situation but is hopeful.  She will continue to follow with Dr. Pascal Lux and Kathrynn Running, and will try to pursue getting into [facility] clinic.  Continue cymbalta at current dose.

## 2011-09-02 NOTE — Assessment & Plan Note (Signed)
Pt his hopeful that PT will help in the long run with pain.  She is nervous about therapy making her sore acutely, would like flexeril to help.  She agrees to only take aleve as directed on the bottle.

## 2011-09-02 NOTE — Assessment & Plan Note (Signed)
At times, has gone a day without smoking secondary to finances. Will smoke dad's cigarettes.  Will assess more fully next visit.

## 2011-09-02 NOTE — Assessment & Plan Note (Addendum)
Acute on chronic sinusitis, pt with tenderness on exam.  Will Rx Augmentin for Sinus infection.  Will also try nasal steroid for her to help open up nasal passages.

## 2011-09-02 NOTE — Assessment & Plan Note (Signed)
No report of mood.  She looks physically in pain.  She was energized throughout the visit.  Tearful on one occasion.  Would say she is feeling sad and scared about the relationship with her mother - both appropriate feelings given the situation.  Also, her direct comments to her mother seem reasoned, honest and not out of bounds (per her report).  Financial situation is worse than before.  Overdrew bank account to pay for prescriptions and dog food.  Hasn't asked mom for money secondary to the challenging relationship dynamics.  Discussed situation with mom at length and got clear on goals.  Determined that it was okay if Madison [name] wanted to initiate a conversation.  Discussed importance of "I" messages as well as communication of feelings.  She voiced an understanding.

## 2011-09-02 NOTE — Progress Notes (Signed)
  Subjective:    Patient ID: Madison [name], female    DOB: 1976-03-20, 35 y.o.   MRN: [id number]  HPI  Madison [name] presents for follow up.  She has had nasal congestion, headaches sore throat for about a week and a half.  She has been using her netti pot without relief, and she feels like it is getting worse, not better.    She has been to physical therapy and had her initial evaluation, and her therapist feels she has a lot of asymmetry and that she is very deconditioned. She says she felt sore after the first evaluation, and she is preparing herself for the physical activity it will take to get strong again.  She knows it is a lot of work and she thinks she may hurt worse before she starts to feel better.  However, she is very hopeful and is thinking long term about improving her physical activity and strength.    Madison [name] is disappointed that [facility] is not taking outside referrals.   She feels like they are the appropriate group to be deciding about her antidepressant regimen since she is such a special case and they would have access to her neurologist's records.  She is hoping that Dr. Trellis Paganini [facility]) may be able to get her into their clinic.    Right now Madison [name] has been told by Dr. Trellis Paganini that she cannot be alone because of how often she is having seizures.  She says her mom is "babysitting."  She is grateful that she has her mom to watch over her, but also says it is very trying having her around all the time right now.    Review of Systems Negative except HPI    Objective:   Physical Exam BP 137/93  Pulse 95  Temp(Src) 97.9 F (36.6 C) (Oral)  Wt 215 lb 11.2 oz (97.841 kg) General appearance: alert, cooperative and no distress Head: Normocephalic, without obvious abnormality, atraumatic, sinuses tender to percussion Eyes: PERRL, EOMIT Ears: normal TM's and external ear canals both ears Nose: Nares normal. Septum midline. Mucosa normal. No drainage or sinus  tenderness., moderate congestion, sinus tenderness bilateral Throat: lips, mucosa, and tongue normal; teeth and gums normal Lungs: clear to auscultation bilaterally Heart: regular rate and rhythm, S1, S2 normal, no murmur, click, rub or gallop Abdomen: soft, non-tender; bowel sounds normal; no masses,  no organomegaly Pulses: 2+ and symmetric       Assessment & Plan:  OTHER CHRONIC SINUSITIS Acute on chronic sinusitis, pt with tenderness on exam.  Will Rx Augmentin for Sinus infection.  Will also try nasal steroid for her to help open up nasal passages.   SEIZURE DISORDER Poorly controlled, Neuro managing.  Affecting her independence, likely worsening her depression.    Chronic pain Pt his hopeful that PT will help in the long run with pain.  She is nervous about therapy making her sore acutely, would like flexeril to help.  She agrees to only take aleve as directed on the bottle.   Depressive disorder, not elsewhere classified Patient frustrated with her situation but is hopeful.  She will continue to follow with Dr. Pascal Lux and Kathrynn Running, and will try to pursue getting into [facility] clinic.  Continue cymbalta at current dose.

## 2011-09-04 ENCOUNTER — Encounter: Payer: Medicaid Other | Admitting: Physical Therapy

## 2011-09-09 ENCOUNTER — Ambulatory Visit (INDEPENDENT_AMBULATORY_CARE_PROVIDER_SITE_OTHER): Payer: Medicaid Other | Admitting: Psychology

## 2011-09-09 ENCOUNTER — Ambulatory Visit: Payer: Medicaid Other | Admitting: Rehabilitation

## 2011-09-09 ENCOUNTER — Encounter: Payer: Medicaid Other | Admitting: Physical Therapy

## 2011-09-09 DIAGNOSIS — F17200 Nicotine dependence, unspecified, uncomplicated: Secondary | ICD-10-CM

## 2011-09-09 DIAGNOSIS — F329 Major depressive disorder, single episode, unspecified: Secondary | ICD-10-CM

## 2011-09-09 NOTE — Assessment & Plan Note (Signed)
Was on my list but we did not get to it.  Will try to follow next visit.

## 2011-09-09 NOTE — Progress Notes (Signed)
Madison [name] presents with a walker.  She failed a balance test at outpatient rehab earlier today and they said she could not leave without a walker.  She hates it but sees it as a means to an end so will use it when out and about.  She does like that it makes her stand differently and it feels better on her back.  No news on disability.  Focused on relationship with her mother.

## 2011-09-09 NOTE — Assessment & Plan Note (Signed)
Did not report on mood.  Affect was sufficiently bright.  She was not tearful today - even around her walker.  She is much brighter than upon initial presentation.  Worked hard at boundaries - especially regarding responsibility for feelings.  She has a pretty good sense of this but the dynamic with her mother likely pushed her off center a bit.  Validated the boundaries that she set.  Her mother does not allow much exploration or expression of emotion.  This is a safe place for Madison [name] to get in touch with her feelings and get support for the wide range that she experiences, without judgement.    Will follow next week as scheduled.

## 2011-09-11 ENCOUNTER — Encounter: Payer: Medicaid Other | Admitting: Physical Therapy

## 2011-09-16 ENCOUNTER — Encounter: Payer: Medicaid Other | Admitting: Physical Therapy

## 2011-09-16 ENCOUNTER — Ambulatory Visit: Payer: Medicaid Other | Admitting: Psychology

## 2011-09-18 ENCOUNTER — Encounter: Payer: Medicaid Other | Admitting: Physical Therapy

## 2011-09-19 ENCOUNTER — Encounter (HOSPITAL_BASED_OUTPATIENT_CLINIC_OR_DEPARTMENT_OTHER): Payer: Medicaid Other | Admitting: Oncology

## 2011-09-19 DIAGNOSIS — C50419 Malignant neoplasm of upper-outer quadrant of unspecified female breast: Secondary | ICD-10-CM

## 2011-09-19 DIAGNOSIS — Z452 Encounter for adjustment and management of vascular access device: Secondary | ICD-10-CM

## 2011-09-23 ENCOUNTER — Encounter: Payer: Self-pay | Admitting: Physical Therapy

## 2011-09-24 ENCOUNTER — Ambulatory Visit: Payer: Medicaid Other | Admitting: Rehabilitation

## 2011-10-15 ENCOUNTER — Encounter: Payer: Self-pay | Admitting: Family Medicine

## 2011-10-15 ENCOUNTER — Ambulatory Visit (INDEPENDENT_AMBULATORY_CARE_PROVIDER_SITE_OTHER): Payer: Medicare Other | Admitting: Family Medicine

## 2011-10-15 VITALS — BP 152/97 | HR 83 | Temp 98.1°F | Ht 65.0 in | Wt 209.0 lb

## 2011-10-15 DIAGNOSIS — R300 Dysuria: Secondary | ICD-10-CM

## 2011-10-15 DIAGNOSIS — N390 Urinary tract infection, site not specified: Secondary | ICD-10-CM | POA: Insufficient documentation

## 2011-10-15 LAB — POCT URINALYSIS DIPSTICK
Bilirubin, UA: NEGATIVE
Nitrite, UA: NEGATIVE
Protein, UA: NEGATIVE
pH, UA: 7

## 2011-10-15 MED ORDER — SULFAMETHOXAZOLE-TMP DS 800-160 MG PO TABS: 1.0000 | ORAL_TABLET | Freq: Two times a day (BID) | ORAL | Status: AC

## 2011-10-15 NOTE — Assessment & Plan Note (Signed)
Pt with recent history of pyelo, complaining of fevers and has back pain.  UA may be contaminated, will send urine for culture.  Rx double strength bacterium, for 14 days in case of Pyelo.

## 2011-10-15 NOTE — Progress Notes (Signed)
  Subjective:    Madison [name] is a 35 y.o. female who complains of burning with urination, dysuria, foul smelling urine, frequency and pain in her back. . She has had symptoms for 2 days. Patient also complains of back pain and fever. Patient denies cough, rhinitis and vaginal discharge. Patient does have a history of recurrent UTI. Patient does have a history of pyelonephritis. She was admitted to [facility] last summer for Altered Mental status and found to have pyelonephritis.   The following portions of the patient's history were reviewed and updated as appropriate: allergies, current medications, past family history, past medical history, past social history, past surgical history and problem list.  Review of Systems Pertinent items are noted in HPI.    Objective:    BP 152/97  Pulse 83  Temp(Src) 98.1 F (36.7 C) (Oral)  Ht 5\' 5"  (1.651 m)  Wt 209 lb (94.802 kg)  BMI 34.78 kg/m2 General appearance: alert, cooperative and uncomfortable Back: + CVA tenderness of left flank Lungs: clear to auscultation bilaterally Heart: regular rate and rhythm, S1, S2 normal, no murmur, click, rub or gallop Abdomen: +BS, soft, suprapubic tenderness.  Extremities: extremities normal, atraumatic, no cyanosis or edema Pulses: 2+ and symmetric  Laboratory:  Urine dipstick: 1+ for leukocyte esterase.   Micro exam: 1-5 WBCs per HPF, 0 RBCs per HPF and 2+ bacteria.    Assessment:    Acute cystitis and concern for Pyelonephritis.     Plan:    Medications: TMP/SMX. Maintain adequate hydration. Follow up if symptoms not improving, and as needed.

## 2011-10-15 NOTE — Patient Instructions (Signed)
I am sorry you are not feeling good.  Because your back hurts, I am worried the infection is in your kidney, so I want you to take antibiotics for two weeks.  If you do not start feeling better in a few days, please call the office.

## 2011-10-18 LAB — URINE CULTURE

## 2011-10-21 ENCOUNTER — Telehealth: Payer: Self-pay | Admitting: Psychology

## 2011-10-21 NOTE — Telephone Encounter (Signed)
Madison [name] called at 1:10 to cancel her 2:00 appt secondary to just awakening.  She has a kidney infection and is not feeling well.  Would like to r/s after Thanksgiving.

## 2011-10-22 ENCOUNTER — Other Ambulatory Visit: Payer: Self-pay | Admitting: Oncology

## 2011-10-22 ENCOUNTER — Telehealth: Payer: Self-pay | Admitting: Oncology

## 2011-10-22 ENCOUNTER — Ambulatory Visit (HOSPITAL_BASED_OUTPATIENT_CLINIC_OR_DEPARTMENT_OTHER): Payer: Medicaid Other | Admitting: Oncology

## 2011-10-22 ENCOUNTER — Other Ambulatory Visit (HOSPITAL_BASED_OUTPATIENT_CLINIC_OR_DEPARTMENT_OTHER): Payer: Medicaid Other | Admitting: Lab

## 2011-10-22 DIAGNOSIS — C50919 Malignant neoplasm of unspecified site of unspecified female breast: Secondary | ICD-10-CM

## 2011-10-22 DIAGNOSIS — Z170 Estrogen receptor positive status [ER+]: Secondary | ICD-10-CM

## 2011-10-22 DIAGNOSIS — F411 Generalized anxiety disorder: Secondary | ICD-10-CM

## 2011-10-22 DIAGNOSIS — IMO0001 Reserved for inherently not codable concepts without codable children: Secondary | ICD-10-CM

## 2011-10-22 DIAGNOSIS — F329 Major depressive disorder, single episode, unspecified: Secondary | ICD-10-CM

## 2011-10-22 DIAGNOSIS — C50419 Malignant neoplasm of upper-outer quadrant of unspecified female breast: Secondary | ICD-10-CM

## 2011-10-22 LAB — CBC WITH DIFFERENTIAL/PLATELET
Basophils Absolute: 0.1 10*3/uL (ref 0.0–0.1)
EOS%: 4.5 % (ref 0.0–7.0)
Eosinophils Absolute: 0.3 10*3/uL (ref 0.0–0.5)
HGB: 13 g/dL (ref 11.6–15.9)
MONO#: 0.5 10*3/uL (ref 0.1–0.9)
NEUT#: 3.9 10*3/uL (ref 1.5–6.5)
RDW: 15.5 % — ABNORMAL HIGH (ref 11.2–14.5)
lymph#: 2.2 10*3/uL (ref 0.9–3.3)

## 2011-10-22 LAB — COMPREHENSIVE METABOLIC PANEL
ALT: 15 U/L (ref 0–35)
AST: 17 U/L (ref 0–37)
Albumin: 4.1 g/dL (ref 3.5–5.2)
Calcium: 9.2 mg/dL (ref 8.4–10.5)
Chloride: 109 mEq/L (ref 96–112)
Potassium: 3.8 mEq/L (ref 3.5–5.3)

## 2011-10-22 NOTE — Telephone Encounter (Signed)
Gv pt appt for [id number] °

## 2011-10-22 NOTE — Progress Notes (Signed)
Hematology and Oncology Follow Up Visit  Madison [name] [id number] 06-19-1976 35 y.o. 10/22/2011 4:11 PM   Principle Diagnosis: History of node positive, ER/PR positive HER-2 negative breast cancer status post mastectomy with node dissection followed by dose dense a.c. and weekly Taxol on protocol ECoG-03/31/2002, status post right prophylactic mastectomy, laparoscopic oophrectomy currently on letrozole. History of seizures History of chronic pain and possible fibromyalgia History of hypertension  Interim History:  The patient returns for followup. She continues to take large amounts of medication. Her biggest issue is myalgias and what sounds like fibromyalgia. She is on 4 gm of keppra  for her seizure prophylaxis. Her last seizure was on Halloween. He is frustrated by the poor pain control and the need for ongoing medications. She does have Flexeril for muscle spasm but this does make her quite sleepy. She continues to have problems with anxiety and is somewhat depressed. Her blood pressure seems to be under fairly good control.  Medications: I have reviewed the patient's current medications.  Allergies:  Allergies  Allergen Reactions  . Taxol (Paclitaxel)   . Tramadol   . Ultram (Tramadol Hcl)     Past Medical History, Surgical history, Social history, and Family History were reviewed and updated.  Review of Systems: Constitutional:  Negative for fever, chills, night sweats, anorexia, weight loss, pain. Cardiovascular: no chest pain or dyspnea on exertion Respiratory: no cough, shortness of breath, or wheezing Neurological: no TIA or stroke symptoms, no recent auras Dermatological: negative ENT: negative Skin Gastrointestinal: no abdominal pain, change in bowel habits, or black or bloody stools Genito-Urinary: no dysuria, trouble voiding, or hematuria, being treated for uti Hematological and Lymphatic: negative Breast: negative for breast lumps negative Musculoskeletal:  negative Remaining ROS negative.  Physical Exam: Blood pressure 163/89, pulse 82, temperature 98.4 F (36.9 C), height 5\' 5"  (1.651 m), weight 212 lb 3.2 oz (96.253 kg). ECOG:  General appearance: appears stated age, fatigued and mild distress Head: Normocephalic, without obvious abnormality, atraumatic Neck: no adenopathy, no carotid bruit, no JVD, supple, symmetrical, trachea midline and thyroid not enlarged, symmetric, no tenderness/mass/nodules Lymph nodes: Cervical, supraclavicular, and axillary nodes normal. Cardiac : Normal heart Pulmonary: Normal breath sounds Breasts: Status post bilateral mastectomy without evidence of local recurrence Abdomen: Normal Extremities normal Neuro: Normal  Lab Results: Lab Results  Component Value Date   WBC 7.1 10/22/2011   HGB 13.0 10/22/2011   HCT 38.8 10/22/2011   MCV 84.6 10/22/2011   PLT 248 10/22/2011     Chemistry      Component Value Date/Time   NA 138 07/16/2011 1210   K 3.5 07/16/2011 1210   CL 100 07/16/2011 1210   CO2 28 07/16/2011 1210   BUN 11 07/16/2011 1210   CREATININE 0.65 07/16/2011 1210      Component Value Date/Time   CALCIUM 10.0 07/16/2011 1210   ALKPHOS 88 07/16/2011 1210   AST 19 07/16/2011 1210   ALT 21 07/16/2011 1210   BILITOT 0.2* 07/16/2011 1210       Radiological Studies: chest X-ray n/a Mammogram n/a Bone density n/a  Impression and Plan: Pleasant but unfortunate young woman with history of breast cancer and associated other medical issues including hypertension, seizure disorder and fibromyalgia. She has poor pain control and is on a number of medications. She is also quite depressed despite having a therapist and been on antidepressants. We discussed the possibility of doing puncture and we will investigate that for her. In the interim she was in the  same medication I have cautioned her about using a lot of ibuprofen NSAIDs. She is taking Zantac to help minimize the effects on her stomach. I plan to  see her in 3 months time .  More than 50% of the visit was spent in patient-related counselling   Pierce Crane, MD 11/21/20124:11 PM

## 2011-10-27 ENCOUNTER — Telehealth: Payer: Self-pay | Admitting: Family Medicine

## 2011-10-27 MED ORDER — CEPHALEXIN 500 MG PO CAPS: 500.0000 mg | ORAL_CAPSULE | Freq: Three times a day (TID) | ORAL | Status: AC

## 2011-10-27 NOTE — Telephone Encounter (Signed)
Patient asking to speak with MD, says she was put on abx for possible kidney infection, says the abx make her feel really sick and she is still having the same symptoms, wants to know if the culture came back? Thinks she may be on the wrong abx.

## 2011-10-27 NOTE — Telephone Encounter (Signed)
To MD

## 2011-10-27 NOTE — Telephone Encounter (Signed)
Called patient, she is still having back pain, nausea, chills, pain with urination.  Urine culture grew multiple bacterial morphotypes.  Considering pt's history of pyelo, will d/c bactrim and start keflex 500 mg po tid.  Pt is following up next week.

## 2011-10-27 NOTE — Telephone Encounter (Signed)
Addended by: Ardyth Gal on: 10/27/2011 12:20 PM   Modules accepted: Orders

## 2011-10-29 ENCOUNTER — Telehealth: Payer: Self-pay | Admitting: *Deleted

## 2011-10-29 ENCOUNTER — Other Ambulatory Visit: Payer: Self-pay | Admitting: *Deleted

## 2011-10-29 DIAGNOSIS — I10 Essential (primary) hypertension: Secondary | ICD-10-CM

## 2011-10-29 MED ORDER — CAPTOPRIL-HYDROCHLOROTHIAZIDE 25-15 MG PO TABS: 1.0000 | ORAL_TABLET | Freq: Every day | ORAL | Status: DC

## 2011-10-31 NOTE — Telephone Encounter (Signed)
No note

## 2011-11-03 ENCOUNTER — Encounter: Payer: Self-pay | Admitting: Family Medicine

## 2011-11-03 ENCOUNTER — Ambulatory Visit (INDEPENDENT_AMBULATORY_CARE_PROVIDER_SITE_OTHER): Payer: Medicaid Other | Admitting: Family Medicine

## 2011-11-03 VITALS — BP 154/96 | HR 102 | Temp 98.0°F | Ht 65.0 in | Wt 211.0 lb

## 2011-11-03 DIAGNOSIS — I158 Other secondary hypertension: Secondary | ICD-10-CM

## 2011-11-03 DIAGNOSIS — I159 Secondary hypertension, unspecified: Secondary | ICD-10-CM

## 2011-11-03 DIAGNOSIS — I10 Essential (primary) hypertension: Secondary | ICD-10-CM

## 2011-11-03 DIAGNOSIS — G8929 Other chronic pain: Secondary | ICD-10-CM

## 2011-11-03 DIAGNOSIS — N390 Urinary tract infection, site not specified: Secondary | ICD-10-CM

## 2011-11-03 LAB — POCT URINALYSIS DIPSTICK
Blood, UA: NEGATIVE
Protein, UA: NEGATIVE
Spec Grav, UA: 1.015
Urobilinogen, UA: 0.2

## 2011-11-03 MED ORDER — CAPTOPRIL-HYDROCHLOROTHIAZIDE 25-15 MG PO TABS: 1.0000 | ORAL_TABLET | Freq: Every day | ORAL | Status: DC

## 2011-11-03 MED ORDER — HYDROCODONE-ACETAMINOPHEN 5-500 MG PO TABS: 1.0000 | ORAL_TABLET | Freq: Three times a day (TID) | ORAL | Status: AC | PRN

## 2011-11-03 MED ORDER — AMLODIPINE BESYLATE 10 MG PO TABS: 10.0000 mg | ORAL_TABLET | Freq: Every day | ORAL | Status: DC

## 2011-11-03 MED ORDER — DICLOFENAC SODIUM 1 % TD GEL: 1.0000 "application " | Freq: Three times a day (TID) | TRANSDERMAL | Status: DC | PRN

## 2011-11-03 MED ORDER — ESOMEPRAZOLE MAGNESIUM 20 MG PO CPDR: 20.0000 mg | DELAYED_RELEASE_CAPSULE | Freq: Every day | ORAL | Status: DC

## 2011-11-03 NOTE — Assessment & Plan Note (Signed)
I am concerned that patient's symptoms may be from other etiology (low back pain, hx of hysterectomy).  However, will re-collect urine to be sure no infection considering pt had urosepsis last summer.

## 2011-11-03 NOTE — Patient Instructions (Addendum)
It was good to see you.  I am going to check another urinalysis to make sure you do not still have an infection.    I am sorry you are still hurting so much.  I have sent a prescription for Voltaren Gel, a topical NSAID to the pharmacy, you can use this on your joints when they hurt.  For really bad pain days, you can try taking a Vicodin.  They can make you drowsy.

## 2011-11-03 NOTE — Progress Notes (Signed)
  Subjective:    Patient ID: Madison [name], female    DOB: September 12, 1976, 35 y.o.   MRN: [id number]  HPI  Madison [name] comes in for follow up.  She complains that she is still having back pain and pain with urination, although it is a little better than before.  Tomorrow is her last day of keflex.  She denies fevers/chills/nausea.    Chronic pain- this has worsened with the change in the weather.  Madison [name] is still very upset that she could only go to 4 PT sessions, as she felt like moving around more would help her, but she is scared to do it on her own, and they felt she needed a walker, which makes her more nervous about exercising without guidance.  She says she has had some very bad days lately, and knows she is at max doses of lyrica and cymbalta.  Her insurance does not cover lidoderm patches, but she has never tried a topical NSAID.  She does not want to become hooked on narcotics, but has some days that she can barely move.    GERD- Madison [name] is taking ranitidine, and has tried omeprazole, but lately her reflux has been worse.  She is wondering if there are other options for her heart burn.   HTN- patient is taking her blood pressure pills.  She knows today's reading is still high, but says it is always high when she is in pain like today.  She denies chest pain, palpitations, vision changes, dyspnea.    Review of Systems Pertinent Items noted in HPI.     Objective:   Physical Exam BP 154/96  Pulse 102  Temp(Src) 98 F (36.7 C) (Oral)  Ht 5\' 5"  (1.651 m)  Wt 211 lb (95.709 kg)  BMI 35.11 kg/m2 General appearance: alert, cooperative and uncomfortable Eyes: conjunctivae/corneas clear. PERRL, EOM's intact. Fundi benign. Neck: no adenopathy, supple, symmetrical, trachea midline and thyroid not enlarged, symmetric, no tenderness/mass/nodules Lungs: clear to auscultation bilaterally Heart: regular rate and rhythm, S1, S2 normal, no murmur, click, rub or gallop Abdomen: soft, non-tender;  bowel sounds normal; no masses,  no organomegaly Extremities: extremities normal, atraumatic, no cyanosis or edema Neurologic: Grossly normal MSK: Right knee with marked crepitus with motion, minor on left knee. She has some minor joint line tenderness of right knee.  Pt has TTP of back both on spine and in muscular areas.  ROM is limited by pain.  Patient walks with wide based gait.        Assessment & Plan:

## 2011-11-03 NOTE — Assessment & Plan Note (Signed)
Patient has multiple etiologies of pain- Fibromyalgia, likely arthritis, low back pain.  Will rx voltaren gel for joints.  Will do trial of Vicodin as needed for "bad days."  Patient understands the risks of chronic narcotics.   Will Discuss patient with social work to see if any options for further physical therapy or structured exercise class available with her financial limitations.

## 2011-11-03 NOTE — Assessment & Plan Note (Signed)
Will continue current medications despite poor control since patient in high level of pain today.  She is to follow up in 3 months, will re-address further management at that time.

## 2011-11-04 ENCOUNTER — Telehealth: Payer: Self-pay | Admitting: Clinical

## 2011-11-04 NOTE — Telephone Encounter (Signed)
Clinical Child psychotherapist (CSW) received a referral re: pt needing PT and insurance not covering additional sessions. CSW left a message for pt. Theresia Bough, MSW, Theresia Majors [phone]

## 2011-11-10 ENCOUNTER — Telehealth: Payer: Self-pay | Admitting: *Deleted

## 2011-11-10 NOTE — Telephone Encounter (Signed)
Prior Authorization for Nexium 40mg  completed and approved.  Faxed to pharmacy.  Ileana Ladd

## 2011-11-14 ENCOUNTER — Ambulatory Visit: Payer: Self-pay

## 2011-11-14 ENCOUNTER — Ambulatory Visit: Payer: Medicaid Other | Admitting: Psychology

## 2011-11-14 MED ORDER — HEPARIN SOD (PORK) LOCK FLUSH 100 UNIT/ML IV SOLN
500.0000 [IU] | Freq: Once | INTRAVENOUS | Status: AC
  Filled 2011-11-14: qty 5

## 2011-11-14 MED ORDER — SODIUM CHLORIDE 0.9 % IJ SOLN
10.0000 mL | INTRAMUSCULAR | Status: AC | PRN
  Filled 2011-11-14: qty 10

## 2011-11-17 ENCOUNTER — Telehealth: Payer: Self-pay | Admitting: Clinical

## 2011-11-17 NOTE — Telephone Encounter (Signed)
Clinical Child psychotherapist (CSW) contacted pt over the phone to discuss pt need for PT. CSW explored whether pt is able to attend a gym since pt Medicaid only covers 4 PT sessions. Pt stated she is not able to swim because of her seizure disorder and needing to have supervision in the pool. CSW informed pt that if she is able to do light weights/classes at the gym she can apply for a scholarship/membership at the [facility] CSW also encouraged pt to contact her caseworker to find out when the "new year" begins for her Medicaid so she can begin PT again. Pt was appreciative of assistance.   Theresia Bough, MSW, Theres[facility] Majors [phone]

## 2011-12-22 ENCOUNTER — Other Ambulatory Visit: Payer: Self-pay

## 2011-12-22 DIAGNOSIS — C50919 Malignant neoplasm of unspecified site of unspecified female breast: Secondary | ICD-10-CM

## 2011-12-22 MED ORDER — ANASTROZOLE 1 MG PO TABS: 1.0000 mg | ORAL_TABLET | Freq: Every day | ORAL | Status: DC

## 2012-01-15 ENCOUNTER — Ambulatory Visit (HOSPITAL_BASED_OUTPATIENT_CLINIC_OR_DEPARTMENT_OTHER): Payer: Medicaid Other | Admitting: Oncology

## 2012-01-15 ENCOUNTER — Other Ambulatory Visit: Payer: Medicaid Other | Admitting: Lab

## 2012-01-15 ENCOUNTER — Telehealth: Payer: Self-pay | Admitting: *Deleted

## 2012-01-15 VITALS — BP 153/91 | HR 91 | Temp 98.2°F | Wt 212.2 lb

## 2012-01-15 DIAGNOSIS — C50919 Malignant neoplasm of unspecified site of unspecified female breast: Secondary | ICD-10-CM

## 2012-01-15 DIAGNOSIS — C50419 Malignant neoplasm of upper-outer quadrant of unspecified female breast: Secondary | ICD-10-CM

## 2012-01-15 LAB — CBC WITH DIFFERENTIAL/PLATELET
Basophils Absolute: 0.1 10*3/uL (ref 0.0–0.1)
EOS%: 2.4 % (ref 0.0–7.0)
Eosinophils Absolute: 0.2 10*3/uL (ref 0.0–0.5)
HCT: 43.8 % (ref 34.8–46.6)
HGB: 14.8 g/dL (ref 11.6–15.9)
LYMPH%: 22.1 % (ref 14.0–49.7)
MCH: 29 pg (ref 25.1–34.0)
MCV: 85.9 fL (ref 79.5–101.0)
MONO%: 8.3 % (ref 0.0–14.0)
NEUT#: 5.6 10*3/uL (ref 1.5–6.5)
NEUT%: 66.5 % (ref 38.4–76.8)
Platelets: 290 10*3/uL (ref 145–400)

## 2012-01-15 LAB — CANCER ANTIGEN 27.29: CA 27.29: 26 U/mL (ref 0–39)

## 2012-01-15 LAB — COMPREHENSIVE METABOLIC PANEL
Albumin: 4.6 g/dL (ref 3.5–5.2)
Alkaline Phosphatase: 118 U/L — ABNORMAL HIGH (ref 39–117)
BUN: 11 mg/dL (ref 6–23)
CO2: 23 mEq/L (ref 19–32)
Glucose, Bld: 71 mg/dL (ref 70–99)
Total Bilirubin: 0.3 mg/dL (ref 0.3–1.2)
Total Protein: 7.7 g/dL (ref 6.0–8.3)

## 2012-01-15 NOTE — Progress Notes (Signed)
Hematology and Oncology Follow Up Visit  Madison [name] [id number] [birth year] 35 y.o. 01/15/2012 3:09 PM   Principle Diagnosis: History of node positive, ER/PR positive HER-2 negative breast cancer status post mastectomy with node dissection followed by dose dense a.c. and weekly Taxol on protocol ECoG-03/31/2002, status post right prophylactic mastectomy, laparoscopic oophrectomy currently on letrozole. History of seizures History of chronic pain and possible fibromyalgia History of hypertension  Interim History:  The patient returns for followup.She is with her mother.She has not had any seizures since we saw her last. She has side effects from the meds.. Which is being dealt with. Primarily fatigue, sleepiness and decreased energy. She has been having some pain on her chest wall, but aside from that no other issues..  Medications: I have reviewed the patient's current medications.  Allergies:  Allergies  Allergen Reactions  . Taxol (Paclitaxel)   . Tramadol   . Ultram (Tramadol Hcl)     Past Medical History, Surgical history, Social history, and Family History were reviewed and updated.  Review of Systems: Constitutional:  Negative for fever, chills, night sweats, anorexia, weight loss, pain. Cardiovascular: no chest pain or dyspnea on exertion Respiratory: no cough, shortness of breath, or wheezing Neurological: no TIA or stroke symptoms, no recent auras Dermatological: negative ENT: negative Skin Gastrointestinal: no abdominal pain, change in bowel habits, or black or bloody stools Genito-Urinary: no dysuria, trouble voiding, or hematuria, being treated for uti Hematological and Lymphatic: negative Breast: negative for breast lumps negative Musculoskeletal: negative Remaining ROS negative.  Physical Exam: Blood pressure 153/91, pulse 91, temperature 98.2 F (36.8 C), temperature source Oral, weight 212 lb 3.2 oz (96.253 kg). ECOG:  General appearance: appears  stated age, fatigued and mild distress Head: Normocephalic, without obvious abnormality, atraumatic Neck: no adenopathy, no carotid bruit, no JVD, supple, symmetrical, trachea midline and thyroid not enlarged, symmetric, no tenderness/mass/nodules Lymph nodes: Cervical, supraclavicular, and axillary nodes normal. Cardiac : Normal heart Pulmonary: Normal breath sounds Breasts: Status post bilateral mastectomy without evidence of local recurrence, tender lt chest wall, w/out evidence of recurrence. Abdomen: Normal Extremities normal Neuro: Normal  Lab Results: Lab Results  Component Value Date   WBC 8.3 01/15/2012   HGB 14.8 01/15/2012   HCT 43.8 01/15/2012   MCV 85.9 01/15/2012   PLT 290 01/15/2012     Chemistry      Component Value Date/Time   NA 140 10/22/2011 1418   NA 140 10/22/2011 1418   K 3.8 10/22/2011 1418   K 3.8 10/22/2011 1418   CL 109 10/22/2011 1418   CL 109 10/22/2011 1418   CO2 18* 10/22/2011 1418   CO2 18* 10/22/2011 1418   BUN 7 10/22/2011 1418   BUN 7 10/22/2011 1418   CREATININE 0.89 10/22/2011 1418   CREATININE 0.89 10/22/2011 1418      Component Value Date/Time   CALCIUM 9.2 10/22/2011 1418   CALCIUM 9.2 10/22/2011 1418   ALKPHOS 94 10/22/2011 1418   ALKPHOS 94 10/22/2011 1418   AST 17 10/22/2011 1418   AST 17 10/22/2011 1418   ALT 15 10/22/2011 1418   ALT 15 10/22/2011 1418   BILITOT 0.2* 10/22/2011 1418   BILITOT 0.2* 10/22/2011 1418       Radiological Studies: chest X-ray n/a Mammogram n/a Bone density n/a  Impression and Plan: Pleasant but unfortunate young woman with history of breast cancer and associated other medical issues including hypertension, seizure disorder and fibromyalgia. She seems to be more stable and her affect has  improved.  I discussed moving on with her life and getting onto exercise programs etc. Her labs are stable and i will see her in 6 months.  More than 50% of the visit was spent in patient-related  counselling   Pierce Crane, MD 2/14/20133:09 PM

## 2012-01-15 NOTE — Telephone Encounter (Signed)
gave patient appointments for 07-2012 made patient flush appointments every eight weeks printed out calendar and gave to the patient

## 2012-01-27 ENCOUNTER — Ambulatory Visit: Payer: Medicaid Other | Admitting: Family Medicine

## 2012-02-10 ENCOUNTER — Ambulatory Visit (INDEPENDENT_AMBULATORY_CARE_PROVIDER_SITE_OTHER): Payer: Medicaid Other | Admitting: Family Medicine

## 2012-02-10 ENCOUNTER — Encounter: Payer: Self-pay | Admitting: Family Medicine

## 2012-02-10 VITALS — BP 140/90 | HR 68 | Temp 98.2°F | Ht 65.0 in | Wt 213.0 lb

## 2012-02-10 DIAGNOSIS — G8929 Other chronic pain: Secondary | ICD-10-CM

## 2012-02-10 DIAGNOSIS — R296 Repeated falls: Secondary | ICD-10-CM

## 2012-02-10 DIAGNOSIS — Z9181 History of falling: Secondary | ICD-10-CM

## 2012-02-10 DIAGNOSIS — E669 Obesity, unspecified: Secondary | ICD-10-CM

## 2012-02-10 DIAGNOSIS — F329 Major depressive disorder, single episode, unspecified: Secondary | ICD-10-CM

## 2012-02-10 MED ORDER — IBUPROFEN 800 MG PO TABS: 800.0000 mg | ORAL_TABLET | Freq: Three times a day (TID) | ORAL | Status: AC | PRN

## 2012-02-10 MED ORDER — HYDROCODONE-ACETAMINOPHEN 10-500 MG PO TABS: 1.0000 | ORAL_TABLET | Freq: Four times a day (QID) | ORAL | Status: AC | PRN

## 2012-02-10 MED ORDER — AMITRIPTYLINE HCL 75 MG PO TABS: 75.0000 mg | ORAL_TABLET | Freq: Every day | ORAL | Status: DC

## 2012-02-10 NOTE — Patient Instructions (Addendum)
I am sorry you are still hurting so badly.  I am concerned about your falling, and I am going to refer you to physical therapy again for this.  They will contract you.  Please keep walking as much as you can. Please come back and see me in 3 months.

## 2012-02-12 DIAGNOSIS — R296 Repeated falls: Secondary | ICD-10-CM | POA: Insufficient documentation

## 2012-02-12 DIAGNOSIS — E669 Obesity, unspecified: Secondary | ICD-10-CM | POA: Insufficient documentation

## 2012-02-12 NOTE — Assessment & Plan Note (Signed)
Encouraged her to continue walking- I suspect exercise will help her pain and mood, I also think caring for a dog is likely good for her overall well being.  Discussed that I am hoping if her pain is better controlled she will be able to exercise more, and possibly lose some weight- and that I think losing weight may help her back pain.

## 2012-02-12 NOTE — Assessment & Plan Note (Signed)
Worsened recently, patient having thoughts of death, but no SI/HI, and she is nearly never left alone due to seizure disorder.  Will continue cymbalta, and add amitriptyline for depression and pain.  Follow up in 3 months.

## 2012-02-12 NOTE — Progress Notes (Signed)
  Subjective:    Patient ID: Madison [name], female    DOB: 1976-11-22, 36 y.o.   MRN: [id number]  HPI  Madison [name] comes in for follow up.  She has been having a very hard time with her depression and pain lately.   Pain- She has been having severe pain and muscle spasms for the past several weeks.  She has pain throughout her body, but it is worst in her low back.  She also has "sciatica pain" that shoots down her legs.  She is in tears and having difficulty moving today.  She also reports that she is taking both ibuprofen and aleve- nearly twice the maximum dose of NSAIDS than recommended. She says that the vicodin prescribed last visit really helped, especially helped her rest, and she was able to do normal daily activities better.    Depression- she has been very tearful lately.  She has trouble coping with her responsibilities, and says that while she has never thought about hurting herself, she is having thoughts again that it might have been better if she had died from her breast cancer.    Obesity- she has been trying to walk- she has a dog, and has to walk him twice a day, she says usually 10-20 minute walks twice a day. However, her pain severely limits how much walking or other exercise she can do.    Falls- she has to have someone come with her on her walks and to the grocery store because she has had several falls lately.  She says that her friend has had to catch her a few times.  She says sometimes she falls do to a severe muscle spasm and other times it is due to losing her balance.   Review of Systems Pertinent items in HPI.     Objective:   Physical Exam BP 140/90  Pulse 68  Temp(Src) 98.2 F (36.8 C) (Oral)  Ht 5\' 5"  (1.651 m)  Wt 213 lb (96.616 kg)  BMI 35.44 kg/m2 General appearance: alert, cooperative and tearful and uncomfortable. Neck: no adenopathy, no JVD, supple, symmetrical, trachea midline and thyroid not enlarged, symmetric, no tenderness/mass/nodules Lungs:  clear to auscultation bilaterally Heart: regular rate and rhythm, S1, S2 normal, no murmur, click, rub or gallop Extremities: extremities normal, atraumatic, no cyanosis or edema Pulses: 2+ and symmetric Skin: Skin color, texture, turgor normal. No rashes or lesions Neurologic: Grossly normal Psych: Mood is depressed, she is a little anxious.  Thought content normal, judgement in tact, speech normal.        Assessment & Plan:

## 2012-02-12 NOTE — Assessment & Plan Note (Signed)
Patient with worsening pain and depression, which I feel are closely linked.  Will Rx vicodin again, 3 month supply.  Discussed my concerns about narcotic dependence with her, and that it will not be my goal for her to be on narcotics permanently.  I will start amitriptyline, and plan to titrate up.  She may benefit from referral to pain clinic at some point, but is resistant to that right now.  I also expressed my concerns about large amount of NSAIDS- risk of GI bleed or kidney damage.  Patient voices understanding, and agrees to only take ibuprofen 800 tabs if I will prescribe them.  Will have her follow up in 3 months.

## 2012-02-12 NOTE — Assessment & Plan Note (Signed)
Concerning in someone who is essentially post menopausal status, she would be at risk for a serious fracture.  She went to PT last calendar year, and Medicaid would only pay for 3-4 sessions.  Patient feels this was inadequate, but is willing to go again if they will pay for a few more sessions this year.

## 2012-02-18 ENCOUNTER — Telehealth: Payer: Self-pay | Admitting: Family Medicine

## 2012-02-18 NOTE — Telephone Encounter (Signed)
Will forward to PCP 

## 2012-02-18 NOTE — Telephone Encounter (Signed)
Letter written, please print and notify patient.

## 2012-02-18 NOTE — Telephone Encounter (Signed)
[facility] - Fax # is [phone] just needs a release saying that is ok for her to get a message to assist with her medical issues.  She has been before and never had a problem until today.

## 2012-02-18 NOTE — Telephone Encounter (Signed)
Faxed and pt informed. Madison [name], Madison [name]

## 2012-03-02 ENCOUNTER — Ambulatory Visit: Payer: Medicaid Other | Attending: Family Medicine

## 2012-03-08 ENCOUNTER — Encounter: Payer: Self-pay | Admitting: Neurology

## 2012-03-11 ENCOUNTER — Telehealth: Payer: Self-pay | Admitting: *Deleted

## 2012-03-11 NOTE — Telephone Encounter (Signed)
The patient called and stated that she not feeling well. The flush appointment moved to April 29th

## 2012-03-18 ENCOUNTER — Telehealth: Payer: Self-pay | Admitting: Family Medicine

## 2012-03-18 NOTE — Telephone Encounter (Signed)
Patient is calling wanting an Rx for a Sinus Infection.  She said to let MD know that she has been using her Netti Pot and Nasal  Spray and they aren't working.

## 2012-03-19 MED ORDER — AMOXICILLIN-POT CLAVULANATE 875-125 MG PO TABS: 1.0000 | ORAL_TABLET | Freq: Two times a day (BID) | ORAL | Status: DC

## 2012-03-19 NOTE — Telephone Encounter (Signed)
Patient reports history of sinus infections. Last seen 09/2011 for this issue.   Patient has history of intractable epilepsy (per patient) and does not drive.   She is complaining of nasal irritation, subjective fevers, and brownish-colored nasal discharge for the past several days.  Will call in Rx for Augmentin, which worked well for her previously.  Advised her to come into clinic next week if her symptoms are not improved or become worse.  Patient expressed understanding and agreement.

## 2012-03-29 ENCOUNTER — Other Ambulatory Visit: Payer: Self-pay | Admitting: Lab

## 2012-03-29 ENCOUNTER — Encounter: Payer: Self-pay | Admitting: Neurology

## 2012-03-29 ENCOUNTER — Ambulatory Visit (INDEPENDENT_AMBULATORY_CARE_PROVIDER_SITE_OTHER): Payer: Medicaid Other | Admitting: Neurology

## 2012-03-29 ENCOUNTER — Other Ambulatory Visit: Payer: Self-pay | Admitting: Neurology

## 2012-03-29 ENCOUNTER — Ambulatory Visit (HOSPITAL_BASED_OUTPATIENT_CLINIC_OR_DEPARTMENT_OTHER): Payer: Medicaid Other

## 2012-03-29 VITALS — BP 120/72 | HR 100 | Wt 232.0 lb

## 2012-03-29 DIAGNOSIS — Z452 Encounter for adjustment and management of vascular access device: Secondary | ICD-10-CM

## 2012-03-29 DIAGNOSIS — G609 Hereditary and idiopathic neuropathy, unspecified: Secondary | ICD-10-CM

## 2012-03-29 DIAGNOSIS — C50919 Malignant neoplasm of unspecified site of unspecified female breast: Secondary | ICD-10-CM

## 2012-03-29 LAB — VITAMIN B12: Vitamin B-12: 378 pg/mL (ref 211–911)

## 2012-03-29 LAB — COMPREHENSIVE METABOLIC PANEL
Albumin: 4.4 g/dL (ref 3.5–5.2)
CO2: 27 mEq/L (ref 19–32)
Glucose, Bld: 87 mg/dL (ref 70–99)
Potassium: 3.4 mEq/L — ABNORMAL LOW (ref 3.5–5.3)
Sodium: 136 mEq/L (ref 135–145)
Total Protein: 7.2 g/dL (ref 6.0–8.3)

## 2012-03-29 LAB — TSH: TSH: 1.247 u[IU]/mL (ref 0.350–4.500)

## 2012-03-29 MED ORDER — SODIUM CHLORIDE 0.9 % IJ SOLN
10.0000 mL | INTRAMUSCULAR | Status: DC | PRN
  Administered 2012-03-29: 10 mL via INTRAVENOUS
  Filled 2012-03-29: qty 10

## 2012-03-29 MED ORDER — DIAZEPAM 20 MG RE GEL: 20.0000 mg | RECTAL | Status: DC | PRN

## 2012-03-29 MED ORDER — HEPARIN SOD (PORK) LOCK FLUSH 100 UNIT/ML IV SOLN
500.0000 [IU] | Freq: Once | INTRAVENOUS | Status: AC
  Administered 2012-03-29: 500 [IU] via INTRAVENOUS
  Filled 2012-03-29: qty 5

## 2012-03-29 MED ORDER — CLONAZEPAM 1 MG PO TBDP: 2.0000 mg | ORAL_TABLET | Freq: Two times a day (BID) | ORAL | Status: DC | PRN

## 2012-03-29 NOTE — Progress Notes (Signed)
Dear Dr. 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  As you may recall, she is a 36 y.o. year old female with a history breast cancer, s/p bilateral mastectomy and chemotherapy and radiation therapy as well as a history  of generalized convulsions since 2008 when she had one solitary convulsion.  These recurred in October 2010 with the occurrence of 3 seizures in several hours.  At that time she was placed on Keppra and this was increased to 2000mg  bid due to continued rare seizures, with 1 occuring in Dec 2010, 2 in Oct 2011, 2 in Dec 2011.  At that point, because of the perception that she was on too much Keppra her Keppra was reduced and Vimpat was added.  This resulted in severe clusters of seizures with multiple hospitalizations in Feb 2012, March 2012 and March 26, 2011.  With these events she had between 3-5 generalized convulsions in one day.  In May of 2012 she was admitted to the EMU where two GTCS were captured.  These were characterized by 9cps rhythmic spikes that evolves to 8 cps rhythmic activity.  No focal features either clinically or electrographically were noted.  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  The patient was discharged on Keppra 2000mg  bid of Keppra.  She had 4 more clusters of seizures, with her last being in October 2012.  During this time she had zonisamide added to 300mg  qhs.  Her mother who has observed the seizures and with whom she lives says they start with a deep breath in and then a guttural sounds with stiffening and bialteral jerking.  They have never lasted longer than 5 minutes.  She bites her tongue and voids.  She is confused for an hour afterwards.    Triggers are most often infections such as UTIs or sinus infections.  She has never used an oral dissolving benzo or Diastat to try to  prevent her cluster.  She has never been on Lamictal or valproate.  MRI brain has been read as normal.  Routine EEGs have been normal done at [facility].  She complains of constant problems with dull headache.  She takes medication for her headache more than 15 days per month, and this includes ibuprofen or Aleve.  She also has problems with anxiety and depressed mood.  Her PCP Dr. Lula Olszewski is treating her for this as she says psychiatrist "won't treat her" because the psychotropic meds "lower the seizure threshold".  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 This include paclitaxel and adriamycin.  She has chronic unsteadiness of gait as well.  She continues to have a port in place given she is such a difficult blood draw.  Past Medical History  Diagnosis Date  . Breast CA   . Tobacco user   . Grand mal seizure   . Fibromyalgia   . Seizures     Past Surgical History  Procedure Date  . Appendectomy   . Tonsillectomy   . Tubal ligation   . Mastectomy     bilateral  . Cholecystectomy March 2011  . Abdominal hysterectomy March 2012    History   Social History  . Marital Status: Legally Separated    Spouse Name: N/A    Number of Children: N/A  . Years of Education: N/A   Social History Main  Topics  . Smoking status: Current Everyday Smoker -- 1.0 packs/day    Types: Cigarettes  . Smokeless tobacco: Never Used   Comment: 1 ppd for 20 years   . Alcohol Use: Yes     minimal   . Drug Use: None  . Sexually Active: None   Other Topics Concern  . None   Social History Narrative   Living w parents in [place] due to cancer and inability to take car for herself or others. Has 3 children. 89 yo has bipolar and MR, currently in foster home. Other 2 live with her. Divorced.     No family history on file.  Current Outpatient Prescriptions on File Prior to Visit  Medication Sig Dispense Refill  . amitriptyline (ELAVIL) 75 MG tablet Take 1  tablet (75 mg total) by mouth at bedtime.  30 tablet  1  . amLODipine (NORVASC) 10 MG tablet Take 1 tablet (10 mg total) by mouth daily.  30 tablet  11  . anastrozole (ARIMIDEX) 1 MG tablet Take 1 tablet (1 mg total) by mouth daily.  30 tablet  4  . captopril-hydrochlorothiazide (CAPOZIDE) 25-15 MG per tablet Take 1 tablet by mouth daily.  30 tablet  11  . clonazePAM (KLONOPIN) 0.5 MG tablet Take 0.5 mg by mouth 2 (two) times daily.       . cyclobenzaprine (FLEXERIL) 10 MG tablet Take 1 tablet (10 mg total) by mouth 2 (two) times daily as needed for muscle spasms. For muscle spasm  60 tablet  5  . diclofenac sodium (VOLTAREN) 1 % GEL Apply 1 application topically 3 (three) times daily as needed (for joint pain).  100 g  3  . DULoxetine (CYMBALTA) 60 MG capsule Take 1 capsule (60 mg total) by mouth daily.  30 capsule  11  . esomeprazole (NEXIUM) 20 MG capsule Take 1 capsule (20 mg total) by mouth daily before breakfast.  30 capsule  11  . eszopiclone (LUNESTA) 1 MG TABS Take 3 mg by mouth at bedtime as needed. Take immediately before bedtime       . levETIRAcetam (KEPPRA) 1000 MG tablet Take 2,000 mg by mouth 2 (two) times daily. Rx by Dr. Inez Catalina, [facility]      . lidocaine (XYLOCAINE) 2 % solution Take 20 mLs by mouth as needed. Pt states she uses after a seizure.      . pregabalin (LYRICA) 150 MG capsule Take 150 mg by mouth 2 (two) times daily. Rx by Dr. Inez Catalina, [facility]       . pyridOXINE (VITAMIN B-6) 100 MG tablet Take 100 mg by mouth daily. To help with side effects of Keppra, per pt.      . zonisamide (ZONEGRAN) 100 MG capsule Take 100 mg by mouth daily. 01/15/12- Pt states she takes 3 tabs at bedtime.      . Diazepam (DIASTAT ACUDIAL) 20 MG GEL Place 20 mg rectally as needed (for seizures longer than 5 minutes or seizures clusters.).  2 each  3  . fluticasone (FLONASE) 50 MCG/ACT nasal spray Place 2 sprays into the nose daily.  16 g  12  . HYDROcodone-acetaminophen (VICODIN) 5-500 MG per  tablet Take 1 tablet by mouth as needed.      . naproxen sodium (ANAPROX) 220 MG tablet Take 220 mg by mouth as needed.        . ranitidine (ZANTAC) 150 MG tablet Take 150 mg by mouth as needed.       Marland Kitchen DISCONTD:  amLODipine (NORVASC) 10 MG tablet Take 10 mg by mouth every morning.         Current Facility-Administered Medications on File Prior to Visit  Medication Dose Route Frequency Provider Last Rate Last Dose  . heparin lock flush 100 unit/mL  500 Units Intravenous Once Pierce Crane, MD      . sodium chloride 0.9 % injection 10 mL  10 mL Intravenous PRN Pierce Crane, MD        Allergies  Allergen Reactions  . Taxol (Paclitaxel)   . Tramadol   . Ultram (Tramadol Hcl)       ROS:  13 systems were reviewed and are notable for headaches, problems with memory, disorientation, inability to concentrate, weakness in the arms and legs, loss of sensation, difficulty with balance and anxiety and depression.  All other review of systems are unremarkable.   Examination:  Filed Vitals:   03/29/12 1309  BP: 120/72  Pulse: 100  Weight: 232 lb (105.235 kg)     In general, well appearing, although with a flat affect.  Cardiovascular: The patient has a regular rate and rhythm and no carotid bruits.  Fundoscopy:  Disks are flat. Vessel caliber within normal limits.  Mental status:   The patient is oriented to person, place but not date. Recent and remote memory are intact. Attention span and concentration are normal. Language including repetition, naming, following commands are intact. Fund of knowledge of current and historical events, as well as vocabulary are normal.  Cranial Nerves: Pupils are equally round and reactive to light. Visual fields full to confrontation. Extraocular movements are intact without nystagmus. Facial sensation and muscles of mastication are intact. Muscles of facial expression are symmetric. Hearing intact to bilateral finger rub. Tongue protrusion, uvula, palate  midline.  Shoulder shrug intact  Motor:  The patient has normal bulk and tone, no pronator drift.  There are no adventitious movements.  5/5 muscle strength bilaterally.  Reflexes:   Biceps  Triceps Brachioradialis Knee Ankle  Right 2+  2+  2+   2+ 0  Left  2+  2+  2+   2+ 0  Toes down  Coordination:  Normal finger to nose.  No dysdiadokinesia.  Sensation is decreased absent to vibration in the feet and decreased in the hands.  Temperature decreased to mid calf and upper fore arm bilaterally.  Position sense decreased in hands! and feet.  Gait and Station are ataxic, wide based  Romberg is positive.   MRI Brain was reviewed and was unremarkable with no focal findings seen.  Impression/Recs: 1.  Idiopathic generalized epilepsy - Given her presentation I would normally be worried about a possible paraneoplastic cause for her epilepsy.  However, this is less likely given the normal background EEG, non-focal nature of her discharges, and normal MRI brain.  I am not going to change her medications for now, while it sounds like she may be having some agitation from the Keppra.  If she were to have breakthrough seizures I would increase the zonisamide.  I have given them Diastat 20mg  as well as clonazepam 2mg  for prolonged seizures and seizure clustering as well. 2.  Ataxia, presumably sensory - I assume this is from the paclitaxel.  I am going to get an EMG/NCS given its severity as well as some PN labs. 3.  Memory problems - She has significant psychomotor slowing and memory problems.  This could be from her medication, her mood, or less likely her epilepsy.  I would like  to assess with NP testing, but unfortunately historically medicaid would not pay for this.  However, she is applying for disability and Medicare would pay for this. 4.  Headaches - patient is likely suffering from medication overuse.  We will have to investigate this in the future.   We will see the patient back in 3  months.  Thank you for having [place] see Madison [name] in consultation.  Feel free to contact me with any questions.  Lupita Raider Modesto Charon, MD [facility], [place] [address] [place], [place] [address] Phone: [phone] Fax: [phone].

## 2012-03-29 NOTE — Progress Notes (Signed)
03/29/12 1525 Labs drawn from portacath for Dr. Nash Dimmer office

## 2012-03-29 NOTE — Patient Instructions (Signed)
Your appointment for the nerve conduction studies/electromyelogram is scheduled for Monday, May 6th at 1:15pm at [facility] [address] [place], [place] [phone].

## 2012-03-29 NOTE — Patient Instructions (Signed)
Call MD for problems 

## 2012-03-30 LAB — CBC WITH DIFFERENTIAL/PLATELET
HCT: 42.1 % (ref 36.0–46.0)
Hemoglobin: 13.8 g/dL (ref 12.0–15.0)
Lymphocytes Relative: 30 % (ref 12–46)
Monocytes Absolute: 0.7 10*3/uL (ref 0.1–1.0)
Monocytes Relative: 9 % (ref 3–12)
Neutro Abs: 4.7 10*3/uL (ref 1.7–7.7)
WBC: 8.2 10*3/uL (ref 4.0–10.5)

## 2012-03-31 LAB — SPEP & IFE WITH QIG
Albumin ELP: 57.1 % (ref 55.8–66.1)
Alpha-1-Globulin: 5 % — ABNORMAL HIGH (ref 2.9–4.9)
Beta 2: 5.1 % (ref 3.2–6.5)
IgM, Serum: 56 mg/dL (ref 52–322)

## 2012-03-31 LAB — METHYLMALONIC ACID, SERUM: Methylmalonic Acid, Quant: 0.37 umol/L (ref <0.40)

## 2012-04-04 ENCOUNTER — Other Ambulatory Visit: Payer: Self-pay | Admitting: Family Medicine

## 2012-04-05 ENCOUNTER — Telehealth: Payer: Self-pay | Admitting: Neurology

## 2012-04-05 NOTE — Telephone Encounter (Signed)
Pt states that when she tried to fill her Klonopin, the pharmacy informed her that the dissolving tablets only come in .5 mg while Dr. Modesto Charon wrote the rx for 1 mg tabs. The pharmacy expressed some concern that it might be difficult for the patient to take 4 pills at a time. She has not filled to rx yet due to the discrepancy. Pt states that the pharmacy is also supposed to be contacting us regarding this issue.

## 2012-04-05 NOTE — Telephone Encounter (Signed)
Called and spoke with the patient. She states she filled the Diastat prescription but the Klonopin oral dissoving tabs she didn't fill as they only come in .5 mg and the pharmacist thought that putting 4 under the tongue (total of 2 mg) at once would not be a good idea. She wants to know if you want to rewrite that prescription. They are going out of town this coming Saturday and she would like to be sure she has all of her medications. I told her that I would check with Dr. Modesto Charon and see what he recommended. She is fine with this plan. **Dr. Modesto Charon, please advise the Klonopin dissolving tablets re: dose change? Thank you.

## 2012-04-05 NOTE — Telephone Encounter (Signed)
Patient called to say the pharmacy called her to say they had filled her Klonopin dissolving tablets as ordered. She doesn't need [place] to check with Dr. Modesto Charon. She will let us know if there are any more issues with this medication. **FYI only Dr. Modesto Charon. Sorry for the confusion.

## 2012-04-19 ENCOUNTER — Telehealth: Payer: Self-pay | Admitting: Neurology

## 2012-04-19 NOTE — Telephone Encounter (Signed)
Pt is calling for results of lab work done from last appointment.

## 2012-04-20 NOTE — Telephone Encounter (Signed)
Called and spoke with the patient. Informed all lab work normal. She states when she had her NCV/EMG done, Dr. Anne Hahn told her there was something going on in her back and the only way to find out was with a MRI. She wants to know if Dr. Modesto Charon thinks that that is needed and will he order it or should she presue that with her primary care MD. I told her I would check with Dr. Modesto Charon and we would be in touch. Madison [name] was ok with this plan. **Dr. Modesto Charon, please advise MRI? Test results have been received. Thanks.

## 2012-04-20 NOTE — Telephone Encounter (Signed)
everything looked ok.

## 2012-04-21 ENCOUNTER — Other Ambulatory Visit: Payer: Self-pay | Admitting: Neurology

## 2012-04-21 DIAGNOSIS — R270 Ataxia, unspecified: Secondary | ICD-10-CM

## 2012-04-21 DIAGNOSIS — G609 Hereditary and idiopathic neuropathy, unspecified: Secondary | ICD-10-CM

## 2012-04-21 NOTE — Telephone Encounter (Signed)
I left a message for the patient to call me.

## 2012-04-21 NOTE — Telephone Encounter (Signed)
I think that is a good idea.  Jan - could you set her up for a MRI of her L-spine without contrast.

## 2012-04-21 NOTE — Telephone Encounter (Signed)
Called and spoke with the patient. Appointment for the MRI L-spine is scheduled on Friday, May 24th at 7:45 am; arrive at 7:15 am at [address] [facility]; requested the open scanner. No additional questions or concerns.

## 2012-04-23 ENCOUNTER — Ambulatory Visit
Admission: RE | Admit: 2012-04-23 | Discharge: 2012-04-23 | Disposition: A | Discharge status: Home / Self Care | Payer: Medicaid Other | Source: Ambulatory Visit | Attending: Neurology | Admitting: Neurology

## 2012-04-23 DIAGNOSIS — G609 Hereditary and idiopathic neuropathy, unspecified: Secondary | ICD-10-CM

## 2012-04-27 ENCOUNTER — Telehealth: Payer: Self-pay | Admitting: Neurology

## 2012-04-27 NOTE — Telephone Encounter (Signed)
Message copied by Benay Spice on Tue Apr 27, 2012 10:56 AM ------      Message from: Milas Gain      Created: Tue Apr 27, 2012  8:52 AM       Let Ms. Karbowski know that her L-spine MRI did look ok, mild arthritis, but nothing serious.

## 2012-04-27 NOTE — Telephone Encounter (Signed)
Called and left the patient a message stating MRI L-spine looked ok only showing mild arthritis. Told to call if additional questions.

## 2012-05-06 ENCOUNTER — Ambulatory Visit (HOSPITAL_BASED_OUTPATIENT_CLINIC_OR_DEPARTMENT_OTHER): Payer: Medicaid Other

## 2012-05-06 VITALS — BP 123/73 | HR 99 | Temp 98.3°F

## 2012-05-06 DIAGNOSIS — Z452 Encounter for adjustment and management of vascular access device: Secondary | ICD-10-CM

## 2012-05-06 DIAGNOSIS — C50919 Malignant neoplasm of unspecified site of unspecified female breast: Secondary | ICD-10-CM

## 2012-05-06 MED ORDER — HEPARIN SOD (PORK) LOCK FLUSH 100 UNIT/ML IV SOLN
500.0000 [IU] | Freq: Once | INTRAVENOUS | Status: AC
  Administered 2012-05-06: 500 [IU] via INTRAVENOUS
  Filled 2012-05-06: qty 5

## 2012-05-06 MED ORDER — SODIUM CHLORIDE 0.9 % IJ SOLN
10.0000 mL | INTRAMUSCULAR | Status: DC | PRN
  Administered 2012-05-06: 10 mL via INTRAVENOUS
  Filled 2012-05-06: qty 10

## 2012-05-06 NOTE — Patient Instructions (Signed)
Call MD for problems 

## 2012-05-17 ENCOUNTER — Encounter: Payer: Self-pay | Admitting: Family Medicine

## 2012-05-17 ENCOUNTER — Ambulatory Visit (INDEPENDENT_AMBULATORY_CARE_PROVIDER_SITE_OTHER): Payer: Medicaid Other | Admitting: Family Medicine

## 2012-05-17 VITALS — BP 148/87 | HR 121 | Ht 64.5 in | Wt 234.0 lb

## 2012-05-17 DIAGNOSIS — I158 Other secondary hypertension: Secondary | ICD-10-CM

## 2012-05-17 DIAGNOSIS — R110 Nausea: Secondary | ICD-10-CM

## 2012-05-17 DIAGNOSIS — C50919 Malignant neoplasm of unspecified site of unspecified female breast: Secondary | ICD-10-CM

## 2012-05-17 DIAGNOSIS — F3289 Other specified depressive episodes: Secondary | ICD-10-CM

## 2012-05-17 DIAGNOSIS — I159 Secondary hypertension, unspecified: Secondary | ICD-10-CM

## 2012-05-17 DIAGNOSIS — F329 Major depressive disorder, single episode, unspecified: Secondary | ICD-10-CM

## 2012-05-17 DIAGNOSIS — G8929 Other chronic pain: Secondary | ICD-10-CM

## 2012-05-17 DIAGNOSIS — R569 Unspecified convulsions: Secondary | ICD-10-CM

## 2012-05-17 LAB — POCT URINALYSIS DIPSTICK
Ketones, UA: NEGATIVE
Nitrite, UA: NEGATIVE
Protein, UA: NEGATIVE

## 2012-05-17 LAB — POCT UA - MICROSCOPIC ONLY

## 2012-05-17 MED ORDER — PROMETHAZINE HCL 25 MG/ML IJ SOLN
25.0000 mg | Freq: Once | INTRAMUSCULAR | Status: AC
  Administered 2012-05-17: 25 mg via INTRAMUSCULAR

## 2012-05-17 MED ORDER — HYDROCODONE-ACETAMINOPHEN 5-500 MG PO TABS: 1.0000 | ORAL_TABLET | Freq: Three times a day (TID) | ORAL | Status: AC | PRN

## 2012-05-17 MED ORDER — AMITRIPTYLINE HCL 100 MG PO TABS: 100.0000 mg | ORAL_TABLET | Freq: Every day | ORAL | Status: DC

## 2012-05-17 MED ORDER — ONDANSETRON HCL 4 MG PO TABS: 4.0000 mg | ORAL_TABLET | Freq: Three times a day (TID) | ORAL | Status: AC | PRN

## 2012-05-17 MED ORDER — HYDROCODONE-ACETAMINOPHEN 5-500 MG PO TABS: 1.0000 | ORAL_TABLET | Freq: Three times a day (TID) | ORAL | Status: DC | PRN

## 2012-05-17 MED ORDER — CYCLOBENZAPRINE HCL 10 MG PO TABS: 10.0000 mg | ORAL_TABLET | Freq: Two times a day (BID) | ORAL | Status: DC | PRN

## 2012-05-17 NOTE — Assessment & Plan Note (Signed)
Patient reports some benefit from Elavil.  Will increase from 75 to 100.  Continue Cymbalta.

## 2012-05-17 NOTE — Assessment & Plan Note (Signed)
Reviewed MRI with Dr. Denny Levy.  Her pain does not localize to L5/S1.  Do not feel that back surgery would benefit patient.   - refill Vicodin x 3 months, though I am concerned this medication is only marginally helpful for her pain - Refer to pain management - increase Elavil - Continue Lyrica and Cymbalta.

## 2012-05-17 NOTE — Progress Notes (Signed)
  Subjective:    Patient ID: Madison [name], female    DOB: 1976/03/09, 36 y.o.   MRN: [id number]  HPI  [name] comes in for follow up.   Depression- she says she feels better with Elavil, but has difficulty describing how it helped.  She is still taking Cymbalta as well.  She says that it is difficult to be in a good mood because of her pain. She denies SI/HI, but admits to feeling down many days of the week.   Pain- Pain is all over, neck, shoulders, knees, low back.  She had MRI of lumbar spine that showed some Arthritic changes and chronic foraminal disc protrusion with spur at left L5-S1.  The patient does describe occasional radicular symptoms bilateral, but does not localize pain specifically to that nerve distribution. She is taking Lyrica and vicodin in addition to antidepressants.  She says that some days she cannot move due to pain and the vicodin helps with that.    Nausea- has been feeling badly and had some nausea for about 3 days.  Has vomited a few times, and had poor fluid intake and appetite.  No fevers/chills/sick contacts.  Is concerned about a UTI because she has had pyelo in the past.   Constipation- says she has tried lots of OTC medications, but her mom says she should not be taking them.  Says she never goes regularly. BM only once or twice a week.  No blood in stool.   HTN- says it is elevated due to pain.  Says that when she is more relaxed, in less pain, BP is controlled.  She says she is not having chest pain, dyspnea, LE edema, palpitations.   Past Medical History  Diagnosis Date  . Breast CA   . Tobacco user   . Grand mal seizure   . Fibromyalgia   . Seizures    History  Substance Use Topics  . Smoking status: Current Everyday Smoker -- 1.0 packs/day    Types: Cigarettes  . Smokeless tobacco: Never Used   Comment: 1 ppd for 20 years   . Alcohol Use: Yes     minimal     Review of Systems Pertinent items in HPI.     Objective:   Physical Exam BP  148/87  Pulse 121  Ht 5' 4.5" (1.638 m)  Wt 234 lb (106.142 kg)  BMI 39.55 kg/m2 General appearance: alert, cooperative and tearful. Neck: no adenopathy, no carotid bruit, no JVD, supple, symmetrical, trachea midline and thyroid not enlarged, symmetric, no tenderness/mass/nodules Back: symmetric, no curvature. ROM normal. No CVA tenderness., Lumbar spine tender to palpation.  Lungs: clear to auscultation bilaterally Heart: regular rate and rhythm, S1, S2 normal, no murmur, click, rub or gallop Abdomen: soft, non-tender; bowel sounds normal; no masses,  no organomegaly Pulses: 2+ and symmetric       Assessment & Plan:

## 2012-05-17 NOTE — Patient Instructions (Signed)
I am sorry you are hurting so badly.  I am increasing your Elavil, which should help your mood and your pain.  I am increasing it slowly, we can increase it more if needed.  Your MRI of your back does not show anything that I (Or Dr. Denny Levy) think would be helped by surgery.  I am putting in a referral for you to see the [facility].  They are experts in managing pain.    I have sent a prescription to your pharmacy for Zofran for your nausea.   I want you to take Enid Baas for your constipation.  You can buy the generic (polyethylene glycol).  This is a non-habit forming stool softener, so you can start with one capful mixed in water twice a day, and increase as needed until you have regular bowel movements.

## 2012-05-17 NOTE — Assessment & Plan Note (Signed)
Elevated today but has been relatively well controlled. Will have her monitor it at home and consider increasing BP medications if it remains elevated.

## 2012-05-18 ENCOUNTER — Other Ambulatory Visit: Payer: Self-pay | Admitting: Family Medicine

## 2012-05-18 ENCOUNTER — Other Ambulatory Visit: Payer: Self-pay | Admitting: Oncology

## 2012-05-18 DIAGNOSIS — C50919 Malignant neoplasm of unspecified site of unspecified female breast: Secondary | ICD-10-CM

## 2012-05-27 ENCOUNTER — Telehealth: Payer: Self-pay | Admitting: Family Medicine

## 2012-05-27 NOTE — Telephone Encounter (Signed)
Pt is still constipated and she is up to 3 times daily w/ Miralax.  Wants to know what else she can do.

## 2012-05-28 NOTE — Telephone Encounter (Signed)
Called Madison [name] back- let her know it is OK to increase Miralax, just needs to drink with a lot of water too.  Told her she can increase to two cap fulls three times a day.    She told me that after her last visit she got her new Rx for the elavil and was looking in her pill box and realized she had not put her elavil or /cymbalta in the box.  Thinks this is why she was so tearful.    She has not heard from pain management yet.  I told her I will look into the referral.

## 2012-05-31 ENCOUNTER — Encounter: Payer: Self-pay | Admitting: Physical Medicine & Rehabilitation

## 2012-06-02 ENCOUNTER — Other Ambulatory Visit: Payer: Self-pay | Admitting: Neurology

## 2012-06-02 ENCOUNTER — Telehealth: Payer: Self-pay | Admitting: Neurology

## 2012-06-02 DIAGNOSIS — C50919 Malignant neoplasm of unspecified site of unspecified female breast: Secondary | ICD-10-CM

## 2012-06-02 MED ORDER — ZONISAMIDE 100 MG PO CAPS: ORAL_CAPSULE | ORAL | Status: DC

## 2012-06-02 NOTE — Telephone Encounter (Signed)
Spoke with Maralyn Sago. Information given. Will call in refill as well to the [facility] on [address] in [place].

## 2012-06-02 NOTE — Telephone Encounter (Signed)
Received a "Call-a-nurse" report re: call from the patient's mom, Madison [name] to report that her daughter had a seizure. I called and spoke to Madison [name]. She reports Madison [name] had a seizure in the car yesterday evening; not a grad mal and short lived; did have jerking lasting less than a minute followed by profuse crying. The crying was not typical. Turned around and drove back home and she had a second seizure in the car; again lasted less than a minute. Had assistance getting her in the house. Was not incontinent and no tongue biting. Had Valium. Did fine the remainder of the evening and during the night. Is sore today but otherwise fine. No obvious reason for the seizure--no UTI, no sinus issues. Has not missed any medications. Madison [name] did not feel it was necessary to make a sooner appointment; has f/u scheduled 06/28/12. Asked that she keep [place] posted and to call if additional seizures. She states she will. **Dr. Modesto Charon, Lorain Childes......Marland Kitchen

## 2012-06-02 NOTE — Telephone Encounter (Signed)
I would increase her zonisamide in the meantime to 400mg  qhs(I have that she was taking 300mg  qhs).

## 2012-06-04 ENCOUNTER — Encounter: Payer: Self-pay | Admitting: Physical Medicine & Rehabilitation

## 2012-06-04 ENCOUNTER — Ambulatory Visit (HOSPITAL_BASED_OUTPATIENT_CLINIC_OR_DEPARTMENT_OTHER): Payer: Medicaid Other | Admitting: Physical Medicine & Rehabilitation

## 2012-06-04 ENCOUNTER — Encounter: Payer: Medicaid Other | Attending: Physical Medicine & Rehabilitation

## 2012-06-04 VITALS — BP 149/92 | HR 97 | Ht 65.0 in | Wt 243.6 lb

## 2012-06-04 DIAGNOSIS — M542 Cervicalgia: Secondary | ICD-10-CM | POA: Insufficient documentation

## 2012-06-04 DIAGNOSIS — IMO0001 Reserved for inherently not codable concepts without codable children: Secondary | ICD-10-CM

## 2012-06-04 DIAGNOSIS — G894 Chronic pain syndrome: Secondary | ICD-10-CM | POA: Insufficient documentation

## 2012-06-04 DIAGNOSIS — M79609 Pain in unspecified limb: Secondary | ICD-10-CM | POA: Insufficient documentation

## 2012-06-04 DIAGNOSIS — M25559 Pain in unspecified hip: Secondary | ICD-10-CM | POA: Insufficient documentation

## 2012-06-04 DIAGNOSIS — M47817 Spondylosis without myelopathy or radiculopathy, lumbosacral region: Secondary | ICD-10-CM

## 2012-06-04 DIAGNOSIS — T451X5A Adverse effect of antineoplastic and immunosuppressive drugs, initial encounter: Secondary | ICD-10-CM | POA: Insufficient documentation

## 2012-06-04 DIAGNOSIS — R209 Unspecified disturbances of skin sensation: Secondary | ICD-10-CM | POA: Insufficient documentation

## 2012-06-04 DIAGNOSIS — M76899 Other specified enthesopathies of unspecified lower limb, excluding foot: Secondary | ICD-10-CM

## 2012-06-04 DIAGNOSIS — G620 Drug-induced polyneuropathy: Secondary | ICD-10-CM | POA: Insufficient documentation

## 2012-06-04 DIAGNOSIS — M25569 Pain in unspecified knee: Secondary | ICD-10-CM | POA: Insufficient documentation

## 2012-06-04 DIAGNOSIS — Z79899 Other long term (current) drug therapy: Secondary | ICD-10-CM | POA: Insufficient documentation

## 2012-06-04 DIAGNOSIS — M2240 Chondromalacia patellae, unspecified knee: Secondary | ICD-10-CM

## 2012-06-04 DIAGNOSIS — M549 Dorsalgia, unspecified: Secondary | ICD-10-CM | POA: Insufficient documentation

## 2012-06-04 NOTE — Patient Instructions (Signed)
1. Fibromyalgia syndrome this is accounting for a lot of the soreness in the muscles in the neck back limbs.recommend aquatic program for fibromyalgia at [facility]. Given that her last seizure was 2 days ago may need to get some extra clearance. The patient will call and check on the 2. Back pain this may be related to fibromyalgia but also can be related to lumbar spondylosis which was seen at the L4-L5 and L5-S1 levels on the lumbar MRI. I will schedule for lumbar medial branch blocks L3-L4-L5 3. 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 Will check an x-ray. I will have her followup with assistant Clydie Braun who can do an injection and instruct further exercise 4. 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 She is on multiple medications that can help with this. 5. Hip pain this may be fibromyalgia related however given her history of being sedentary can also be bursitis. I will inject right side today

## 2012-06-04 NOTE — Progress Notes (Signed)
Subjective:    Patient ID: Madison [name], female    DOB: October 19, 1976, 36 y.o.   MRN: [id number]  HPI  36 year old female with history of breast cancer. She has undergone mastectomy. She had chemotherapy and developed neuropathy as a result. She also has complaints of all over body aching. Intermittent spasms. She has neck pain shoulder pain low back pain and hip pain as well as right knee pain as the particular areas that bother her. She's had MRI of the lumbar spine with results listed below. Predominant finding is lumbar facet arthropathy. She has not had a x-ray of her right knee. She is afraid that this may buckle on her and cause her to fall. Other past medical history is significant for seizure disorder. She sees neurology. She is scheduled to see Dr. Modesto Charon again for this. Her last seizure was 2 days ago.  Pain Inventory Average Pain 8 Pain Right Now 8 My pain is intermittent, constant, burning, dull, stabbing, tingling and aching  In the last 24 hours, has pain interfered with the following? General activity 7 Relation with others 5 Enjoyment of life 8 What TIME of day is your pain at its worst? all of the time Sleep (in general) Good with meds  Pain is worse with: walking, bending, sitting, inactivity, standing and some activites Pain improves with: rest, heat/ice and medication Relief from Meds: 7  Mobility use a cane use a walker how many minutes can you walk? 15 ability to climb steps?  yes do you drive?  no needs help with transfers  Function disabled: date disabled in process I need assistance with the following:  dressing, meal prep, household duties and shopping  Neuro/Psych weakness numbness tremor tingling spasms dizziness depression anxiety  Prior Studies Any changes since last visit?  no new patient IMPRESSION:  1. Mild lower lumbar spondylosis with disc bulging and facet  disease as described. Compared with abdominal CT 06/11/2009, no  acute  findings are identified. The facet disease is asymmetric to  the right and worst at L4-L5 and L5-S1.  2. 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  3. No evidence of fracture, metastatic disease or malalignment. Physicians involved in your care Primary care Ardyth Gal Neurologist Dr Denton Meek Dr Spero Geralds -therapist, Dr Pierce Crane Oncologist   History reviewed. No pertinent family history. History   Social History  . Marital Status: Legally Separated    Spouse Name: N/A    Number of Children: N/A  . Years of Education: N/A   Social History Main Topics  . Smoking status: Current Everyday Smoker -- 1.0 packs/day    Types: Cigarettes  . Smokeless tobacco: Never Used   Comment: 1 ppd for 20 years   . Alcohol Use: Yes     minimal   . Drug Use: None  . Sexually Active: None   Other Topics Concern  . None   Social History Narrative   Living w parents in [place] due to cancer and inability to take car for herself or others. Has 3 children. 39 yo has bipolar and MR, currently in foster home. Other 2 live with her. Divorced.    Past Surgical History  Procedure Date  . Appendectomy   . Tonsillectomy   . Tubal ligation   . Mastectomy     bilateral  . Cholecystectomy March 2011  . Abdominal hysterectomy March 2012  . Breast  surgery     bilateral mastectomy  . Port-a-cath insertion    Past Medical History  Diagnosis Date  . Tobacco user   . Grand mal seizure   . Fibromyalgia   . Seizures   . Breast CA    BP 149/92  Pulse 97  Ht 5\' 5"  (1.651 m)  Wt 243 lb 9.6 oz (110.496 kg)  BMI 40.54 kg/m2  SpO2 94%    Review of Systems  Constitutional: Positive for unexpected weight change.  Gastrointestinal: Positive for nausea and constipation.  Musculoskeletal: Positive for back pain and gait problem.       Spasms  Neurological: Positive for dizziness, tremors,  weakness and numbness.       Tingling  Psychiatric/Behavioral: Positive for dysphoric mood. The patient is nervous/anxious.   All other systems reviewed and are negative.       Objective:   Physical Exam  Constitutional: She is oriented to person, place, and time. She appears well-developed.       obese  HENT:  Head: Normocephalic and atraumatic.  Neck: Normal range of motion.  Musculoskeletal:       Right hip: She exhibits tenderness. She exhibits normal range of motion.       Left hip: She exhibits tenderness. She exhibits normal range of motion.       Right knee: She exhibits normal range of motion, no swelling, no effusion, no deformity, no LCL laxity, normal patellar mobility and no MCL laxity. tenderness found. Medial joint line and patellar tendon tenderness noted.       Cervical back: She exhibits decreased range of motion and tenderness. She exhibits no swelling, no edema, no deformity and no spasm.       Tenderness at multiple fibromyalgia tender points. 2 at suboccipital  Neurological: She is alert and oriented to person, place, and time. She has normal strength. She displays no atrophy. A sensory deficit is present. She exhibits normal muscle tone. Gait abnormal.  Reflex Scores:      Tricep reflexes are 2+ on the right side and 2+ on the left side.      Bicep reflexes are 2+ on the right side and 2+ on the left side.      Brachioradialis reflexes are 2+ on the right side and 2+ on the left side.      Patellar reflexes are 2+ on the right side and 2+ on the left side.      Achilles reflexes are 2+ on the right side and 2+ on the left side.      Sensation reduced in a glove and stocking pattern.  Psychiatric: Her speech is normal and behavior is normal. Judgment and thought content normal. Her mood appears anxious. Her affect is labile. Cognition and memory are normal.          Assessment & Plan:  1. Fibromyalgia syndrome this is accounting for a lot of the soreness in  the muscles in the neck back limbs.recommend aquatic program for fibromyalgia at [facility]. Given that her last seizure was 2 days ago may need to get some extra clearance. The patient will call and check on the 2. Back pain this may be related to fibromyalgia but also can be related to lumbar spondylosis which was seen at the L4-L5 and L5-S1 levels on the lumbar MRI. I will schedule for lumbar medial branch blocks L3-L4-L5 3. 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 Will check an x-ray. I will have  her followup with assistant Clydie Braun who can do an injection and instruct further exercise 4. 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 She is on multiple medications that can help with this. 5. Hip pain this may be fibromyalgia related however given her history of being sedentary can also be bursitis. I will inject right side today 6. Chronic pain syndrome: She is a hearty on numerous pain medications. I do not think adding another will be of any benefit. [facility] will focus on injections as well as physical therapy.I discussed this with the patient and she agrees with my plan.  Over half of the 60 minute visit was spent counseling coordination of care  Right hip trochanteric bursa injection Indication right trochanteric bursitis already on multiple pain medications Informed consent was obtained after describing risk and benefits of the procedure with the patient these include bleeding bruising and infection she elects to proceed and has given written consent. Patient placed in a left lateral decubitus position right hip palpated marked and prepped with Betadine alcohol and with a 21-gauge 2 inch needle inserted to bone contact injected 1 cc of 40 November cc Depo-Medrol and 4 cc 1% lidocaine. Patient tolerated procedure well. Post procedure instructions given

## 2012-06-09 ENCOUNTER — Ambulatory Visit
Admission: RE | Admit: 2012-06-09 | Discharge: 2012-06-09 | Disposition: A | Discharge status: Home / Self Care | Payer: Medicaid Other | Source: Ambulatory Visit | Attending: Physical Medicine & Rehabilitation | Admitting: Physical Medicine & Rehabilitation

## 2012-06-09 DIAGNOSIS — M2240 Chondromalacia patellae, unspecified knee: Secondary | ICD-10-CM

## 2012-06-21 ENCOUNTER — Telehealth: Payer: Self-pay | Admitting: Neurology

## 2012-06-21 ENCOUNTER — Encounter: Payer: Self-pay | Admitting: Physical Medicine and Rehabilitation

## 2012-06-21 ENCOUNTER — Encounter: Payer: Medicaid Other | Attending: Physical Medicine & Rehabilitation | Admitting: Physical Medicine and Rehabilitation

## 2012-06-21 VITALS — BP 165/95 | HR 90 | Resp 16 | Ht 64.0 in | Wt 241.0 lb

## 2012-06-21 DIAGNOSIS — R209 Unspecified disturbances of skin sensation: Secondary | ICD-10-CM | POA: Insufficient documentation

## 2012-06-21 DIAGNOSIS — M549 Dorsalgia, unspecified: Secondary | ICD-10-CM | POA: Insufficient documentation

## 2012-06-21 DIAGNOSIS — Z853 Personal history of malignant neoplasm of breast: Secondary | ICD-10-CM | POA: Insufficient documentation

## 2012-06-21 DIAGNOSIS — R5381 Other malaise: Secondary | ICD-10-CM | POA: Insufficient documentation

## 2012-06-21 DIAGNOSIS — M76899 Other specified enthesopathies of unspecified lower limb, excluding foot: Secondary | ICD-10-CM | POA: Insufficient documentation

## 2012-06-21 DIAGNOSIS — M542 Cervicalgia: Secondary | ICD-10-CM | POA: Insufficient documentation

## 2012-06-21 DIAGNOSIS — F29 Unspecified psychosis not due to a substance or known physiological condition: Secondary | ICD-10-CM | POA: Insufficient documentation

## 2012-06-21 DIAGNOSIS — M25511 Pain in right shoulder: Secondary | ICD-10-CM

## 2012-06-21 DIAGNOSIS — M25569 Pain in unspecified knee: Secondary | ICD-10-CM | POA: Insufficient documentation

## 2012-06-21 DIAGNOSIS — T451X5A Adverse effect of antineoplastic and immunosuppressive drugs, initial encounter: Secondary | ICD-10-CM | POA: Insufficient documentation

## 2012-06-21 DIAGNOSIS — M797 Fibromyalgia: Secondary | ICD-10-CM

## 2012-06-21 DIAGNOSIS — R42 Dizziness and giddiness: Secondary | ICD-10-CM | POA: Insufficient documentation

## 2012-06-21 DIAGNOSIS — G40909 Epilepsy, unspecified, not intractable, without status epilepticus: Secondary | ICD-10-CM

## 2012-06-21 DIAGNOSIS — IMO0001 Reserved for inherently not codable concepts without codable children: Secondary | ICD-10-CM | POA: Insufficient documentation

## 2012-06-21 DIAGNOSIS — M25559 Pain in unspecified hip: Secondary | ICD-10-CM | POA: Insufficient documentation

## 2012-06-21 DIAGNOSIS — M25519 Pain in unspecified shoulder: Secondary | ICD-10-CM | POA: Insufficient documentation

## 2012-06-21 DIAGNOSIS — G620 Drug-induced polyneuropathy: Secondary | ICD-10-CM | POA: Insufficient documentation

## 2012-06-21 DIAGNOSIS — M79609 Pain in unspecified limb: Secondary | ICD-10-CM | POA: Insufficient documentation

## 2012-06-21 DIAGNOSIS — M47817 Spondylosis without myelopathy or radiculopathy, lumbosacral region: Secondary | ICD-10-CM | POA: Insufficient documentation

## 2012-06-21 DIAGNOSIS — Z79899 Other long term (current) drug therapy: Secondary | ICD-10-CM | POA: Insufficient documentation

## 2012-06-21 DIAGNOSIS — G894 Chronic pain syndrome: Secondary | ICD-10-CM | POA: Insufficient documentation

## 2012-06-21 NOTE — Patient Instructions (Signed)
Continue walking , continue with exercises

## 2012-06-21 NOTE — Progress Notes (Signed)
Subjective:    Patient ID: Madison [name], female    DOB: September 05, 1976, 36 y.o.   MRN: [id number]  HPI 36 year old female with history of breast cancer. She has undergone mastectomy. She had chemotherapy and developed neuropathy as a result. She also has complaints of all over body aching. Intermittent spasms. She has neck pain shoulder pain low back pain and hip pain as well as right knee pain as the particular areas that bother her.  She's had MRI of the lumbar spine with results listed below. Predominant finding is lumbar facet arthropathy.  She has had a x-ray of her right knee, which was without significant findings.   Other past medical history is significant for seizure disorder. She sees Dr. Modesto Charon. Her last seizure was 2 days ago. She also complains about right shoulder pain, she states that she had her on the right shoulder during the last seizures.  Pain Inventory Average Pain 8 Pain Right Now 9 My pain is burning and aching  In the last 24 hours, has pain interfered with the following? General activity 8 Relation with others 6 Enjoyment of life 10 What TIME of day is your pain at its worst? night Sleep (in general) Good  Pain is worse with: walking, bending, standing and some activites Pain improves with: rest, heat/ice, therapy/exercise, pacing activities and medication Relief from Meds: 9  Mobility use a cane use a walker ability to climb steps?  yes do you drive?  no  Function not employed: date last employed  I need assistance with the following:  household duties and shopping  Neuro/Psych weakness numbness tremor tingling spasms dizziness confusion depression anxiety loss of taste or smell  Prior Studies Any changes since last visit?  no  Physicians involved in your care Any changes since last visit?  no   History reviewed. No pertinent family history. History   Social History  . Marital Status: Legally Separated    Spouse Name: N/A   Number of Children: N/A  . Years of Education: N/A   Social History Main Topics  . Smoking status: Current Everyday Smoker -- 1.0 packs/day    Types: Cigarettes  . Smokeless tobacco: Never Used   Comment: 1 ppd for 20 years   . Alcohol Use: Yes     minimal   . Drug Use: None  . Sexually Active: None   Other Topics Concern  . None   Social History Narrative   Living w parents in [place] due to cancer and inability to take car for herself or others. Has 3 children. 74 yo has bipolar and MR, currently in foster home. Other 2 live with her. Divorced.    Past Surgical History  Procedure Date  . Appendectomy   . Tonsillectomy   . Tubal ligation   . Mastectomy     bilateral  . Cholecystectomy March 2011  . Abdominal hysterectomy March 2012  . Breast surgery     bilateral mastectomy  . Port-a-cath insertion    Past Medical History  Diagnosis Date  . Tobacco user   . Grand mal seizure   . Fibromyalgia   . Seizures   . Breast CA    BP 165/95  Pulse 90  Resp 16  Ht 5\' 4"  (1.626 m)  Wt 241 lb (109.317 kg)  BMI 41.37 kg/m2  SpO2 98%     Review of Systems  HENT: Negative.   Eyes: Negative.   Respiratory: Negative.   Cardiovascular: Negative.   Gastrointestinal: Negative.  Genitourinary: Negative.   Musculoskeletal: Positive for back pain.  Skin: Negative.   Neurological: Positive for dizziness, weakness and numbness.  Psychiatric/Behavioral: Positive for confusion.       Objective:   Physical Exam Constitutional: She is oriented to person, place, and time. She appears well-developed.  obese  HENT:  Head: Normocephalic and atraumatic.  Neck: Normal range of motion.  Musculoskeletal: Right shoulder: passive full ROM with some pain at end range of motion  Right hip: She exhibits tenderness. She exhibits normal range of motion.  Left hip: She exhibits tenderness. She exhibits normal range of motion.  Right knee: She exhibits normal range of motion, no swelling,  no effusion, no deformity, no LCL laxity, normal patellar mobility and no MCL laxity. tenderness found. Medial joint line and patellar tendon tenderness noted.  Cervical back: She exhibits decreased range of motion and tenderness. She exhibits no swelling, no edema, no deformity and no spasm.  Tenderness at multiple fibromyalgia tender points. 2 at suboccipital  Neurological: She is alert and oriented to person, place, and time. She has normal strength. She displays no atrophy. A sensory deficit is present. She exhibits normal muscle tone. Gait abnormal.  Reflex Scores:  Tricep reflexes are 2+ on the right side and 2+ on the left side.  Bicep reflexes are 2+ on the right side and 2+ on the left side.  Brachioradialis reflexes are 2+ on the right side and 2+ on the left side.  Patellar reflexes are 2+ on the right side and 2+ on the left side.  Achilles reflexes are 2+ on the right side and 2+ on the left side. Sensation reduced in a glove and stocking pattern.  Psychiatric: Her speech is normal and behavior is normal. Judgment and thought content normal. Her mood appears anxious. Her affect is labile. Cognition and memory are normal.  Tenderness over trochanteric bursa on the left.        Assessment & Plan:  1. Fibromyalgia syndrome this is accounting for a lot of the soreness in the muscles in the neck back limbs.Recommend aquatic program for fibromyalgia at [facility], patient could not attend , because of seizure disorder.  2. Back pain this may be related to fibromyalgia but also can be related to lumbar spondylosis which was seen at the L4-L5 and L5-S1 levels on the lumbar MRI. I will schedule for lumbar medial branch blocks L3-L4-L5  3. Right knee pain , x-ray without significant findings.Prescribed PT in the water, added in my order, that patient has a seizure disorder, and needs a person in the water with her at all times, if that is not possible, will order PT on land..  4. Neuropathy pain  and numbness due to chemotherapy. She is on multiple medications that can help with this.  5. Hip pain on the left trochanteric bursa, right side has improved after injection at last visit.Injected left bursa today.  6. Chronic pain syndrome: She is already on numerous pain medications. I do not think adding another will be of any benefit. [facility] clinic will focus on injections as well as physical therapy.I discussed this with the patient and she agrees with my plan.

## 2012-06-21 NOTE — Telephone Encounter (Signed)
Picked up a call from the patient. She called to report that she had two seizures on Saturday. The first one was in the car at about 2 pm. Lasted for approximately 1 1/2 minutes. No incontinence. Described as guttural sound first followed by stiffening of the limbs. Was in her seat belt; her dad and daughter were also in the car. Took the orally disintegrating Clonazepam afterward. The next one was at about 7 pm. She was on her dad's front porch. Lasted about 2 minutes. Lost control of bladder. No falls, no injury. Description was similar. Again took the Clonazepam. Currently on Keppra 4000 mg daily as well as the Zonegran 400 mg at HS. Denies missed doses of either. No additional stress, no loss of sleep and no alcohol consumption. She has a f/u with Dr. Wong on 06/28/12. I told her that I would let Dr. Wong know about her seizures and that I would let her know if he had any additional information to share with her. She is ok with this plan. **Dr. Wong, please advise. 

## 2012-06-21 NOTE — Telephone Encounter (Signed)
please increase her zonisamide to 100mg  in a.m., 400mg  at night.

## 2012-06-21 NOTE — Telephone Encounter (Signed)
Spoke with [employer]. Information given as per Dr. Modesto Charon below. Patient will increase the Zonegran as directed. Will see Dr. Modesto Charon next Monday for f/u. Asked that she call us if further seizures.

## 2012-06-21 NOTE — Telephone Encounter (Signed)
Picked up a call from the patient. She called to report that she had two seizures on Saturday. The first one was in the car at about 2 pm. Lasted for approximately 1 1/2 minutes. No incontinence. Described as guttural sound first followed by stiffening of the limbs. Was in her seat belt; her dad and daughter were also in the car. Took the orally disintegrating Clonazepam afterward. The next one was at about 7 pm. She was on her dad's front porch. Lasted about 2 minutes. Lost control of bladder. No falls, no injury. Description was similar. Again took the Clonazepam. Currently on Keppra 4000 mg daily as well as the Zonegran 400 mg at HS. Denies missed doses of either. No additional stress, no loss of sleep and no alcohol consumption. She has a f/u with Dr. Modesto Charon on 06/28/12. I told her that I would let Dr. Modesto Charon know about her seizures and that I would let her know if he had any additional information to share with her. She is ok with this plan. **Dr. Modesto Charon, please advise.

## 2012-06-22 ENCOUNTER — Telehealth: Payer: Self-pay | Admitting: *Deleted

## 2012-06-22 NOTE — Telephone Encounter (Signed)
Message copied by Doreene Eland on Tue Jun 22, 2012  8:59 AM ------      Message from: Madison [name]      Created: Mon Jun 21, 2012  5:10 PM       Normal knee xray      Can discuss at next visit

## 2012-06-22 NOTE — Telephone Encounter (Signed)
Notified. 

## 2012-06-22 NOTE — Telephone Encounter (Signed)
Notified Ms Leipold of knee xray report results.

## 2012-06-22 NOTE — Telephone Encounter (Signed)
Message copied by Doreene Eland on Tue Jun 22, 2012  9:02 AM ------      Message from: Erick Colace      Created: Mon Jun 21, 2012  5:10 PM       Normal knee xray      Can discuss at next visit

## 2012-06-23 ENCOUNTER — Telehealth: Payer: Self-pay | Admitting: Physical Medicine and Rehabilitation

## 2012-06-23 NOTE — Progress Notes (Signed)
Clydie Braun, Please dictate an addendum for your injection if you performed one Thanks AK MD

## 2012-06-23 NOTE — Telephone Encounter (Signed)
"  [facility] does not have anyone/therapist that will actually be in the pool with patient. Please advise on referral.

## 2012-06-24 NOTE — Telephone Encounter (Signed)
I'm not sure what it is that you need.

## 2012-06-28 ENCOUNTER — Encounter: Payer: Self-pay | Admitting: Neurology

## 2012-06-28 ENCOUNTER — Ambulatory Visit (INDEPENDENT_AMBULATORY_CARE_PROVIDER_SITE_OTHER): Payer: Medicaid Other | Admitting: Neurology

## 2012-06-28 VITALS — HR 112 | Ht 64.0 in | Wt 245.0 lb

## 2012-06-28 DIAGNOSIS — C50919 Malignant neoplasm of unspecified site of unspecified female breast: Secondary | ICD-10-CM

## 2012-06-28 MED ORDER — CLONAZEPAM 0.5 MG PO TABS: 0.5000 mg | ORAL_TABLET | Freq: Two times a day (BID) | ORAL | Status: DC

## 2012-06-28 MED ORDER — ZONISAMIDE 100 MG PO CAPS: ORAL_CAPSULE | ORAL | Status: DC

## 2012-06-28 MED ORDER — CLONAZEPAM 1 MG PO TBDP: 2.0000 mg | ORAL_TABLET | Freq: Two times a day (BID) | ORAL | Status: DC | PRN

## 2012-06-28 NOTE — Progress Notes (Signed)
Dear Dr. Lula Olszewski,  I saw  Madison [name] back in [facility] for her problem with idiopathic.  As you may recall, she is a 36 y.o. 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  When I first met her we did not change her medications.  However, in the interim she has had 2 clusters of 2 seizures.  These were unprovoked.  They were significantly smaller than her GTCS and involved questionable right head version and left arm and leg extension.  There was no eye deviation noted.  She was confused afterwards.  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  She has not noted any changes in her gait.  She does have significant memory problems, but they do not appear to be worse.  She continues to have chronic headache.  She is taking ibuprofen or tylenol, probably 15 days per month.  She drinks at least two caffeinated beverages a day.  MRI brain was normal in the past.  Routine EEGs were normal.  EMU EEGs revealed generalized spike and wave activity.  I have recently increased her zonisamide to 100/400 but this is making her too tired during the day.  Medical history, social history, and family history were reviewed and have not changed since the last clinic visit.  Current Outpatient Prescriptions on File Prior to Visit  Medication Sig Dispense Refill  . amitriptyline (ELAVIL) 100 MG tablet Take 1 tablet (100 mg total) by mouth at bedtime.  30 tablet  5  . amLODipine (NORVASC) 10 MG tablet Take 1 tablet (10 mg total) by mouth daily.  30 tablet  11  . anastrozole (ARIMIDEX) 1 MG tablet TAKE 1 TABLET BY MOUTH ONCE A DAY  30 tablet  1  . captopril-hydrochlorothiazide (CAPOZIDE) 25-15 MG per tablet Take 1 tablet by mouth daily.  30 tablet  11  . cyclobenzaprine (FLEXERIL) 10 MG tablet Take 1  tablet (10 mg total) by mouth 2 (two) times daily as needed for muscle spasms. For muscle spasm  60 tablet  5  . CYMBALTA 60 MG capsule TAKE 1 CAPSULE BY MOUTH ONCE A DAY  30 capsule  11  . Diazepam (DIASTAT ACUDIAL) 20 MG GEL Place 20 mg rectally as needed (for seizures longer than 5 minutes or seizures clusters.).  2 each  3  . diclofenac sodium (VOLTAREN) 1 % GEL Apply 1 application topically 3 (three) times daily as needed (for joint pain).  100 g  3  . esomeprazole (NEXIUM) 20 MG capsule Take 1 capsule (20 mg total) by mouth daily before breakfast.  30 capsule  11  . fluticasone (FLONASE) 50 MCG/ACT nasal spray Place 2 sprays into the nose daily.  16 g  12  . HYDROcodone-acetaminophen (VICODIN) 5-500 MG per tablet Take 1 tablet by mouth every 8 (eight) hours as needed for pain.  90 tablet  0  . levETIRAcetam (KEPPRA) 1000 MG tablet Take 2,000 mg by mouth 2 (two) times daily. Rx by Dr. Inez Catalina, [facility]      . lidocaine (XYLOCAINE) 2 % solution Take 20 mLs by mouth as needed. Pt states she uses after a seizure.      . pregabalin (LYRICA) 150 MG capsule Take 150 mg by mouth 2 (two) times daily. Rx by Dr. Inez Catalina, [facility]       . pyridOXINE (  VITAMIN B-6) 100 MG tablet Take 100 mg by mouth daily. To help with side effects of Keppra, per pt.      . ranitidine (ZANTAC) 150 MG tablet Take 150 mg by mouth as needed.       Marland Kitchen DISCONTD: clonazePAM (KLONOPIN) 0.5 MG tablet Take 0.5 mg by mouth 2 (two) times daily.       Marland Kitchen DISCONTD: clonazePAM (KLONOPIN) 1 MG disintegrating tablet Take 2 mg by mouth 2 (two) times daily as needed.      Marland Kitchen DISCONTD: zonisamide (ZONEGRAN) 100 MG capsule Take 4 tablets (400 mg total) every night at bedtime.  120 capsule  6  . DISCONTD: amLODipine (NORVASC) 10 MG tablet Take 10 mg by mouth every morning.         Current Facility-Administered Medications on File Prior to Visit  Medication Dose Route Frequency Provider Last Rate Last Dose  . heparin lock flush 100 unit/mL   500 Units Intravenous Once Pierce Crane, MD      . sodium chloride 0.9 % injection 10 mL  10 mL Intravenous PRN Pierce Crane, MD        Allergies  Allergen Reactions  . Taxol (Paclitaxel)   . Tramadol   . Ultram (Tramadol Hcl)     ROS:  13 systems were reviewed and are notable for memory loss as above.  All other review of systems are unremarkable.  Exam: . Filed Vitals:   06/28/12 1020  Pulse: 112  Height: 5\' 4"  (1.626 m)  Weight: 245 lb (111.131 kg)    In general, well appearing women.    Impression/Recommendations:  1.  Idiopathic generalized epilepsy - Her small seizures have focal features, making me curious about the possibility that she has rapid bilateral synchrony.  We are going to switch her zonisamide to 500 qhs, and leave her on Keppra 2000 bid.  I would likely add Lamictal if she does not tolerate higher doses of ZNS. 2.  Chronic headache - she is going to try Aleve 400 bid for 14 days and stop all other medications.  If she cannot tolerate this we can consider a steroid taper. 3.  Memory loss - likely medication related 4.  Sensory neuropathy - stable sympomatically 5.  Fibromyalgia - patient carries a diagnosis of this - will prescribe her pregabalin for this.  We will see the patient back in 3 months.  Lupita Raider Modesto Charon, MD [facility], [place]

## 2012-07-01 ENCOUNTER — Encounter: Payer: Medicaid Other | Attending: Physical Medicine & Rehabilitation

## 2012-07-01 ENCOUNTER — Encounter: Payer: Self-pay | Admitting: Physical Medicine & Rehabilitation

## 2012-07-01 ENCOUNTER — Ambulatory Visit (HOSPITAL_BASED_OUTPATIENT_CLINIC_OR_DEPARTMENT_OTHER): Payer: Medicaid Other | Admitting: Physical Medicine & Rehabilitation

## 2012-07-01 VITALS — BP 140/84 | HR 94 | Resp 14 | Ht 64.0 in | Wt 246.0 lb

## 2012-07-01 DIAGNOSIS — T451X5A Adverse effect of antineoplastic and immunosuppressive drugs, initial encounter: Secondary | ICD-10-CM | POA: Insufficient documentation

## 2012-07-01 DIAGNOSIS — R209 Unspecified disturbances of skin sensation: Secondary | ICD-10-CM | POA: Insufficient documentation

## 2012-07-01 DIAGNOSIS — M549 Dorsalgia, unspecified: Secondary | ICD-10-CM | POA: Insufficient documentation

## 2012-07-01 DIAGNOSIS — G894 Chronic pain syndrome: Secondary | ICD-10-CM | POA: Insufficient documentation

## 2012-07-01 DIAGNOSIS — M25569 Pain in unspecified knee: Secondary | ICD-10-CM | POA: Insufficient documentation

## 2012-07-01 DIAGNOSIS — M542 Cervicalgia: Secondary | ICD-10-CM | POA: Insufficient documentation

## 2012-07-01 DIAGNOSIS — G620 Drug-induced polyneuropathy: Secondary | ICD-10-CM | POA: Insufficient documentation

## 2012-07-01 DIAGNOSIS — M79609 Pain in unspecified limb: Secondary | ICD-10-CM | POA: Insufficient documentation

## 2012-07-01 DIAGNOSIS — M76899 Other specified enthesopathies of unspecified lower limb, excluding foot: Secondary | ICD-10-CM | POA: Insufficient documentation

## 2012-07-01 DIAGNOSIS — IMO0001 Reserved for inherently not codable concepts without codable children: Secondary | ICD-10-CM | POA: Insufficient documentation

## 2012-07-01 DIAGNOSIS — M25559 Pain in unspecified hip: Secondary | ICD-10-CM | POA: Insufficient documentation

## 2012-07-01 DIAGNOSIS — M47817 Spondylosis without myelopathy or radiculopathy, lumbosacral region: Secondary | ICD-10-CM | POA: Insufficient documentation

## 2012-07-01 DIAGNOSIS — Z79899 Other long term (current) drug therapy: Secondary | ICD-10-CM | POA: Insufficient documentation

## 2012-07-01 NOTE — Progress Notes (Signed)
Bilateral Lumbar L3, L4  medial branch blocks and L 5 dorsal ramus injection under fluoroscopic guidance  Indication: Lumbar pain which is not relieved by medication management or other conservative care and interfering with self-care and mobility.  Informed consent was obtained after describing risks and benefits of the procedure with the patient, this includes bleeding, infection, paralysis and medication side effects.  The patient wishes to proceed and has given written consent.  The patient was placed in prone position.  The lumbar area was marked and prepped with Betadine.  One mL of 1% lidocaine was injected into each of 6 areas into the skin and subcutaneous tissue.  Then a 22-gauge 5 in spinal needle was inserted targeting the junction of the left S1 superior articular process and sacral ala junction. Needle was advanced under fluoroscopic guidance.  Bone contact was made.  Omnipaque 180 was injected x 0.5 mL demonstrating no intravascular uptake.  Then a solution containing one mL of 4 mg per mL dexamethasone and 3 mL of 2% MPF lidocaine was injected x 0.5 mL.  Then the left L5 superior articular process in transverse process junction was targeted.  Bone contact was made.  Omnipaque 180 was injected x 0.5 mL demonstrating no intravascular uptake. Then a solution containing one mL of 4 mg per mL dexamethasone and 3 mL of 2% MPF lidocaine was injected x 0.5 mL.  Then the left L4 superior articular process in transverse process junction was targeted.  Bone contact was made.  Omnipaque 180 was injected x 0.5 mL demonstrating no intravascular uptake.  Then a solution containing one mL of 4 mg per mL dexamethasone and 3 mL if 2% MPF lidocaine was injected x 0.5 mL.  This same procedure was performed on the right side using the same needle, technique and injectate.  Patient tolerated procedure well.  Post procedure instructions were given.  Pre injection pain 7/10 Post injection pain 0/10

## 2012-07-01 NOTE — Progress Notes (Signed)
  PROCEDURE RECORD [facility]   Name: ANTHEA UDOVICH DOB:07-27-1976 MRN: [id number]  Date:07/01/2012  Physician: Claudette Laws, MD    Nurse/CMA: Levens/Shumaker RN  Allergies:  Allergies  Allergen Reactions  . Taxol (Paclitaxel)   . Tramadol   . Ultram (Tramadol Hcl)     Consent Signed: yes  Is patient diabetic? no    Pregnant: no LMP: No LMP recorded. Patient has had a hysterectomy. (age [age])  Anticoagulants: no Anti-inflammatory: yes (ibuprofen) Antibiotics: no  Procedure: Bilateral Medial Branch Blocks  Position: Prone Start Time:  2:38 End Time:  2:47 Fluoro Time: 27 sec  RN/CMA Levens, CMA ShumakerRN    Time 1:51 pm 2:50    BP 140/84 166/81    Pulse 94 89    Respirations 14 14    O2 Sat 97 97    S/S 6 6    Pain Level 8/10 0/10     D/C home with Dad-Joseph, patient A & O X 3, D/C instructions reviewed, and sits independently.

## 2012-07-01 NOTE — Patient Instructions (Signed)
Facet Block A facet block is an injection procedure used to numb nerves near a spinal joint (facet). The injection usually includes a medicine like Novacaine (anesthetic) and a steroid medicine (similar to cortisone). The injections are made directly into the facet joint of the back. They are used for patients with several types of neck or back pain problems (such as worsening arthritis or persistent pain after surgery) that have not been helped with anti-inflammatory medications, exercise programs, physical therapy, and other forms of pain management. Multiple injections may be needed depending on how many joints are involved.  A facet block procedure can be helpful with diagnosis as well as providing therapeutic pain relief. One of three things may happen after the procedure:  The pain does not go away. This can mean that the pain is probably not coming from blocked facet joints. This information is helpful with diagnosis.   The pain goes away and stays away for a few hours but the original pain comes back and does not get better again. This information is also helpful with diagnosis. It can mean that pain is probably coming from the joints; but the steroid was not helpful for longer term pain control.   The pain goes away after the block, then returns later that day, and then gets better again over the next few days. This can mean that the block was helpful for pain control and the steroid had a longer lasting effect.  If there is good, lasting benefit from the injections, the block may be repeated from 3 to 5 times. If there is good relief but it is only of short-term benefit, other procedures (such as radiofrequency lesioning) may be considered.  Note: The procedure cannot be performed if you have an active infection, a lesion on or near the area of injection, flu, cold, fever, very high blood pressure or if you are on blood thinners. Please make your doctor aware of any of these conditions. This is  for your safety!  LET YOUR CAREGIVER KNOW ABOUT:   Allergies.   Medications taken including herbs, eye drops, over the counter medications, and creams   Use of steroids (by mouth or creams).   Possible pregnancy, if applicable.   Previous problems with anesthetics or Novocaine.   History of blood clots.   History of bleeding or blood problems.   Previous surgery, particularly of the neck and/or back   Other health problems.  RISKS AND COMPLICATIONS These are very uncommon but include:  Bleeding.   Injury to a nerve near the injection site.   Weakness or numbness in areas controlled by nerves near the injection site.   Infection.   Pain at the site of the injection.   Temporary fluid retention in those who are prone to this problem.   Allergic reaction to anesthetics or medicines used during the procedure.  Diabetics may have a temporary increase in their blood sugar after any surgical procedure, especially if steroids are used. Stinging/burning of the numbing medicine is the most uncomfortable part of the procedure; however every person's response to any procedure is individual.  BEFORE THE PROCEDURE   Your caregiver will provide instructions about stopping any medication before the procedure.   Unless advised otherwise, if the injections are in your neck, you may take your medications as usual with a sip of water but do not eat or drink for 6 hours before the procedure.   Unless advised otherwise, you may eat, drink and take your medications as   usual on the day of the procedure (both before and after) if the injections are to be in your lower back.   There is no other specific preparation necessary unless advised otherwise.  PROCEDURE After checking your blood pressure, the procedure will be done in the x-ray (fluoroscopy) room while lying on your stomach. For procedures in the neck, an intravenous line is usually started. The back is then cleansed with an antiseptic  soap. Sterile drapes are placed in this area. The skin is numbed with a local anesthetic. This is felt as a stinging or burning sensation. Using x-ray guidance, needles are then advanced to the appropriate locations. Once the needles are in the proper location, the anesthetic and steroid is injected through the needles and the needles are removed. The skin is then cleansed and bandages are applied. Blood pressure will be checked again, and you will be discharged to leave with your ride after your caregiver says it is okay to go.  AFTER THE PROCEDURE  You may not drive for the remainder of the day after your procedure. An adult must be present to drive you home or to go with you in a taxi or on public transportation. The procedure will be canceled if you do not have a responsible adult with you! This is for your safety.  HOME CARE INSTRUCTIONS   The bandages noted above can be removed on the morning after the procedure.   Resume medications according to your caregiver's instructions.   No heat is to be used near or over the injected area(s) for the remainder of the day.   No tub bath or soaking in water (such as a pool, jacuzzi, etc.) for the remainder of the day.   Some local tenderness may be experienced for a couple of days after the injection. Using an ice pack three or four times a day will help this.   Keep track of the amount of pain relief as well as how long the pain relief lasted.  SEEK MEDICAL CARE IF:   There is drainage from the injection site.   Pain is not controlled with medications prescribed.   There is significant bleeding or swelling.  SEEK IMMEDIATE MEDICAL CARE IF:   You develop a fever of 101 F (38.3 C) or greater.   Worsening pain, swelling, and/or red streaking develops in the skin around the injection site.   Severe pain develops and cannot be controlled with medications prescribed.   You develop any headache, stiff neck, nausea, vomiting, or your eyes become  very sensitive to light.   Weakness or paralysis develops in arms or legs not present before the procedure.   You develop difficulty urinating or difficulty breathing.  Document Released: 04/08/2007 Document Revised: 11/06/2011 Document Reviewed: 03/29/2009 [employer] Patient Information 2012 [employer]. 

## 2012-07-05 ENCOUNTER — Telehealth: Payer: Self-pay | Admitting: Family Medicine

## 2012-07-05 ENCOUNTER — Telehealth: Payer: Self-pay | Admitting: *Deleted

## 2012-07-05 MED ORDER — FLUTICASONE PROPIONATE 50 MCG/ACT NA SUSP: 2.0000 | Freq: Every day | NASAL | Status: DC

## 2012-07-05 NOTE — Telephone Encounter (Signed)
Please notify patient Rx for Flonase (nasal spray) has been called in to [facility].  Her insurance no longer covers Nasonex, however, Flonase should work just as well.

## 2012-07-05 NOTE — Telephone Encounter (Signed)
PA required for Nasonex . Form placed in Dr. Bluford [place]'s box.

## 2012-07-06 NOTE — Telephone Encounter (Signed)
Rx for Flonase sent. 

## 2012-07-12 ENCOUNTER — Ambulatory Visit: Payer: Medicaid Other | Admitting: Physical Medicine & Rehabilitation

## 2012-07-14 ENCOUNTER — Telehealth: Payer: Self-pay | Admitting: *Deleted

## 2012-07-14 NOTE — Telephone Encounter (Signed)
per md going on vac left voice message to inform the patient of the new date and time on 07-26-2012

## 2012-07-14 NOTE — Telephone Encounter (Signed)
Patient called and confirmed over the new date and time on 07-27-2012 starting at 1:00pm

## 2012-07-15 ENCOUNTER — Ambulatory Visit: Payer: Medicaid Other | Admitting: Oncology

## 2012-07-15 ENCOUNTER — Other Ambulatory Visit: Payer: Medicaid Other | Admitting: Lab

## 2012-07-19 ENCOUNTER — Ambulatory Visit (INDEPENDENT_AMBULATORY_CARE_PROVIDER_SITE_OTHER): Payer: Medicaid Other | Admitting: Family Medicine

## 2012-07-19 ENCOUNTER — Encounter: Payer: Self-pay | Admitting: Family Medicine

## 2012-07-19 VITALS — BP 140/87 | HR 105 | Temp 98.2°F | Ht 64.0 in | Wt 249.0 lb

## 2012-07-19 DIAGNOSIS — J069 Acute upper respiratory infection, unspecified: Secondary | ICD-10-CM

## 2012-07-19 DIAGNOSIS — J029 Acute pharyngitis, unspecified: Secondary | ICD-10-CM

## 2012-07-19 LAB — POCT RAPID STREP A (OFFICE): Rapid Strep A Screen: NEGATIVE

## 2012-07-19 MED ORDER — AMOXICILLIN 500 MG PO CAPS: 500.0000 mg | ORAL_CAPSULE | Freq: Two times a day (BID) | ORAL | Status: DC

## 2012-07-19 MED ORDER — AMOXICILLIN-POT CLAVULANATE 875-125 MG PO TABS: 1.0000 | ORAL_TABLET | Freq: Two times a day (BID) | ORAL | Status: DC

## 2012-07-19 NOTE — Assessment & Plan Note (Signed)
Plan to treat due to severity of symptoms and no improvement as well as fact she often has seizures triggered by infections due to low seizure threshold.  Will prescribe Augmentin x 7 days as she states this works for her. Instructed patient to return in 1 week for checkup or sooner if worsening or no improvement.

## 2012-07-19 NOTE — Progress Notes (Signed)
  Subjective:    Patient ID: Madison [name], female    DOB: February 26, 1976, 36 y.o.   MRN: [id number]  HPI  1.  URI symptoms:  Present x 2 days.  Described nasal and sinus congestion.  Sore throat x 2 days.  Has postnasal drainage, worse at night.  No ear pain or changes in hearing.  Temp to 100 measured at home.  No nausea, vomiting, abdominal pain.  History of epilepsy, frequent seizures when she has infections.  Also painful glands under jaw.    Review of Systems See HPI above for review of systems.       Objective:   Physical Exam  BP 140/87  Pulse 105  Temp 98.2 F (36.8 C) (Oral)  Ht 5\' 4"  (1.626 m)  Wt 249 lb (112.946 kg)  BMI 42.74 kg/m2 Gen:  Patient sitting on exam table, appears stated age in no acute distress Head: Normocephalic atraumatic Eyes: EOMI, PERRL, sclera and conjunctiva non-erythematous Nose:  Nasal turbinates grossly enlarged bilaterally. Some exudates noted. Tender to palpation of maxillary sinus  Ears:  TM's and ear canals clear BL without erythema. Mouth: Mucosa membranes moist. Tonsils +3 with exudates noted both tonsils and oropharynx.  Neck: No cervical lymphadenopathy noted (obese neck, difficult to palpate). Heart:  RRR, no murmurs auscultated. Pulm:  Clear to auscultation bilaterally with good air movement.  No wheezes or rales noted.          Assessment & Plan:

## 2012-07-19 NOTE — Patient Instructions (Addendum)
Your strep test is negative. Try the Amoxicillin twice daily for the next 7 days. For your lips, my one recommendation would be to stop using the petroleum jelly based products for several days, and then restart with products without that base.    It was good to meet you!

## 2012-07-26 ENCOUNTER — Ambulatory Visit (INDEPENDENT_AMBULATORY_CARE_PROVIDER_SITE_OTHER): Payer: Medicaid Other | Admitting: Family Medicine

## 2012-07-26 ENCOUNTER — Telehealth: Payer: Self-pay

## 2012-07-26 ENCOUNTER — Ambulatory Visit: Payer: Medicaid Other | Admitting: Oncology

## 2012-07-26 ENCOUNTER — Other Ambulatory Visit: Payer: Medicaid Other | Admitting: Lab

## 2012-07-26 ENCOUNTER — Encounter: Payer: Self-pay | Admitting: Family Medicine

## 2012-07-26 VITALS — BP 164/85 | HR 105 | Temp 98.0°F | Ht 64.5 in | Wt 242.0 lb

## 2012-07-26 DIAGNOSIS — B370 Candidal stomatitis: Secondary | ICD-10-CM

## 2012-07-26 DIAGNOSIS — K143 Hypertrophy of tongue papillae: Secondary | ICD-10-CM

## 2012-07-26 DIAGNOSIS — J069 Acute upper respiratory infection, unspecified: Secondary | ICD-10-CM

## 2012-07-26 MED ORDER — NYSTATIN 100000 UNIT/ML MT SUSP: 500000.0000 [IU] | Freq: Four times a day (QID) | OROMUCOSAL | Status: DC

## 2012-07-26 MED ORDER — LEVOFLOXACIN 500 MG PO TABS: 500.0000 mg | ORAL_TABLET | Freq: Every day | ORAL | Status: AC

## 2012-07-26 NOTE — Telephone Encounter (Signed)
I can't think of anything that would be contraindicated.  She is more a danger of having a seizure from her sickness than any seizure from medications.

## 2012-07-26 NOTE — Patient Instructions (Addendum)
Use the Levaquin (antibiotic) once a day for 7 days.  Use the Mucinex as directed on the box.    Use the Nystatin swish and swallow up to 4 times a day.    It was good to see, I hope you feel better!

## 2012-07-26 NOTE — Telephone Encounter (Signed)
Pt has been sick for the past couple of weeks, has been on Augmentin for 10 days, but it has not helped any.  She has been using her Neti pot, but it has not helped either and usually it does.  She goes back to her primary care today, but per pt they are scared to give her anything because they don't want to cause a seizure.  She would like to know what is safe for her to take, even otc for the drainage.  She does know that you are out of the country until next week.

## 2012-07-26 NOTE — Telephone Encounter (Signed)
Pt aware, she said they put her on another antibx for pneumonia today.

## 2012-07-27 ENCOUNTER — Other Ambulatory Visit (HOSPITAL_BASED_OUTPATIENT_CLINIC_OR_DEPARTMENT_OTHER): Payer: Medicaid Other | Admitting: Lab

## 2012-07-27 ENCOUNTER — Ambulatory Visit: Payer: Medicaid Other

## 2012-07-27 ENCOUNTER — Ambulatory Visit (HOSPITAL_BASED_OUTPATIENT_CLINIC_OR_DEPARTMENT_OTHER): Payer: Medicaid Other | Admitting: Oncology

## 2012-07-27 ENCOUNTER — Other Ambulatory Visit: Payer: Self-pay | Admitting: Oncology

## 2012-07-27 VITALS — BP 154/100 | HR 90 | Temp 98.4°F | Resp 20 | Ht 64.5 in | Wt 242.3 lb

## 2012-07-27 DIAGNOSIS — IMO0001 Reserved for inherently not codable concepts without codable children: Secondary | ICD-10-CM

## 2012-07-27 DIAGNOSIS — C50919 Malignant neoplasm of unspecified site of unspecified female breast: Secondary | ICD-10-CM

## 2012-07-27 DIAGNOSIS — G40909 Epilepsy, unspecified, not intractable, without status epilepticus: Secondary | ICD-10-CM

## 2012-07-27 DIAGNOSIS — Z170 Estrogen receptor positive status [ER+]: Secondary | ICD-10-CM

## 2012-07-27 DIAGNOSIS — C50019 Malignant neoplasm of nipple and areola, unspecified female breast: Secondary | ICD-10-CM

## 2012-07-27 LAB — CBC WITH DIFFERENTIAL/PLATELET
BASO%: 1 % (ref 0.0–2.0)
EOS%: 2.4 % (ref 0.0–7.0)
MCH: 28.8 pg (ref 25.1–34.0)
MCHC: 33.1 g/dL (ref 31.5–36.0)
MONO#: 0.8 10*3/uL (ref 0.1–0.9)
RBC: 4.7 10*6/uL (ref 3.70–5.45)
WBC: 10.4 10*3/uL — ABNORMAL HIGH (ref 3.9–10.3)
lymph#: 2.5 10*3/uL (ref 0.9–3.3)

## 2012-07-27 LAB — COMPREHENSIVE METABOLIC PANEL (CC13)
AST: 23 U/L (ref 5–34)
Albumin: 3.8 g/dL (ref 3.5–5.0)
Alkaline Phosphatase: 126 U/L (ref 40–150)
BUN: 8 mg/dL (ref 7.0–26.0)
Potassium: 3.6 mEq/L (ref 3.5–5.1)
Sodium: 139 mEq/L (ref 136–145)
Total Bilirubin: 0.3 mg/dL (ref 0.20–1.20)

## 2012-07-27 MED ORDER — HEPARIN SOD (PORK) LOCK FLUSH 100 UNIT/ML IV SOLN
500.0000 [IU] | Freq: Once | INTRAVENOUS | Status: AC
  Administered 2012-07-27: 500 [IU] via INTRAVENOUS
  Filled 2012-07-27: qty 5

## 2012-07-27 MED ORDER — SODIUM CHLORIDE 0.9 % IJ SOLN
10.0000 mL | INTRAMUSCULAR | Status: DC | PRN
  Administered 2012-07-27: 10 mL via INTRAVENOUS
  Filled 2012-07-27: qty 10

## 2012-07-27 NOTE — Progress Notes (Signed)
Hematology and Oncology Follow Up Visit  Madison [name] [id number] 1976/01/03 36 y.o. 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 and weekly Taxol on protocol ECoG-03/31/2002, status post right prophylactic mastectomy, laparoscopic oophrectomy currently on letrozole. History of seizures History of chronic pain and possible fibromyalgia History of hypertension  Interim History:  The patient returns for followup. She is currently living with her mother. She is still having seizures last being a few weeks ago. She's been followed by neurology. She is on multiple medications. Seizures appear to be coming in clusters with no specific etiology described . She is getting more the spine and the period her life is fairly limited. She cannot drive. Her children live with her. She appears to be tolerating her medications especially letrozole fairly well. Medications: I have reviewed the patient's current medications.  Allergies:  Allergies  Allergen Reactions  . Taxol (Paclitaxel)   . Tramadol   . Ultram (Tramadol Hcl)     Past Medical History, Surgical history, Social history, and Family History were reviewed and updated.  Review of Systems: Constitutional:  Negative for fever, chills, night sweats, anorexia, weight loss, pain. Cardiovascular: no chest pain or dyspnea on exertion Respiratory: no cough, shortness of breath, or wheezing Neurological: no TIA or stroke symptoms, no recent auras Dermatological: negative ENT: negative Skin Gastrointestinal: no abdominal pain, change in bowel habits, or black or bloody stools Genito-Urinary: no dysuria, trouble voiding, or hematuria, being treated for uti Hematological and Lymphatic: negative Breast: negative for breast lumps negative Musculoskeletal: negative Remaining ROS negative.  Physical  Exam: Blood pressure 154/100, pulse 90, temperature 98.4 F (36.9 C), temperature source Oral, resp. rate 20, height 5' 4.5" (1.638 m), weight 242 lb 4.8 oz (109.907 kg). ECOG: 0 General appearance: appears stated age, fatigued and mild distress Head: Normocephalic, without obvious abnormality, atraumatic Neck: no adenopathy, no carotid bruit, no JVD, supple, symmetrical, trachea midline and thyroid not enlarged, symmetric, no tenderness/mass/nodules Lymph nodes: Cervical, supraclavicular, and axillary nodes normal. Cardiac : Normal heart Pulmonary: Normal breath sounds Breasts: Status post bilateral mastectomy without evidence of local recurrence, tender lt chest wall, w/out evidence of recurrence. Abdomen: Normal Extremities normal Neuro: Normal  Lab Results: Lab Results  Component Value Date   WBC 10.4* 07/27/2012   HGB 13.5 07/27/2012   HCT 40.8 07/27/2012   MCV 86.8 07/27/2012   PLT 267 07/27/2012     Chemistry      Component Value Date/Time   NA 136 03/29/2012 1525   K 3.4* 03/29/2012 1525   CL 102 03/29/2012 1525   CO2 27 03/29/2012 1525   BUN 11 03/29/2012 1525   CREATININE 0.70 03/29/2012 1525   CREATININE 0.83 01/15/2012 1331      Component Value Date/Time   CALCIUM 9.7 03/29/2012 1525   ALKPHOS 108 03/29/2012 1525   AST 25 03/29/2012 1525   ALT 22 03/29/2012 1525   BILITOT 0.4 03/29/2012 1525       Radiological Studies: chest X-ray n/a Mammogram n/a Bone density n/a  Impression and Plan: Pleasant but unfortunate young woman with history of breast cancer and associated other medical issues including hypertension, seizure disorder and fibromyalgia. She continues to have problems with her seizures and is the spine and despite that overall from the physical point of view she seems to be doing fairly well. I will see her in 6 months time.  She'll continue letrozole. I discussed moving on with her life and getting onto exercise programs etc. Her labs are stable and i will  see her in 6 months.  More than 50% of the visit was spent in patient-related counselling   Pierce Crane, MD 8/27/20133:22 PM

## 2012-07-28 DIAGNOSIS — B370 Candidal stomatitis: Secondary | ICD-10-CM | POA: Insufficient documentation

## 2012-07-28 DIAGNOSIS — K143 Hypertrophy of tongue papillae: Secondary | ICD-10-CM | POA: Insufficient documentation

## 2012-07-28 LAB — VITAMIN D 25 HYDROXY (VIT D DEFICIENCY, FRACTURES): Vit D, 25-Hydroxy: 33 ng/mL (ref 30–89)

## 2012-07-28 NOTE — Assessment & Plan Note (Signed)
Nystatin swish and swallow for relief.

## 2012-07-28 NOTE — Progress Notes (Signed)
  Subjective:    Patient ID: Madison [name], female    DOB: [birth year], 36 y.o.   MRN: [id number]  HPI  1.  URI:  Not improved.  Patient still with considerable nasal/sinus congestion.  Now with worsening cough.  Productive of thick, greenish yellow sputum.  Throat still somewhat sore, not as bad as prior.  Has taken full course of Augmentin.  Has not tried anything OTC b/c of fear it will trigger seizures.  No fevers, some chills at night.    Review of Systems See HPI above for review of systems.       Objective:   Physical Exam BP 164/85  Pulse 105  Temp 98 F (36.7 C) (Oral)  Ht 5' 4.5" (1.638 m)  Wt 242 lb (109.77 kg)  BMI 40.90 kg/m2 Gen:  Patient sitting on exam table, appears stated age in no acute distress Head: Normocephalic atraumatic Eyes: EOMI, PERRL, sclera and conjunctiva non-erythematous.  Pupils somewhat dilated at 4 mm BL. Nose:  Nasal turbinates grossly enlarged bilaterally. Some exudates noted. Tender to palpation of maxillary sinus  Mouth: Mucosa membranes moist. Tonsils +2, nonenlarged, non-erythematous.  Brownish discoloration to tongue, thick white film noted on posterior of tongue and oropharynx.  Easily scraped off.   Neck: No cervical lymphadenopathy noted Heart:  RRR, no murmurs auscultated. Pulm:  Clear to auscultation bilaterally with good air movement.  No wheezes or rales noted.  Pronounced coughing episodes during inspiration.            Assessment & Plan:

## 2012-07-28 NOTE — Assessment & Plan Note (Addendum)
Plan to switch to Levaquin due to no improvement with Augmentin.  Concern is to prevent any episodes of seizures -- I do not know her well but note in records and per her own report that infections seem to trigger seizure activity.  She does have seizure termination meds at home.   No red flags on history or physical today.  To FU in 1 week for improvement.

## 2012-07-28 NOTE — Assessment & Plan Note (Signed)
Believe to be secondary to both antibiotic use as well as current oral candidal infection. To continue good oral hygiene.

## 2012-07-30 ENCOUNTER — Other Ambulatory Visit: Payer: Self-pay | Admitting: Family Medicine

## 2012-07-30 ENCOUNTER — Encounter: Payer: Self-pay | Admitting: Physical Medicine & Rehabilitation

## 2012-07-30 ENCOUNTER — Ambulatory Visit (HOSPITAL_BASED_OUTPATIENT_CLINIC_OR_DEPARTMENT_OTHER): Payer: Medicaid Other | Admitting: Physical Medicine & Rehabilitation

## 2012-07-30 VITALS — BP 163/87 | HR 95 | Resp 16 | Ht 64.5 in | Wt 245.0 lb

## 2012-07-30 DIAGNOSIS — M47817 Spondylosis without myelopathy or radiculopathy, lumbosacral region: Secondary | ICD-10-CM

## 2012-07-30 DIAGNOSIS — M76899 Other specified enthesopathies of unspecified lower limb, excluding foot: Secondary | ICD-10-CM

## 2012-07-30 NOTE — Patient Instructions (Signed)
I did a right trochanteric bursa injection today Please get a theraband which is a large rubber band which you can wrap around a doorknob and do the exercises I described Your next visit will be with Madison [name] and she can review your exercises with you If your back starts bothering you again please call me and we can repeat your back injection No changes to your medications are recommended at this time

## 2012-07-30 NOTE — Progress Notes (Signed)
  Subjective:    Patient ID: Madison [name], female    DOB: 07/02/1976, 36 y.o.   MRN: [id number]  HPI Pain Inventory Average Pain 8 Pain Right Now 8 My pain is constant, burning, dull and aching  In the last 24 hours, has pain interfered with the following? General activity 5 Relation with others 5 Enjoyment of life 5 What TIME of day is your pain at its worst? All Day Sleep (in general) Fair  Pain is worse with: walking, sitting and standing Pain improves with: medication Relief from Meds: 3  Mobility walk without assistance ability to climb steps?  yes do you drive?  no  Function I need assistance with the following:  meal prep, household duties and shopping  Neuro/Psych weakness numbness trouble walking  Prior Studies Any changes since last visit?  no  Physicians involved in your care Any changes since last visit?  no   History reviewed. No pertinent family history. History   Social History  . Marital Status: Legally Separated    Spouse Name: N/A    Number of Children: N/A  . Years of Education: N/A   Social History Main Topics  . Smoking status: Current Everyday Smoker -- 1.0 packs/day    Types: Cigarettes  . Smokeless tobacco: Never Used   Comment: 1 ppd for 20 years   . Alcohol Use: Yes     minimal   . Drug Use: None  . Sexually Active: None   Other Topics Concern  . None   Social History Narrative   Living w parents in [place] due to cancer and inability to take car for herself or others. Has 3 children. 91 yo has bipolar and MR, currently in foster home. Other 2 live with her. Divorced.    Past Surgical History  Procedure Date  . Appendectomy   . Tonsillectomy   . Tubal ligation   . Mastectomy     bilateral  . Cholecystectomy March 2011  . Abdominal hysterectomy March 2012  . Breast surgery     bilateral mastectomy  . Port-a-cath insertion    Past Medical History  Diagnosis Date  . Tobacco user   . Grand mal seizure   .  Fibromyalgia   . Seizures   . Breast CA    BP 163/87  Pulse 95  Resp 16  Ht 5' 4.5" (1.638 m)  Wt 245 lb (111.131 kg)  BMI 41.40 kg/m2  SpO2 98%      Review of Systems  HENT: Negative.   Eyes: Negative.   Respiratory: Negative.   Cardiovascular: Negative.   Genitourinary: Negative.   Musculoskeletal: Positive for back pain and gait problem.  Neurological: Positive for weakness and numbness.  Hematological: Negative.   Psychiatric/Behavioral: Negative.        Objective:   Physical Exam        Assessment & Plan:

## 2012-07-30 NOTE — Progress Notes (Signed)
  Subjective:    Patient ID: Madison [name], female    DOB: [birth year], 36 y.o.   MRN: [id number]  HPI  Good results from bilateral L3-L4 medial branch blocks and L5 dorsal ramus injection performed on 07/01/2012. Has some mid back pain between the shoulder blades. Also has some recurrence of right hip pain.  Review of Systems     Objective:   Physical Exam  Constitutional: She is oriented to person, place, and time. She appears well-developed.       obese  Neck: Normal range of motion.  Musculoskeletal:       Right hip: She exhibits tenderness.       Left hip: She exhibits tenderness.       Thoracic back: She exhibits tenderness.       Lumbar back: Normal.       Tenderness in the thoracic paraspinal muscles. Tenderness in the right greater than left trochanteric bursa area  Neurological: She is alert and oriented to person, place, and time.  Psychiatric: She has a normal mood and affect.          Assessment & Plan:  1. Lumbar spondylosis improved with medial branch blocks. As discussed with patient difficult to predict the duration of response. We can repeat this as needed. 2. Right trochanteric bursitis recurrent we'll reinject today 3. Parascapular pain thoracic pain. This is likely myofascial in should respond best to exercises. She is doing some exercises on her own I have asked her to perform theraband exercises. She can followup with my PA Clydie Braun to review in a couple weeks. No change in medication recommended  Right hip trochanteric bursa injection Indication right trochanteric bursitis already on multiple pain medications Informed consent was obtained after describing risk and benefits of the procedure with the patient these include bleeding bruising and infection she elects to proceed and has given written consent. Patient placed in a left lateral decubitus position right hip palpated marked and prepped with Betadine alcohol and with a 21-gauge 2 inch needle inserted  to bone contact injected 1 cc of 40 November cc Depo-Medrol and 4 cc 1% lidocaine. Patient tolerated procedure well. Post procedure instructions given

## 2012-08-06 ENCOUNTER — Ambulatory Visit (INDEPENDENT_AMBULATORY_CARE_PROVIDER_SITE_OTHER): Payer: Medicaid Other | Admitting: Family Medicine

## 2012-08-06 ENCOUNTER — Telehealth: Payer: Self-pay | Admitting: *Deleted

## 2012-08-06 ENCOUNTER — Encounter: Payer: Self-pay | Admitting: Family Medicine

## 2012-08-06 VITALS — BP 139/94 | HR 101 | Temp 98.4°F | Ht 64.5 in | Wt 238.8 lb

## 2012-08-06 DIAGNOSIS — I159 Secondary hypertension, unspecified: Secondary | ICD-10-CM

## 2012-08-06 DIAGNOSIS — F329 Major depressive disorder, single episode, unspecified: Secondary | ICD-10-CM

## 2012-08-06 DIAGNOSIS — G8929 Other chronic pain: Secondary | ICD-10-CM

## 2012-08-06 DIAGNOSIS — R569 Unspecified convulsions: Secondary | ICD-10-CM

## 2012-08-06 DIAGNOSIS — I158 Other secondary hypertension: Secondary | ICD-10-CM

## 2012-08-06 DIAGNOSIS — E669 Obesity, unspecified: Secondary | ICD-10-CM

## 2012-08-06 MED ORDER — MOMETASONE FUROATE 50 MCG/ACT NA SUSP: 2.0000 | Freq: Every day | NASAL | Status: DC

## 2012-08-06 NOTE — Progress Notes (Signed)
  Subjective:    Patient ID: Madison [name], female    DOB: [birth year], 36 y.o.   MRN: [id number]  HPI  Madison [name] comes in for follow up.  She is doing somewhat better.    Pain- is seeing Pain clinic, and they have helped her a lot.  She has gotten injection therapy, which is making a difference.  She says that Dr. Wynn Banker told her she defiantly has fibromyalgia.  She is very glad she is getting treatment there. She does take an occasional vicodin, which Dr. Wynn Banker is OK with.    BP- taking medications daily.  Has been checking at home, Blood pressure is usually 115/80-140/85.  She denies chest pain, palpitations, dizziness, dyspnea, LE edema.   Seizures- has had a few seizures recently, and Dr. Modesto Charon has increased some of her antiepileptic medications. She says that Dr. Modesto Charon is leaving [place] and is going to [place], so she is going to follow him and have to start going to [facility] again for her neurology care.    Depression- feels better mood wise.  Is still crying some days, but much better from before, which she feels like is from the elavil.    Obesity- has gained 30 lbs since starting elavil.  She is frustrated with this, but says she would rather be happy and fat than skinny and sad.  She has been able to increase her activity- is now doing some walking, but says she feels like she has to rest often.    Review of Systems See HPI    Objective:   Physical Exam BP 139/94  Pulse 101  Temp 98.4 F (36.9 C) (Oral)  Ht 5' 4.5" (1.638 m)  Wt 238 lb 12.8 oz (108.319 kg)  BMI 40.36 kg/m2 General appearance: alert, cooperative and no distress Lungs: clear to auscultation bilaterally Heart: regular rate and rhythm, S1, S2 normal, no murmur, click, rub or gallop Extremities: extremities normal, atraumatic, no cyanosis or edema Pulses: 2+ and symmetric       Assessment & Plan:

## 2012-08-06 NOTE — Patient Instructions (Signed)
It was good to see you.  I am glad you are feeling a little better.  Your blood pressure today was BP: 139/94 mmHg.  Remember your goal blood pressure is about 130/80.  Please be sure to take your medication every day.    The antibiotic you recently took was called Levofloxacin, or Levaquin.    Please make an appointment to follow up in about 3 months.

## 2012-08-06 NOTE — Telephone Encounter (Signed)
PA required for Nasonex. Form given to MD.

## 2012-08-06 NOTE — Telephone Encounter (Signed)
PA completed online and approval received. Pharmacy notified.

## 2012-08-08 NOTE — Assessment & Plan Note (Signed)
Improved control from recent visits.  She is taking medications as prescribed.  Will have her continue to check at home, continue current medications.

## 2012-08-08 NOTE — Assessment & Plan Note (Signed)
Some improvement with pain management's helps, both the patient and I greatly appreciate their treatment.  At this time, she will continue current medications and following with them.  She does use an occasional vicodin as needed.

## 2012-08-08 NOTE — Assessment & Plan Note (Signed)
She has lost 10 lbs - feel that her improved pain is allowing her to be more active.  I feel that she is beginning to head in the right direction as far as pain, activity, and weight management.  Will continue to support and encourage her in her weight loss efforts.

## 2012-08-08 NOTE — Assessment & Plan Note (Signed)
On Elavil and Cymbalta for dual effects with chronic pain.  She notes some improvement- and clinically she appears much happier today.  Although Elavil may have contributed to her recent weight gain, I do agree with her that she will be better overall if her mood is better.  Continue current medications.

## 2012-08-08 NOTE — Assessment & Plan Note (Signed)
Zonisamide has been increased per Dr. Modesto Charon due to poor seizure control.  Per patient, Dr. Modesto Charon is moving to [facility], but she will follow him there- she will sign release of info forms so her multiple providers can continue to communicate.

## 2012-08-16 ENCOUNTER — Ambulatory Visit: Payer: Medicaid Other | Admitting: Physical Medicine and Rehabilitation

## 2012-08-17 ENCOUNTER — Encounter
Payer: Medicaid Other | Attending: Physical Medicine and Rehabilitation | Admitting: Physical Medicine and Rehabilitation

## 2012-09-08 ENCOUNTER — Telehealth: Payer: Self-pay | Admitting: *Deleted

## 2012-09-08 DIAGNOSIS — M797 Fibromyalgia: Secondary | ICD-10-CM | POA: Insufficient documentation

## 2012-09-08 DIAGNOSIS — R278 Other lack of coordination: Secondary | ICD-10-CM | POA: Insufficient documentation

## 2012-09-08 NOTE — Telephone Encounter (Signed)
Needs another MBB.  Schedule? °

## 2012-09-08 NOTE — Telephone Encounter (Signed)
Yes she can be scheduled. Per Dr. Wynn Banker last note, if her back starting bothering her again we could repeat her injection.

## 2012-09-09 ENCOUNTER — Ambulatory Visit (HOSPITAL_BASED_OUTPATIENT_CLINIC_OR_DEPARTMENT_OTHER): Payer: Medicaid Other

## 2012-09-09 VITALS — BP 135/69 | HR 95 | Temp 98.1°F

## 2012-09-09 DIAGNOSIS — C50019 Malignant neoplasm of nipple and areola, unspecified female breast: Secondary | ICD-10-CM

## 2012-09-09 DIAGNOSIS — C50919 Malignant neoplasm of unspecified site of unspecified female breast: Secondary | ICD-10-CM

## 2012-09-09 DIAGNOSIS — Z452 Encounter for adjustment and management of vascular access device: Secondary | ICD-10-CM

## 2012-09-09 MED ORDER — SODIUM CHLORIDE 0.9 % IJ SOLN
10.0000 mL | INTRAMUSCULAR | Status: DC | PRN
  Administered 2012-09-09: 10 mL via INTRAVENOUS
  Filled 2012-09-09: qty 10

## 2012-09-09 MED ORDER — HEPARIN SOD (PORK) LOCK FLUSH 100 UNIT/ML IV SOLN
500.0000 [IU] | Freq: Once | INTRAVENOUS | Status: AC
  Administered 2012-09-09: 500 [IU] via INTRAVENOUS
  Filled 2012-09-09: qty 5

## 2012-09-09 NOTE — Telephone Encounter (Signed)
done

## 2012-09-09 NOTE — Patient Instructions (Signed)
Call MD with any questions 

## 2012-09-27 ENCOUNTER — Ambulatory Visit (HOSPITAL_BASED_OUTPATIENT_CLINIC_OR_DEPARTMENT_OTHER): Payer: Medicaid Other | Admitting: Physical Medicine & Rehabilitation

## 2012-09-27 ENCOUNTER — Encounter: Payer: Self-pay | Admitting: Physical Medicine & Rehabilitation

## 2012-09-27 ENCOUNTER — Encounter: Payer: Medicaid Other | Attending: Physical Medicine & Rehabilitation

## 2012-09-27 VITALS — BP 133/59 | HR 105 | Resp 14 | Ht 64.5 in | Wt 246.6 lb

## 2012-09-27 DIAGNOSIS — IMO0001 Reserved for inherently not codable concepts without codable children: Secondary | ICD-10-CM | POA: Insufficient documentation

## 2012-09-27 DIAGNOSIS — M47817 Spondylosis without myelopathy or radiculopathy, lumbosacral region: Secondary | ICD-10-CM

## 2012-09-27 DIAGNOSIS — M25559 Pain in unspecified hip: Secondary | ICD-10-CM | POA: Insufficient documentation

## 2012-09-27 DIAGNOSIS — M76899 Other specified enthesopathies of unspecified lower limb, excluding foot: Secondary | ICD-10-CM | POA: Insufficient documentation

## 2012-09-27 DIAGNOSIS — Z79899 Other long term (current) drug therapy: Secondary | ICD-10-CM | POA: Insufficient documentation

## 2012-09-27 DIAGNOSIS — G894 Chronic pain syndrome: Secondary | ICD-10-CM | POA: Insufficient documentation

## 2012-09-27 DIAGNOSIS — M79609 Pain in unspecified limb: Secondary | ICD-10-CM | POA: Insufficient documentation

## 2012-09-27 DIAGNOSIS — T451X5A Adverse effect of antineoplastic and immunosuppressive drugs, initial encounter: Secondary | ICD-10-CM | POA: Insufficient documentation

## 2012-09-27 DIAGNOSIS — M25569 Pain in unspecified knee: Secondary | ICD-10-CM | POA: Insufficient documentation

## 2012-09-27 DIAGNOSIS — M549 Dorsalgia, unspecified: Secondary | ICD-10-CM | POA: Insufficient documentation

## 2012-09-27 DIAGNOSIS — R209 Unspecified disturbances of skin sensation: Secondary | ICD-10-CM | POA: Insufficient documentation

## 2012-09-27 DIAGNOSIS — M542 Cervicalgia: Secondary | ICD-10-CM | POA: Insufficient documentation

## 2012-09-27 DIAGNOSIS — G620 Drug-induced polyneuropathy: Secondary | ICD-10-CM | POA: Insufficient documentation

## 2012-09-27 NOTE — Progress Notes (Signed)
  Subjective:    Patient ID: Madison [name], female    DOB: 1975-12-05, 36 y.o.   MRN: [id number]  HPI    Review of Systems     Objective:   Physical Exam        Assessment & Plan:  Bilateral Lumbar L3, L4  medial branch blocks and L 5 dorsal ramus injection under fluoroscopic guidance  Indication: Lumbar pain which is not relieved by medication management or other conservative care and interfering with self-care and mobility.  Informed consent was obtained after describing risks and benefits of the procedure with the patient, this includes bleeding, infection, paralysis and medication side effects.  The patient wishes to proceed and has given written consent.  The patient was placed in prone position.  The lumbar area was marked and prepped with Betadine.  One mL of 1% lidocaine was injected into each of 6 areas into the skin and subcutaneous tissue.  Then a 22-gauge 5 in spinal needle was inserted targeting the junction of the left S1 superior articular process and sacral ala junction. Needle was advanced under fluoroscopic guidance.  Bone contact was made.  Omnipaque 180 was injected x 0.5 mL demonstrating no intravascular uptake.  Then a solution containing one mL of 4 mg per mL dexamethasone and 3 mL of 2% MPF lidocaine was injected x 0.5 mL.  Then the left L5 superior articular process in transverse process junction was targeted.  Bone contact was made.  Omnipaque 180 was injected x 0.5 mL demonstrating no intravascular uptake. Then a solution containing one mL of 4 mg per mL dexamethasone and 3 mL of 2% MPF lidocaine was injected x 0.5 mL.  Then the left L4 superior articular process in transverse process junction was targeted.  Bone contact was made.  Omnipaque 180 was injected x 0.5 mL demonstrating no intravascular uptake.  Then a solution containing one mL of 4 mg per mL dexamethasone and 3 mL if 2% MPF lidocaine was injected x 0.5 mL.  This same procedure was performed on the  right side using the same needle, technique and injectate.  Patient tolerated procedure well.  Post procedure instructions were given.

## 2012-09-27 NOTE — Patient Instructions (Signed)
Facet Block A facet block is an injection procedure used to numb nerves near a spinal joint (facet). The injection usually includes a medicine like Novacaine (anesthetic) and a steroid medicine (similar to cortisone). The injections are made directly into the facet joint of the back. They are used for patients with several types of neck or back pain problems (such as worsening arthritis or persistent pain after surgery) that have not been helped with anti-inflammatory medications, exercise programs, physical therapy, and other forms of pain management. Multiple injections may be needed depending on how many joints are involved.  A facet block procedure can be helpful with diagnosis as well as providing therapeutic pain relief. One of three things may happen after the procedure:  The pain does not go away. This can mean that the pain is probably not coming from blocked facet joints. This information is helpful with diagnosis.  The pain goes away and stays away for a few hours but the original pain comes back and does not get better again. This information is also helpful with diagnosis. It can mean that pain is probably coming from the joints; but the steroid was not helpful for longer term pain control.  The pain goes away after the block, then returns later that day, and then gets better again over the next few days. This can mean that the block was helpful for pain control and the steroid had a longer lasting effect. If there is good, lasting benefit from the injections, the block may be repeated from 3 to 5 times. If there is good relief but it is only of short-term benefit, other procedures (such as radiofrequency lesioning) may be considered.  Note: The procedure cannot be performed if you have an active infection, a lesion on or near the area of injection, flu, cold, fever, very high blood pressure or if you are on blood thinners. Please make your doctor aware of any of these conditions. This is for  your safety!  LET YOUR CAREGIVER KNOW ABOUT:   Allergies.  Medications taken including herbs, eye drops, over the counter medications, and creams  Use of steroids (by mouth or creams).  Possible pregnancy, if applicable.  Previous problems with anesthetics or Novocaine.  History of blood clots.  History of bleeding or blood problems.  Previous surgery, particularly of the neck and/or back  Other health problems. RISKS AND COMPLICATIONS These are very uncommon but include:  Bleeding.  Injury to a nerve near the injection site.  Weakness or numbness in areas controlled by nerves near the injection site.  Infection.  Pain at the site of the injection.  Temporary fluid retention in those who are prone to this problem.  Allergic reaction to anesthetics or medicines used during the procedure. Diabetics may have a temporary increase in their blood sugar after any surgical procedure, especially if steroids are used. Stinging/burning of the numbing medicine is the most uncomfortable part of the procedure; however every person's response to any procedure is individual.  BEFORE THE PROCEDURE   Your caregiver will provide instructions about stopping any medication before the procedure.  Unless advised otherwise, if the injections are in your neck, you may take your medications as usual with a sip of water but do not eat or drink for 6 hours before the procedure.  Unless advised otherwise, you may eat, drink and take your medications as usual on the day of the procedure (both before and after) if the injections are to be in your lower back.    There is no other specific preparation necessary unless advised otherwise. PROCEDURE After checking your blood pressure, the procedure will be done in the x-ray (fluoroscopy) room while lying on your stomach. For procedures in the neck, an intravenous line is usually started. The back is then cleansed with an antiseptic soap. Sterile drapes are  placed in this area. The skin is numbed with a local anesthetic. This is felt as a stinging or burning sensation. Using x-ray guidance, needles are then advanced to the appropriate locations. Once the needles are in the proper location, the anesthetic and steroid is injected through the needles and the needles are removed. The skin is then cleansed and bandages are applied. Blood pressure will be checked again, and you will be discharged to leave with your ride after your caregiver says it is okay to go.  AFTER THE PROCEDURE  You may not drive for the remainder of the day after your procedure. An adult must be present to drive you home or to go with you in a taxi or on public transportation. The procedure will be canceled if you do not have a responsible adult with you! This is for your safety.  HOME CARE INSTRUCTIONS   The bandages noted above can be removed on the morning after the procedure.  Resume medications according to your caregiver's instructions.  No heat is to be used near or over the injected area(s) for the remainder of the day.  No tub bath or soaking in water (such as a pool, jacuzzi, etc.) for the remainder of the day.  Some local tenderness may be experienced for a couple of days after the injection. Using an ice pack three or four times a day will help this.  Keep track of the amount of pain relief as well as how long the pain relief lasted. SEEK MEDICAL CARE IF:   There is drainage from the injection site.  Pain is not controlled with medications prescribed.  There is significant bleeding or swelling. SEEK IMMEDIATE MEDICAL CARE IF:   You develop a fever of 101 F (38.3 C) or greater.  Worsening pain, swelling, and/or red streaking develops in the skin around the injection site.  Severe pain develops and cannot be controlled with medications prescribed.  You develop any headache, stiff neck, nausea, vomiting, or your eyes become very sensitive to light.  Weakness  or paralysis develops in arms or legs not present before the procedure.  You develop difficulty urinating or difficulty breathing. Document Released: 04/08/2007 Document Revised: 02/09/2012 Document Reviewed: 03/29/2009 Exit[employer] Patient Information 2013 [employer].  

## 2012-09-27 NOTE — Progress Notes (Signed)
  PROCEDURE RECORD [facility]   Name: Madison [name] DOB:January 27, 1976 MRN: [id number]  Date:09/27/2012  Physician: Claudette Laws, MD    Nurse/CMA: Shumaker RN  Allergies:  Allergies  Allergen Reactions  . Taxol (Paclitaxel)   . Tramadol   . Ultram (Tramadol Hcl)     Consent Signed: yes  Is patient diabetic? no  CBG today?   Pregnant: no LMP: No LMP recorded. Patient has had a hysterectomy. (age 38-55)  Anticoagulants: no Anti-inflammatory: no Antibiotics: no  Procedure: Medial Branch Block Bilateral Position: Prone Start Time: 11:14 End Time: 11:24 Fluoro Time: 24 seconds  RN/CMA Haematologist RN    Time 10:38 11:32    BP 133/59 138/70    Pulse 105 105    Respirations 14 14    O2 Sat 96% 97%    S/S 6 6    Pain Level 7/10 0/10     D/C home with Merton Border, patient A & O X 3, D/C instructions reviewed, and sits independently.

## 2012-10-06 ENCOUNTER — Other Ambulatory Visit: Payer: Self-pay | Admitting: *Deleted

## 2012-10-07 ENCOUNTER — Telehealth: Payer: Self-pay | Admitting: *Deleted

## 2012-10-07 MED ORDER — ESOMEPRAZOLE MAGNESIUM 20 MG PO CPDR: 20.0000 mg | DELAYED_RELEASE_CAPSULE | Freq: Every day | ORAL | Status: DC

## 2012-10-07 NOTE — Telephone Encounter (Signed)
PA required for Nexium. Form placed in MD box. 

## 2012-10-10 NOTE — Telephone Encounter (Signed)
Madison Fife, do you think we can just change it to omeprazole? If not, would you mind looking into which PPI is covered?

## 2012-10-11 MED ORDER — OMEPRAZOLE 20 MG PO CPDR
20.0000 mg | DELAYED_RELEASE_CAPSULE | Freq: Every day | ORAL | Status: DC
Start: 2012-10-11 — End: 2012-10-15

## 2012-10-11 NOTE — Addendum Note (Signed)
Addended by: Priscella Mann J on: 10/11/2012 09:54 AM   Modules accepted: Orders

## 2012-10-11 NOTE — Telephone Encounter (Signed)
Change Nexium to omeprazole

## 2012-10-11 NOTE — Telephone Encounter (Signed)
Please look on preferred medicaid list in office on [facility].

## 2012-10-14 ENCOUNTER — Other Ambulatory Visit: Payer: Self-pay | Admitting: Family Medicine

## 2012-10-14 NOTE — Telephone Encounter (Signed)
Patient is calling asking for a refill on Nexium to go to [facility] in [place].  The Prilosec doesn't work as well.  With her Epilipsy, she is having a lot of interrupted sleep and Nexium would help relieve the symptoms that interrupt her sleep.

## 2012-10-15 MED ORDER — ESOMEPRAZOLE MAGNESIUM 40 MG PO CPDR: 40.0000 mg | DELAYED_RELEASE_CAPSULE | Freq: Every day | ORAL | Status: DC

## 2012-10-15 NOTE — Telephone Encounter (Signed)
Rx sent to pharmacy, will fill out prior auth this afternoon.

## 2012-10-22 ENCOUNTER — Encounter: Payer: Self-pay | Admitting: Physical Medicine & Rehabilitation

## 2012-10-22 ENCOUNTER — Ambulatory Visit (HOSPITAL_BASED_OUTPATIENT_CLINIC_OR_DEPARTMENT_OTHER): Payer: Medicaid Other | Admitting: Physical Medicine & Rehabilitation

## 2012-10-22 ENCOUNTER — Encounter: Payer: Medicaid Other | Attending: Physical Medicine & Rehabilitation

## 2012-10-22 ENCOUNTER — Ambulatory Visit (HOSPITAL_BASED_OUTPATIENT_CLINIC_OR_DEPARTMENT_OTHER): Payer: Medicaid Other

## 2012-10-22 VITALS — BP 130/65 | HR 96 | Resp 18 | Ht 64.5 in | Wt 241.0 lb

## 2012-10-22 DIAGNOSIS — IMO0001 Reserved for inherently not codable concepts without codable children: Secondary | ICD-10-CM

## 2012-10-22 DIAGNOSIS — M47817 Spondylosis without myelopathy or radiculopathy, lumbosacral region: Secondary | ICD-10-CM | POA: Insufficient documentation

## 2012-10-22 DIAGNOSIS — Z79899 Other long term (current) drug therapy: Secondary | ICD-10-CM | POA: Insufficient documentation

## 2012-10-22 DIAGNOSIS — M79609 Pain in unspecified limb: Secondary | ICD-10-CM | POA: Insufficient documentation

## 2012-10-22 DIAGNOSIS — M542 Cervicalgia: Secondary | ICD-10-CM | POA: Insufficient documentation

## 2012-10-22 DIAGNOSIS — T451X5A Adverse effect of antineoplastic and immunosuppressive drugs, initial encounter: Secondary | ICD-10-CM | POA: Insufficient documentation

## 2012-10-22 DIAGNOSIS — M25559 Pain in unspecified hip: Secondary | ICD-10-CM | POA: Insufficient documentation

## 2012-10-22 DIAGNOSIS — M549 Dorsalgia, unspecified: Secondary | ICD-10-CM | POA: Insufficient documentation

## 2012-10-22 DIAGNOSIS — R209 Unspecified disturbances of skin sensation: Secondary | ICD-10-CM | POA: Insufficient documentation

## 2012-10-22 DIAGNOSIS — M7061 Trochanteric bursitis, right hip: Secondary | ICD-10-CM

## 2012-10-22 DIAGNOSIS — M25569 Pain in unspecified knee: Secondary | ICD-10-CM | POA: Insufficient documentation

## 2012-10-22 DIAGNOSIS — G894 Chronic pain syndrome: Secondary | ICD-10-CM | POA: Insufficient documentation

## 2012-10-22 DIAGNOSIS — M76899 Other specified enthesopathies of unspecified lower limb, excluding foot: Secondary | ICD-10-CM

## 2012-10-22 DIAGNOSIS — G620 Drug-induced polyneuropathy: Secondary | ICD-10-CM | POA: Insufficient documentation

## 2012-10-22 DIAGNOSIS — C50019 Malignant neoplasm of nipple and areola, unspecified female breast: Secondary | ICD-10-CM

## 2012-10-22 DIAGNOSIS — C50919 Malignant neoplasm of unspecified site of unspecified female breast: Secondary | ICD-10-CM

## 2012-10-22 DIAGNOSIS — Z452 Encounter for adjustment and management of vascular access device: Secondary | ICD-10-CM

## 2012-10-22 MED ORDER — HEPARIN SOD (PORK) LOCK FLUSH 100 UNIT/ML IV SOLN
500.0000 [IU] | Freq: Once | INTRAVENOUS | Status: AC
  Administered 2012-10-22: 500 [IU] via INTRAVENOUS
  Filled 2012-10-22: qty 5

## 2012-10-22 MED ORDER — SODIUM CHLORIDE 0.9 % IJ SOLN
10.0000 mL | INTRAMUSCULAR | Status: DC | PRN
  Administered 2012-10-22: 10 mL via INTRAVENOUS
  Filled 2012-10-22: qty 10

## 2012-10-22 MED ORDER — GABAPENTIN 600 MG PO TABS: 600.0000 mg | ORAL_TABLET | Freq: Three times a day (TID) | ORAL | Status: DC

## 2012-10-22 NOTE — Progress Notes (Signed)
No blood return noted with port flush.  Port flushed well.  10cc NS and 500 units heparin flushed into port, will leave msg for Dr Renelda Loma RN.  SLJ

## 2012-10-22 NOTE — Progress Notes (Signed)
Subjective:    Patient ID: Madison [name], female    DOB: [birth year], 36 y.o.   MRN: [id number] Chief complaint hip pain and back pain HPI 100% pain relief of low back pain following L3-L4-L5 injections performed 07/01/2012 and 09/27/2012.First injection lasted between 2 and 3 months second injection lasted between 2 and 3 weeks.  Pain Inventory Average Pain 7 Pain Right Now 7 My pain is burning, dull, tingling and aching  In the last 24 hours, has pain interfered with the following? General activity 6 Relation with others 3 Enjoyment of life 7 What TIME of day is your pain at its worst? morning, evening and night Sleep (in general) Fair  Pain is worse with: bending, sitting, standing and some activites Pain improves with: rest, heat/ice, pacing activities, medication and injections Relief from Meds: 8  Mobility walk without assistance use a cane how many minutes can you walk? 10 ability to climb steps?  yes do you drive?  no  Function disabled: date disabled  I need assistance with the following:  dressing, bathing, meal prep, household duties and shopping  Neuro/Psych weakness numbness tremor tingling trouble walking spasms dizziness confusion depression anxiety  Prior Studies Any changes since last visit?  no  Physicians involved in your care Any changes since last visit?  no   History reviewed. No pertinent family history. History   Social History  . Marital Status: Legally Separated    Spouse Name: N/A    Number of Children: N/A  . Years of Education: N/A   Social History Main Topics  . Smoking status: Current Every Day Smoker -- 1.0 packs/day    Types: Cigarettes  . Smokeless tobacco: Never Used     Comment: 1 ppd for 20 years   . Alcohol Use: Yes     Comment: minimal   . Drug Use: None  . Sexually Active: None   Other Topics Concern  . None   Social History Narrative   Living w parents in [place] due to cancer and inability to take car  for herself or others. Has 3 children. 25 yo has bipolar and MR, currently in foster home. Other 2 live with her. Divorced.    Past Surgical History  Procedure Date  . Appendectomy   . Tonsillectomy   . Tubal ligation   . Mastectomy     bilateral  . Cholecystectomy March 2011  . Abdominal hysterectomy March 2012  . Breast surgery     bilateral mastectomy  . Port-a-cath insertion    Past Medical History  Diagnosis Date  . Tobacco user   . Grand mal seizure   . Fibromyalgia   . Seizures   . Breast CA   . GERD (gastroesophageal reflux disease)   . Depression   . Arthritis   . Allergy   . Hypertension    BP 130/65  Pulse 96  Resp 18  Ht 5' 4.5" (1.638 m)  Wt 241 lb (109.317 kg)  BMI 40.73 kg/m2  SpO2 94%    Review of Systems  Constitutional: Positive for unexpected weight change.  Musculoskeletal: Positive for myalgias, arthralgias and gait problem.  Neurological: Positive for dizziness, tremors, weakness and numbness.       Spasms, tingling  Psychiatric/Behavioral: Positive for confusion and dysphoric mood. The patient is nervous/anxious.   All other systems reviewed and are negative.       Objective:   Physical Exam  Nursing note and vitals reviewed. Constitutional: She is oriented to person, place,  and time. She appears well-developed.       obese  HENT:  Head: Normocephalic and atraumatic.  Eyes: Conjunctivae normal and EOM are normal. Pupils are equal, round, and reactive to light.  Musculoskeletal:       Right shoulder: She exhibits decreased range of motion and pain. She exhibits no deformity.       Right hip: She exhibits tenderness.       Left hip: She exhibits tenderness.       Lumbar back: She exhibits decreased range of motion and tenderness.       Greater trochanter tenderness bilateral  Neurological: She is alert and oriented to person, place, and time. She has normal strength. A sensory deficit is present. Gait normal.  Reflex Scores:       Patellar reflexes are 2+ on the right side and 2+ on the left side.      Achilles reflexes are 2+ on the right side and 2+ on the left side.      Right L5 and S1 dermatomal distribution          Assessment & Plan:  1. Lumbar spondylosis with positive results from 2 sets the medial branch blocks. Good candidate for radiofrequency neurotomy starting with the right side at L3-L4 via branches and L5 dorsal ramus  2. Bilateral trochanteric bursitis failing conservative care including nonsteroidals and stretching. Will inject bilateral hips today  Trochanteric bursa injection Indicated by above Informed consent was obtained after describing risks and benefits of the procedure with the patient these include bleeding bruising and infection she elects to proceed and has given written consent Patient placed in a right lateral decubitus position area of the left hip was marked and prepped with Betadine and alcohol entered with a 21-gauge 2 inch needle. After negative draw back for blood 1 cc of Depo-Medrol and 4 cc of 1% lidocaine were injected the same procedure was repeated on the right side using same needle injected and technique, patient tolerated procedure well. Post procedure instructions

## 2012-10-22 NOTE — Patient Instructions (Signed)
Performed bilateral greater trochanter injections today we can repeat in 3 more months if needed  Schedule for right L3-L4-L5 radiofrequency neurotomy in 2 weeks

## 2012-11-01 ENCOUNTER — Ambulatory Visit (INDEPENDENT_AMBULATORY_CARE_PROVIDER_SITE_OTHER): Payer: Medicaid Other | Admitting: Family Medicine

## 2012-11-01 ENCOUNTER — Encounter: Payer: Self-pay | Admitting: Family Medicine

## 2012-11-01 ENCOUNTER — Telehealth (HOSPITAL_COMMUNITY): Payer: Self-pay

## 2012-11-01 VITALS — BP 138/76 | HR 106 | Temp 98.6°F | Ht 65.5 in | Wt 243.0 lb

## 2012-11-01 DIAGNOSIS — G8929 Other chronic pain: Secondary | ICD-10-CM

## 2012-11-01 DIAGNOSIS — C50919 Malignant neoplasm of unspecified site of unspecified female breast: Secondary | ICD-10-CM

## 2012-11-01 DIAGNOSIS — I158 Other secondary hypertension: Secondary | ICD-10-CM

## 2012-11-01 DIAGNOSIS — F329 Major depressive disorder, single episode, unspecified: Secondary | ICD-10-CM

## 2012-11-01 DIAGNOSIS — I159 Secondary hypertension, unspecified: Secondary | ICD-10-CM

## 2012-11-01 MED ORDER — METHOCARBAMOL 500 MG PO TABS: 500.0000 mg | ORAL_TABLET | Freq: Two times a day (BID) | ORAL | Status: DC | PRN

## 2012-11-01 MED ORDER — HYDROCODONE-ACETAMINOPHEN 5-500 MG PO TABS: 1.0000 | ORAL_TABLET | Freq: Four times a day (QID) | ORAL | Status: DC | PRN

## 2012-11-01 MED ORDER — QUAD CANE MISC: Status: DC

## 2012-11-01 MED ORDER — AQUORAL MT AERS: 2.0000 | INHALATION_SPRAY | Freq: Three times a day (TID) | OROMUCOSAL | Status: DC

## 2012-11-01 NOTE — Patient Instructions (Signed)
It was good to see you.  Your blood pressure today was BP: 138/76 mmHg.  Remember your goal blood pressure is about 130/80.  Please be sure to take your medication every day.    I am glad your appointments at pain management are going well. Please take the vicodin only when needed.

## 2012-11-01 NOTE — Progress Notes (Signed)
  Subjective:    Patient ID: Madison [name], female    DOB: 1976-07-28, 36 y.o.   MRN: [id number]  HPI  Tiare comes in for follow up.  She says she is doing OK.   Depression- on Amitriptyline and Cymbalta.  Mood is OK now, but Dr. Modesto Charon (Neurologist) tried to taper Amitriptyline due to weight gain.  Ellsie says she had increased depressive symptoms and pain, was crying a lot, so they went back up on it.   Pain- Pain management is going fairly well. She has been having epidural injections which help her quite a bit.  She is able to be more active.  She states she only takes Vicodin on really bad days- this happens a few times a month.   HTN: Taking Capozide without difficulty.  Denies chest pain, dizziness, palpitations, LE edema.  Patient does not check blood pressures.   I have reviewed the patient's medical history in detail and updated the computerized patient record.  Review of Systems Pertinent items in HPI    Objective:   Physical Exam BP 138/76  Pulse 106  Temp 98.6 F (37 C) (Oral)  Ht 5' 5.5" (1.664 m)  Wt 243 lb (110.224 kg)  BMI 39.82 kg/m2 General appearance: alert, cooperative and no distress Back: symmetric, no curvature. ROM normal. No CVA tenderness.Paraspinal muscle tenderness.  Lungs: clear to auscultation bilaterally Heart: regular rate and rhythm, S1, S2 normal, no murmur, click, rub or gallop Extremities: extremities normal, atraumatic, no cyanosis or edema Pulses: 2+ and symmetric       Assessment & Plan:

## 2012-11-03 NOTE — Assessment & Plan Note (Signed)
Improved quality of life with pain management's help.  Will refill vicodin for as needed on bad days.  Patient aware of risk of narcotic dependence and does not take medication daily.

## 2012-11-03 NOTE — Assessment & Plan Note (Signed)
This is stable, possibly improving mood with improved pain management.  No changes tday.

## 2012-11-03 NOTE — Assessment & Plan Note (Signed)
Good control on current medications, no changes.

## 2012-11-08 ENCOUNTER — Encounter: Payer: Self-pay | Admitting: Family Medicine

## 2012-11-10 ENCOUNTER — Telehealth: Payer: Self-pay | Admitting: *Deleted

## 2012-11-10 ENCOUNTER — Telehealth: Payer: Self-pay | Admitting: Family Medicine

## 2012-11-10 NOTE — Telephone Encounter (Signed)
Patient is calling because she needs the Rx plus her demographic information and insurance info for a [employer] faxed to [employer].  They can deliver it to her.  The fax # is [phone].

## 2012-11-10 NOTE — Telephone Encounter (Signed)
Neurontin rx given at last appointment for her RF scheduled on 11/18/12.  How should she take it?

## 2012-11-11 ENCOUNTER — Other Ambulatory Visit: Payer: Self-pay | Admitting: Family Medicine

## 2012-11-11 MED ORDER — QUAD CANE MISC: Status: DC

## 2012-11-11 NOTE — Telephone Encounter (Signed)
Rx and SnapShot printed, placed in fax pile.

## 2012-11-11 NOTE — Telephone Encounter (Signed)
Take it after the RF when she gets home

## 2012-11-12 NOTE — Telephone Encounter (Signed)
Notified Liany.

## 2012-11-18 ENCOUNTER — Ambulatory Visit (HOSPITAL_BASED_OUTPATIENT_CLINIC_OR_DEPARTMENT_OTHER): Payer: Medicaid Other | Admitting: Physical Medicine & Rehabilitation

## 2012-11-18 ENCOUNTER — Encounter: Payer: Self-pay | Admitting: Physical Medicine & Rehabilitation

## 2012-11-18 ENCOUNTER — Encounter: Payer: Medicaid Other | Attending: Physical Medicine & Rehabilitation

## 2012-11-18 VITALS — BP 162/96 | HR 94 | Resp 14 | Ht 64.5 in | Wt 252.0 lb

## 2012-11-18 DIAGNOSIS — M25569 Pain in unspecified knee: Secondary | ICD-10-CM | POA: Insufficient documentation

## 2012-11-18 DIAGNOSIS — G620 Drug-induced polyneuropathy: Secondary | ICD-10-CM | POA: Insufficient documentation

## 2012-11-18 DIAGNOSIS — M25559 Pain in unspecified hip: Secondary | ICD-10-CM | POA: Insufficient documentation

## 2012-11-18 DIAGNOSIS — R209 Unspecified disturbances of skin sensation: Secondary | ICD-10-CM | POA: Insufficient documentation

## 2012-11-18 DIAGNOSIS — M79609 Pain in unspecified limb: Secondary | ICD-10-CM | POA: Insufficient documentation

## 2012-11-18 DIAGNOSIS — IMO0001 Reserved for inherently not codable concepts without codable children: Secondary | ICD-10-CM | POA: Insufficient documentation

## 2012-11-18 DIAGNOSIS — G894 Chronic pain syndrome: Secondary | ICD-10-CM | POA: Insufficient documentation

## 2012-11-18 DIAGNOSIS — M47817 Spondylosis without myelopathy or radiculopathy, lumbosacral region: Secondary | ICD-10-CM

## 2012-11-18 DIAGNOSIS — M549 Dorsalgia, unspecified: Secondary | ICD-10-CM | POA: Insufficient documentation

## 2012-11-18 DIAGNOSIS — T451X5A Adverse effect of antineoplastic and immunosuppressive drugs, initial encounter: Secondary | ICD-10-CM | POA: Insufficient documentation

## 2012-11-18 DIAGNOSIS — M76899 Other specified enthesopathies of unspecified lower limb, excluding foot: Secondary | ICD-10-CM | POA: Insufficient documentation

## 2012-11-18 DIAGNOSIS — R251 Tremor, unspecified: Secondary | ICD-10-CM | POA: Insufficient documentation

## 2012-11-18 DIAGNOSIS — Z79899 Other long term (current) drug therapy: Secondary | ICD-10-CM | POA: Insufficient documentation

## 2012-11-18 DIAGNOSIS — M542 Cervicalgia: Secondary | ICD-10-CM | POA: Insufficient documentation

## 2012-11-18 NOTE — Patient Instructions (Signed)
Radiofrequency Lesioning Care After Refer to this sheet in the next few weeks. These instructions provide you with information on caring for yourself after your procedure. Your caregiver may also give you more specific instructions. Your treatment has been planned according to current medical practices, but problems sometimes occur. Call your caregiver if you have any problems or questions after your procedure. HOME CARE INSTRUCTIONS  Take pain medicine as directed by your caregiver.  You may have pain from the burned nerve for a while after the procedure. This takes time to heal. Ask your caregiver how long you should expect pain.  You should be able to return to your normal activities a couple of days after the procedure. When you can go back to work will depend on the type of work you do. Discuss this with your caregiver.  Pay close attention to how you feel after the procedure. If you start having pain, write down when it hurts and how it feels. This will help you and your caregiver know if you need another treatment. SEEK MEDICAL CARE IF:  The site where the needle was inserted for the procedure becomes red, swollen, or tender to touch.  Your pain does not get better. SEEK IMMEDIATE MEDICAL CARE IF:  You develop sudden, severe pain.  You develop numbness or tingling near the procedure site.  You have a fever. MAKE SURE YOU:  Understand these instructions.  Will watch your condition.  Will get help right away if you are not doing well or get worse. Document Released: 07/16/2011 Document Revised: 02/09/2012 Document Reviewed: 07/16/2011 Exit[employer] Patient Information 2013 [employer].  

## 2012-11-18 NOTE — Progress Notes (Signed)
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  Informed consent was obtained after describing risks and benefits of the procedure with the patient, this includes bleeding, bruising, infection, paralysis and medication side effects. The patient wishes to proceed and has given written consent. The patient was placed in a prone position. The lumbar and sacral area was marked and prepped with Betadine. A 25-gauge 1-1/2 inch needle was inserted into the skin and subcutaneous tissue at 3 sites in one ML of 1% lidocaine was injected into each site. Then a 20-gauge 15 cm radio frequency needle with a 1 cm curved active tip was inserted targeting the Right S1 SAP/sacral ala junction. Bone contact was made and confirmed with lateral imaging. Sensory stimulation at 50 Hz followed by motor stimulation at 2 Hz confirm proper needle location followed by injection of one ML of the solution containing one ML of 4 mg per mL dexamethasone and 3 mL of 1% MPF lidocaine. Then the Right L5 SAP/transverse process junction was targeted. Bone contact was made and confirmed with lateral imaging. Sensory stimulation at 50 Hz followed by motor stimulation at 2 Hz confirm proper needle location followed by injection of one ML of the solution containing one ML of 4 mg per mL dexamethasone and 3 mL of 1% MPF lidocaine. Then the Right L4 SAP/transverse process junction was targeted. Bone contact was made and confirmed with lateral imaging. Sensory stimulation at 50 Hz followed by motor stimulation at 2 Hz confirm proper needle location followed by injection of one ML of the solution containing one ML of 4 mg per mL dexamethasone and 3 mL of 1% MPF lidocaine. Radio frequency lesion being at [facility] for 90 seconds  was performed. Needles were removed. Post procedure instructions and vital signs were performed. Patient tolerated procedure well. Followup appointment was given.  May use 10 cm next RF in 1 month

## 2012-11-18 NOTE — Progress Notes (Signed)
  PROCEDURE RECORD [facility]   Name: Madison [name] DOB:1976/08/12 MRN: [id number]  Date:11/18/2012  Physician: Claudette Laws, MD    Nurse/CMA: Shumaker RN  Allergies:  Allergies  Allergen Reactions  . Taxol (Paclitaxel)   . Tramadol   . Ultram (Tramadol Hcl)     Consent Signed: yes  Is patient diabetic? No.  CBG today?   Pregnant: no LMP: No LMP recorded. Patient has had a hysterectomy. (age 36-55)  Anticoagulants: no Anti-inflammatory: no Antibiotics: no  Procedure: right Radiofrequency Neurotomy Position: Prone Start Time: 11:25am End Time: 11:43  Fluoro Time: 22  RN/CMA Shumaker Rn Shumaker RN    Time 1117     BP 162/96 146/91    Pulse 94 102    Respirations 14 14    O2 Sat 96 96    S/S 6 6    Pain Level 9/10 9/10     D/C home with boyfriend, patient A & O X 3, D/C instructions reviewed, and sits independently.

## 2012-11-22 ENCOUNTER — Telehealth: Payer: Self-pay | Admitting: *Deleted

## 2012-11-22 ENCOUNTER — Telehealth: Payer: Self-pay | Admitting: Family Medicine

## 2012-11-22 NOTE — Telephone Encounter (Signed)
Spoke with patient and she has called Dr. Gwendalyn Ege  office and was told to call  Hospital [phone] and ask to speak with one of the PA.'s. She did this but was transferred to the ED.  Was told to come in to be examined. She just  wants to know if this pain that she is having is to be expected . I called [employer] [facility] [employer] and was told that Dr. Gwendalyn Ege does have a doctor on call  for the practice  and for patient to call back and ask for operator [place] and she will page the doctor on call to call patient back. Patient voices understanding.

## 2012-11-22 NOTE — Telephone Encounter (Signed)
Patient had a Medial Branch Block Radio Frequency procedure on 12/19. She is in a severe amount of pain. Would like to know what we recommend? Would also like to know if Dr. Wynn Banker has any appointments on 12/26 or 12/27 for her to be seen. Notified patient to call [phone] and ask to speak to one of the PA's about her pain after having the procedure. Patient state she would call.

## 2012-11-22 NOTE — Telephone Encounter (Signed)
Pt had an injection done at Dr Wynn Banker office and she is in more pain than before - just needs to talk to nurse about whether this is normal or not.  Had called over to their office and the nurse just told her to call the ER.  That didn't help her determine if she needs to be seen.  Just wants to know if this type of injection is normal to have this pain afterwards.

## 2012-11-25 ENCOUNTER — Ambulatory Visit: Payer: Medicaid Other | Admitting: Physical Medicine & Rehabilitation

## 2012-12-06 ENCOUNTER — Other Ambulatory Visit: Payer: Self-pay | Admitting: Family Medicine

## 2012-12-10 ENCOUNTER — Telehealth: Payer: Self-pay

## 2012-12-10 NOTE — Telephone Encounter (Signed)
Patient called wanting to speak with someone about her last injection and she wants to cancel her next injection.  Please call.

## 2012-12-10 NOTE — Telephone Encounter (Signed)
Left message for patient to call office regarding her last injection and next appointment.

## 2012-12-10 NOTE — Telephone Encounter (Signed)
Patient was in a lot of pain after last injection.  She is no longer taking gabapentin.  She says her entire lower back down her right leg hurts. She is using heat and ice.  Denies over doing it.  Patient says her PCP prescribes her vicodin, but she has to increase use.  She is also taking 800mg  ibuprofen.  Patient is going to schedule a follow up appointment.  She was also advised before when she called that she was in pain and she should follow the discharge instruction that she was given the day of her procedure because there were no providers in the office that day.

## 2012-12-13 ENCOUNTER — Encounter: Payer: Self-pay | Admitting: Physical Medicine & Rehabilitation

## 2012-12-13 ENCOUNTER — Ambulatory Visit (HOSPITAL_BASED_OUTPATIENT_CLINIC_OR_DEPARTMENT_OTHER): Payer: Medicare Other | Admitting: Physical Medicine & Rehabilitation

## 2012-12-13 ENCOUNTER — Encounter: Payer: Medicaid Other | Attending: Physical Medicine & Rehabilitation

## 2012-12-13 VITALS — BP 166/98 | HR 101 | Resp 16 | Ht 65.5 in | Wt 250.8 lb

## 2012-12-13 DIAGNOSIS — M25569 Pain in unspecified knee: Secondary | ICD-10-CM | POA: Insufficient documentation

## 2012-12-13 DIAGNOSIS — M76899 Other specified enthesopathies of unspecified lower limb, excluding foot: Secondary | ICD-10-CM | POA: Insufficient documentation

## 2012-12-13 DIAGNOSIS — M542 Cervicalgia: Secondary | ICD-10-CM | POA: Insufficient documentation

## 2012-12-13 DIAGNOSIS — M25559 Pain in unspecified hip: Secondary | ICD-10-CM | POA: Insufficient documentation

## 2012-12-13 DIAGNOSIS — T451X5A Adverse effect of antineoplastic and immunosuppressive drugs, initial encounter: Secondary | ICD-10-CM | POA: Insufficient documentation

## 2012-12-13 DIAGNOSIS — R209 Unspecified disturbances of skin sensation: Secondary | ICD-10-CM | POA: Insufficient documentation

## 2012-12-13 DIAGNOSIS — Z79899 Other long term (current) drug therapy: Secondary | ICD-10-CM | POA: Insufficient documentation

## 2012-12-13 DIAGNOSIS — G894 Chronic pain syndrome: Secondary | ICD-10-CM | POA: Insufficient documentation

## 2012-12-13 DIAGNOSIS — M79609 Pain in unspecified limb: Secondary | ICD-10-CM | POA: Insufficient documentation

## 2012-12-13 DIAGNOSIS — IMO0001 Reserved for inherently not codable concepts without codable children: Secondary | ICD-10-CM | POA: Insufficient documentation

## 2012-12-13 DIAGNOSIS — G620 Drug-induced polyneuropathy: Secondary | ICD-10-CM | POA: Insufficient documentation

## 2012-12-13 DIAGNOSIS — M47817 Spondylosis without myelopathy or radiculopathy, lumbosacral region: Secondary | ICD-10-CM | POA: Insufficient documentation

## 2012-12-13 DIAGNOSIS — M549 Dorsalgia, unspecified: Secondary | ICD-10-CM | POA: Insufficient documentation

## 2012-12-13 MED ORDER — KETOROLAC TROMETHAMINE 60 MG/2ML IM SOLN
60.0000 mg | Freq: Once | INTRAMUSCULAR | Status: AC
  Administered 2012-12-13: 60 mg via INTRAMUSCULAR

## 2012-12-13 NOTE — Progress Notes (Signed)
Subjective:    Patient ID: Madison [name], female    DOB: [birth year], 37 y.o.   MRN: [id number]  HPI Complains of increased neck right shoulder and right-sided low back pain. Radiofrequency neurotomy of L3-4-5 in December of 2013. Patient states it did not help despite she responded twice to medial branch blocks at the same levels. Back Pain is worsened by the bending and picking up items Also has pain from the box to the posterior thigh on the right side [facility] 11/22/2012.     Pain Inventory Average Pain 8 Pain Right Now 9 My pain is constant, sharp, burning and aching  In the last 24 hours, has pain interfered with the following? General activity 9 Relation with others 9 Enjoyment of life 10 What TIME of day is your pain at its worst? all of the time Sleep (in general) Poor  Pain is worse with: bending, sitting and some activites Pain improves with: rest, heat/ice, pacing activities and medication Relief from Meds: 1  Mobility use a cane  Function disabled: date disabled   Neuro/Psych weakness numbness tingling trouble walking spasms depression anxiety  Prior Studies Any changes since last visit?  no  Physicians involved in your care Any changes since last visit?  no   History reviewed. No pertinent family history. History   Social History  . Marital Status: Legally Separated    Spouse Name: N/A    Number of Children: N/A  . Years of Education: N/A   Social History Main Topics  . Smoking status: Current Every Day Smoker -- 1.0 packs/day    Types: Cigarettes  . Smokeless tobacco: Never Used     Comment: 1 ppd for 20 years   . Alcohol Use: Yes     Comment: minimal   . Drug Use: None  . Sexually Active: None   Other Topics Concern  . None   Social History Narrative   Living w parents in [place] due to cancer and inability to take car for herself or others. Has 3 children. 61 yo has bipolar and MR, currently in foster home. Other 2 live with her.  Divorced.    Past Surgical History  Procedure Date  . Appendectomy   . Tonsillectomy   . Tubal ligation   . Mastectomy     bilateral  . Cholecystectomy March 2011  . Abdominal hysterectomy March 2012  . Breast surgery     bilateral mastectomy  . Port-a-cath insertion    Past Medical History  Diagnosis Date  . Tobacco user   . Grand mal seizure   . Fibromyalgia   . Seizures   . Breast CA   . GERD (gastroesophageal reflux disease)   . Depression   . Arthritis   . Allergy   . Hypertension    BP 166/98  Pulse 101  Resp 16  Ht 5' 5.5" (1.664 m)  Wt 250 lb 12.8 oz (113.762 kg)  BMI 41.10 kg/m2  SpO2 96%    Review of Systems  Musculoskeletal: Positive for back pain and gait problem.       Spasm  Neurological: Positive for weakness and numbness.       Tingling--had seizure yest  Psychiatric/Behavioral: Positive for dysphoric mood. The patient is nervous/anxious.   All other systems reviewed and are negative.       Objective:   Physical Exam  Constitutional: She is oriented to person, place, and time. She appears well-developed.  obese  HENT:  Head: Normocephalic and atraumatic.  Neck: Normal range of motion.  Musculoskeletal:  Right hip: She exhibits tenderness. She exhibits normal range of motion.  Left hip: She exhibits tenderness. She exhibits normal range of motion.  Right knee: She exhibits normal range of motion, no swelling, no effusion, no deformity, no LCL laxity, normal patellar mobility and no MCL laxity. tenderness found. Medial joint line and patellar tendon tenderness noted.  Cervical back: She exhibits decreased range of motion and tenderness. She exhibits no swelling, no edema, no deformity and no spasm.  Tenderness at multiple 18/18 fibromyalgia tender points.  Neurological: She is alert and oriented to person, place, and time. She has normal strength. She displays no atrophy. A sensory deficit is present. She exhibits normal muscle tone. Gait  abnormal.  Reflex Scores:  Tricep reflexes are 2+ on the right side and 2+ on the left side.  Bicep reflexes are 2+ on the right side and 2+ on the left side.  Brachioradialis reflexes are 2+ on the right side and 2+ on the left side.  Patellar reflexes are 2+ on the right side and 2+ on the left side.  Achilles reflexes are 2+ on the right side and 2+ on the left side. Sensation reduced in a glove and stocking pattern.  Psychiatric: Her speech is normal and behavior is normal. Judgment and thought content normal. Her mood appears anxious. Her affect is labile. Cognition and memory are normal.       Assessment & Plan:     Assessment & Plan:   1. Fibromyalgia syndrome this is accounting for a lot of the soreness in the muscles in the neck back limbs.recommend Regular aerobic activity which the patient really has not been doing. Toradol injection 2. Back pain Still may have some soreness from the radiofrequency procedure At this point will not pursue Left RF           3. Chronic pain syndrome: She is a hearty on numerous pain medications. I do not think adding another will be of any benefit. Our clinic will focus on injections as well as physical therapy.I discussed this with the patient and she agrees with my plan. 4.  Probable myofascial pain right trapezius will inject today With lidocaine 1%  Right trapezius trigger point injection Indication myofascial pain unresponsive to conservative care Patient placed in a seated position 3 areas in the upper aspect were marked and prepped with Betadine: Spray was used prior to injection. 0.75 cc of 1% lidocaine injected into each area with trigger point deactivation. Patient tolerated procedure well post procedure instructions given.

## 2012-12-13 NOTE — Addendum Note (Signed)
Addended by: Doreene Eland on: 12/13/2012 03:31 PM   Modules accepted: Orders

## 2012-12-13 NOTE — Patient Instructions (Addendum)
We did trigger point injection with lidocaine in the right trapezius, This is a numbing medicine We injected Toradol into the buttocks area, this is an anti-inflammatory No radiofrequency next week See you in one month will discuss whether we will do a left-sided radiofrequency

## 2012-12-14 ENCOUNTER — Telehealth: Payer: Self-pay | Admitting: Oncology

## 2012-12-14 NOTE — Telephone Encounter (Signed)
Pt lmonvm re needing flush and next appt w/PR. Returned pt's call but was not able to reach her. Asked that pt call office back re appts w/PR and ask for Baltazar Apo in the breast clinic. Copy of last pof for 8/27 requesting 37month f/u forwarded to Tami.

## 2012-12-21 ENCOUNTER — Ambulatory Visit: Payer: Medicaid Other | Admitting: Physical Medicine & Rehabilitation

## 2012-12-22 ENCOUNTER — Ambulatory Visit: Payer: Medicaid Other | Admitting: Physical Medicine and Rehabilitation

## 2013-01-05 ENCOUNTER — Telehealth: Payer: Self-pay | Admitting: Family Medicine

## 2013-01-05 ENCOUNTER — Encounter: Payer: Self-pay | Admitting: Family Medicine

## 2013-01-05 NOTE — Telephone Encounter (Signed)
Patient is calling because after 1/1, pharmacies will not accept Vicodin Rx for 5/500, they will only accept for  5/325 so she needs a new Rx.  [facility] [place] and they said that it can be faxed.

## 2013-01-07 NOTE — Telephone Encounter (Signed)
Patient is calling back because she hasn't gotten a reply to her call from 2/5.  She recently had a procedure done so she really needs her pain medication.  She also sent a message on My Chart.

## 2013-01-11 ENCOUNTER — Encounter: Payer: Self-pay | Admitting: Family Medicine

## 2013-01-11 ENCOUNTER — Ambulatory Visit (HOSPITAL_BASED_OUTPATIENT_CLINIC_OR_DEPARTMENT_OTHER): Payer: Medicaid Other | Admitting: Physical Medicine & Rehabilitation

## 2013-01-11 ENCOUNTER — Encounter: Payer: Medicaid Other | Attending: Physical Medicine & Rehabilitation

## 2013-01-11 ENCOUNTER — Encounter: Payer: Self-pay | Admitting: Physical Medicine & Rehabilitation

## 2013-01-11 ENCOUNTER — Ambulatory Visit (INDEPENDENT_AMBULATORY_CARE_PROVIDER_SITE_OTHER): Payer: Medicaid Other | Admitting: Family Medicine

## 2013-01-11 VITALS — BP 144/64 | HR 93 | Ht 64.5 in | Wt 249.0 lb

## 2013-01-11 VITALS — BP 157/75 | HR 92 | Resp 14 | Ht 65.0 in | Wt 249.6 lb

## 2013-01-11 DIAGNOSIS — M47817 Spondylosis without myelopathy or radiculopathy, lumbosacral region: Secondary | ICD-10-CM | POA: Insufficient documentation

## 2013-01-11 DIAGNOSIS — J328 Other chronic sinusitis: Secondary | ICD-10-CM

## 2013-01-11 DIAGNOSIS — M549 Dorsalgia, unspecified: Secondary | ICD-10-CM | POA: Insufficient documentation

## 2013-01-11 DIAGNOSIS — IMO0001 Reserved for inherently not codable concepts without codable children: Secondary | ICD-10-CM | POA: Insufficient documentation

## 2013-01-11 DIAGNOSIS — F329 Major depressive disorder, single episode, unspecified: Secondary | ICD-10-CM

## 2013-01-11 DIAGNOSIS — M76899 Other specified enthesopathies of unspecified lower limb, excluding foot: Secondary | ICD-10-CM | POA: Insufficient documentation

## 2013-01-11 DIAGNOSIS — Z79899 Other long term (current) drug therapy: Secondary | ICD-10-CM | POA: Insufficient documentation

## 2013-01-11 DIAGNOSIS — R300 Dysuria: Secondary | ICD-10-CM

## 2013-01-11 DIAGNOSIS — M25559 Pain in unspecified hip: Secondary | ICD-10-CM | POA: Insufficient documentation

## 2013-01-11 DIAGNOSIS — R209 Unspecified disturbances of skin sensation: Secondary | ICD-10-CM | POA: Insufficient documentation

## 2013-01-11 DIAGNOSIS — R6883 Chills (without fever): Secondary | ICD-10-CM

## 2013-01-11 DIAGNOSIS — R569 Unspecified convulsions: Secondary | ICD-10-CM

## 2013-01-11 DIAGNOSIS — M25569 Pain in unspecified knee: Secondary | ICD-10-CM | POA: Insufficient documentation

## 2013-01-11 DIAGNOSIS — M542 Cervicalgia: Secondary | ICD-10-CM | POA: Insufficient documentation

## 2013-01-11 DIAGNOSIS — T451X5A Adverse effect of antineoplastic and immunosuppressive drugs, initial encounter: Secondary | ICD-10-CM | POA: Insufficient documentation

## 2013-01-11 DIAGNOSIS — G894 Chronic pain syndrome: Secondary | ICD-10-CM | POA: Insufficient documentation

## 2013-01-11 DIAGNOSIS — G620 Drug-induced polyneuropathy: Secondary | ICD-10-CM | POA: Insufficient documentation

## 2013-01-11 DIAGNOSIS — M79609 Pain in unspecified limb: Secondary | ICD-10-CM | POA: Insufficient documentation

## 2013-01-11 DIAGNOSIS — G8929 Other chronic pain: Secondary | ICD-10-CM

## 2013-01-11 LAB — CBC WITH DIFFERENTIAL/PLATELET
Basophils Absolute: 0 10*3/uL (ref 0.0–0.1)
Basophils Relative: 0 % (ref 0–1)
Eosinophils Absolute: 0.4 10*3/uL (ref 0.0–0.7)
Eosinophils Relative: 3 % (ref 0–5)
HCT: 38.9 % (ref 36.0–46.0)
MCH: 28.5 pg (ref 26.0–34.0)
MCHC: 33.7 g/dL (ref 30.0–36.0)
Monocytes Absolute: 0.8 10*3/uL (ref 0.1–1.0)
Neutro Abs: 7.2 10*3/uL (ref 1.7–7.7)
RDW: 14.6 % (ref 11.5–15.5)

## 2013-01-11 LAB — POCT URINALYSIS DIPSTICK
Ketones, UA: NEGATIVE
Nitrite, UA: NEGATIVE
Protein, UA: NEGATIVE
pH, UA: 5.5

## 2013-01-11 LAB — POCT UA - MICROSCOPIC ONLY

## 2013-01-11 LAB — COMPREHENSIVE METABOLIC PANEL
AST: 15 U/L (ref 0–37)
Alkaline Phosphatase: 108 U/L (ref 39–117)
BUN: 15 mg/dL (ref 6–23)
Creat: 0.85 mg/dL (ref 0.50–1.10)
Potassium: 3.9 mEq/L (ref 3.5–5.3)
Total Bilirubin: 0.2 mg/dL — ABNORMAL LOW (ref 0.3–1.2)

## 2013-01-11 MED ORDER — HYDROCODONE-ACETAMINOPHEN 10-325 MG PO TABS: 1.0000 | ORAL_TABLET | Freq: Four times a day (QID) | ORAL | Status: DC | PRN

## 2013-01-11 MED ORDER — AMOXICILLIN-POT CLAVULANATE 875-125 MG PO TABS: 1.0000 | ORAL_TABLET | Freq: Two times a day (BID) | ORAL | Status: DC

## 2013-01-11 NOTE — Assessment & Plan Note (Signed)
Worsened lately with worsened pain.  Feel the two are tied together. Will continue current depression medications in setting of pain being treated.

## 2013-01-11 NOTE — Patient Instructions (Signed)

## 2013-01-11 NOTE — Assessment & Plan Note (Signed)
With increased seizure activity, will treat for bacterial infection.

## 2013-01-11 NOTE — Patient Instructions (Signed)
I am sorry you have been having more seizures lately.  I am checking for any infection.  I will treat you for a sinus infection.    I will send you a letter with your lab results, or call you if anything is abnormal.    Please come back and see me in 1 month so I can see how you are doing.

## 2013-01-11 NOTE — Telephone Encounter (Signed)
Patient scheduled to come in for office visit for evaluation.

## 2013-01-11 NOTE — Progress Notes (Signed)
  Subjective:    Patient ID: Madison [name], female    DOB: [birth year], 37 y.o.   MRN: [id number]  HPI:  Madison [name] comes in for follow up.  She has not been doing well.    Seizure disorder: she has been having more seizures lately.  Dr Modesto Charon, her neurologist, is concerned she "might be infected" because of the increased seizures.  He has increased her zonisamide to 700 mg at bedtime.  She complains of chronic sinusitis, but denies fevers, chills, dyspnea, dysuria.    Chronic pain: had radiofrequency neurotomy on right side in December.  She had terrible pain after this.  She says she hurt all over, her finger nails hurt, her hair hurt, she could barely get out of bed some days.  She says she felt awful for about month but has started getting better.  Her radicular back problems have improved.  She says that Dr. Wynn Banker is going to do the other side.  She wants to get better, but is afraid of the terrible pain again.   Depression: she was feeling very down, which was complicated by her pain.  She continues to take her cymbalta and elavil.  She endorses hypersomina, depressed mood, increased tearfulness.  She denies SI/HI  Depression  Past Medical History  Diagnosis Date  . Tobacco user   . Grand mal seizure   . Fibromyalgia   . Seizures   . Breast CA   . GERD (gastroesophageal reflux disease)   . Depression   . Arthritis   . Allergy   . Hypertension     History  Substance Use Topics  . Smoking status: Current Every Day Smoker -- 1.00 packs/day    Types: Cigarettes  . Smokeless tobacco: Never Used     Comment: 1 ppd for 20 years   . Alcohol Use: Yes     Comment: minimal     No family history on file.   ROS Pertinent items in HPI    Objective:  Physical Exam:  BP 144/64  Pulse 93  Ht 5' 4.5" (1.638 m)  Wt 249 lb (112.946 kg)  BMI 42.1 kg/m2 General appearance: alert, cooperative and no distress Head: Normocephalic, without obvious abnormality, atraumatic, +sinuses  tender to palpation Ears: Normal TM's and canals bilaterally Nose: +congestion Lungs: clear to auscultation bilaterally Heart: regular rate and rhythm, S1, S2 normal, no murmur, click, rub or gallop Pulses: 2+ and symmetric       Assessment & Plan:

## 2013-01-11 NOTE — Progress Notes (Signed)
Subjective:    Patient ID: Madison [name], female    DOB: 10-20-1976, 37 y.o.   MRN: [id number]  HPI 2 seizurs since last visit.  Neurology adjusting medications. PCP visit to rule out any underlying infection Radiofrequency neurotomy seems to be helping low back pain. Had prolonged recovery time.Started feeling better at the end of January. L>R pain Right trapezius trigger point injection was helpful performed 12/13/2012 Pain Inventory Average Pain 7 Pain Right Now 7 My pain is sharp, burning and aching  In the last 24 hours, has pain interfered with the following? General activity 9 Relation with others 9 Enjoyment of life 10 What TIME of day is your pain at its worst? varies Sleep (in general) Poor  Pain is worse with: walking, bending, sitting, standing and some activites Pain improves with: rest and medication Relief from Meds: 5  Mobility use a cane  Function I need assistance with the following:  meal prep, household duties and shopping  Neuro/Psych weakness numbness tremor tingling trouble walking spasms depression anxiety  Prior Studies Any changes since last visit?  no  Physicians involved in your care Any changes since last visit?  no   History reviewed. No pertinent family history. History   Social History  . Marital Status: Legally Separated    Spouse Name: N/A    Number of Children: N/A  . Years of Education: N/A   Social History Main Topics  . Smoking status: Current Every Day Smoker -- 1.00 packs/day    Types: Cigarettes  . Smokeless tobacco: Never Used     Comment: 1 ppd for 20 years   . Alcohol Use: Yes     Comment: minimal   . Drug Use: None  . Sexually Active: None   Other Topics Concern  . None   Social History Narrative   Living w parents in [place] due to cancer and inability to take car for herself or others. Has 3 children. 88 yo has bipolar and MR, currently in foster home. Other 2 live with her. Divorced.    Past  Surgical History  Procedure Laterality Date  . Appendectomy    . Tonsillectomy    . Tubal ligation    . Mastectomy      bilateral  . Cholecystectomy  March 2011  . Abdominal hysterectomy  March 2012  . Breast surgery      bilateral mastectomy  . Port-a-cath insertion     Past Medical History  Diagnosis Date  . Tobacco user   . Grand mal seizure   . Fibromyalgia   . Seizures   . Breast CA   . GERD (gastroesophageal reflux disease)   . Depression   . Arthritis   . Allergy   . Hypertension    BP 157/75  Pulse 92  Resp 14  Ht 5\' 5"  (1.651 m)  Wt 249 lb 9.6 oz (113.218 kg)  BMI 41.54 kg/m2  SpO2 96%   Review of Systems  Musculoskeletal: Positive for back pain and gait problem.       Spasms  Neurological: Positive for tremors, seizures, weakness and numbness.       Tingling  Psychiatric/Behavioral: Positive for dysphoric mood. The patient is nervous/anxious.   All other systems reviewed and are negative.       Objective:   Physical Exam  Head: Normocephalic and atraumatic.  Neck: Normal range of motion.  Musculoskeletal:  Right hip: She exhibits tenderness. She exhibits normal range of motion.  Left hip: She exhibits  tenderness. She exhibits normal range of motion.  Right knee: She exhibits normal range of motion, no swelling, no effusion, no deformity, no LCL laxity, normal patellar mobility and no MCL laxity. tenderness found. Medial joint line and patellar tendon tenderness noted.  Cervical back: She exhibits decreased range of motion and tenderness. She exhibits no swelling, no edema, no deformity and no spasm.  Tenderness at multiple 18/18 fibromyalgia tender points.  Neurological: She is alert and oriented to person, place, and time. She has normal strength. She displays no atrophy. A sensory deficit is present. She exhibits normal muscle tone. Gait abnormal.    Sensation reduced in a glove and stocking pattern.  Psychiatric: Her speech is normal and  behavior is normal. Judgment and thought content normal. Her mood appears anxious. Cognition and memory are normal.        Assessment & Plan:  1. Fibromyalgia syndrome this is accounting for a lot of the soreness in the muscles in the neck back limbs.recommend Regular aerobic activity which the patient really has not been doing. 2. Back pain Still may have some soreness from the radiofrequency procedure  At this point will  pursue Left RF 3. Chronic pain syndrome: She is a hearty on numerous pain medications. I do not think adding another will be of any benefit. Our clinic will focus on injections as well as physical therapy.I discussed this with the patient and she agrees with my plan.

## 2013-01-11 NOTE — Assessment & Plan Note (Signed)
Description of pain consistent with nerve dysfunction.  However, will rx for hydrocodone so that she can get through the radiofrequency neurotomy so she might have more long term benefit.

## 2013-01-11 NOTE — Assessment & Plan Note (Signed)
With increased seizure activity lately.  Will check UA, CBC, CMET to look for causes.

## 2013-01-11 NOTE — Addendum Note (Signed)
Addended by: Ardyth Gal on: 01/11/2013 05:06 PM   Modules accepted: Orders

## 2013-01-15 ENCOUNTER — Other Ambulatory Visit: Payer: Self-pay

## 2013-01-17 ENCOUNTER — Telehealth: Payer: Self-pay | Admitting: Family Medicine

## 2013-01-17 ENCOUNTER — Encounter: Payer: Self-pay | Admitting: Family Medicine

## 2013-01-17 MED ORDER — FLUCONAZOLE 150 MG PO TABS: 150.0000 mg | ORAL_TABLET | Freq: Once | ORAL | Status: DC

## 2013-01-17 NOTE — Telephone Encounter (Signed)
Pt informed and agreeable. Fleeger, Jessica Dawn  

## 2013-01-17 NOTE — Telephone Encounter (Signed)
Took Augmentin last week and tried the [facility] OTC - didn't work - wants to know if she can have [facility] [place]

## 2013-01-17 NOTE — Telephone Encounter (Signed)
Diflucan sent to pharmacy.

## 2013-01-20 ENCOUNTER — Encounter: Payer: Self-pay | Admitting: Family Medicine

## 2013-01-24 ENCOUNTER — Telehealth: Payer: Self-pay | Admitting: *Deleted

## 2013-01-24 NOTE — Telephone Encounter (Signed)
Called and spoke with patient to reschedule her appt.  She stated that no one had called her to cancel her appt. And she assumed she would show up tomorrow and see another provider.  Informed her that she is due also for a research appt. In May and if she was not having any problems she could wait and do everything at her May appt. Instead of seeing another provider now and then again in May.  She agreed but would like to do some research on the providers before making a decision.  She will call me back and let me know when she makes a decision.

## 2013-01-25 ENCOUNTER — Telehealth: Payer: Self-pay | Admitting: *Deleted

## 2013-01-25 NOTE — Telephone Encounter (Signed)
Received call from patient stating she has decided to go with Dr. Darnelle Catalan.  Confirmed appt. For 03/31/13 at 130pm with Dr. Darnelle Catalan.  She is also concerned that last time she was here her port did not give blood and she has this port specifically to have her blood drawn due to bilateral mastectomies. She has had to have blood drawn from her feet but her veins are terrible.  Informed her that I would flush her port this week and check for blood return and if no blood return can do some alteplase and see if that works.  Patient is willing to wait when alteplase instilled if needed.

## 2013-01-26 ENCOUNTER — Telehealth: Payer: Self-pay | Admitting: Family Medicine

## 2013-01-26 MED ORDER — NYSTATIN 100000 UNIT/ML MT SUSP: 500000.0000 [IU] | Freq: Four times a day (QID) | OROMUCOSAL | Status: DC

## 2013-01-26 MED ORDER — FLUCONAZOLE 100 MG PO TABS: 100.0000 mg | ORAL_TABLET | Freq: Every day | ORAL | Status: DC

## 2013-01-26 NOTE — Telephone Encounter (Signed)
After taking abx for sinus inf, patient developed a yeast inf. [facility] sent in Diflucan but patient is still having symptoms of yeast inf. Needs to know whether she needs to be seen or can another script be called in for yeast inf.

## 2013-01-26 NOTE — Telephone Encounter (Signed)
Will forward message to Dr. Lula Olszewski

## 2013-01-26 NOTE — Telephone Encounter (Signed)
Diclucan 100 mg PO x5 days plus nystatin mouth swish sent to pharmacy.  Please notify patient.

## 2013-01-26 NOTE — Telephone Encounter (Signed)
LMOVM informing pt. Madison [name]  

## 2013-01-27 ENCOUNTER — Encounter: Payer: Self-pay | Admitting: Physical Medicine & Rehabilitation

## 2013-01-27 ENCOUNTER — Ambulatory Visit (HOSPITAL_BASED_OUTPATIENT_CLINIC_OR_DEPARTMENT_OTHER): Payer: Medicare Other | Admitting: Physical Medicine & Rehabilitation

## 2013-01-27 ENCOUNTER — Ambulatory Visit (HOSPITAL_BASED_OUTPATIENT_CLINIC_OR_DEPARTMENT_OTHER): Payer: Medicare Other

## 2013-01-27 VITALS — BP 145/96 | HR 91 | Resp 14 | Ht 64.5 in | Wt 247.2 lb

## 2013-01-27 DIAGNOSIS — C50019 Malignant neoplasm of nipple and areola, unspecified female breast: Secondary | ICD-10-CM

## 2013-01-27 DIAGNOSIS — M47817 Spondylosis without myelopathy or radiculopathy, lumbosacral region: Secondary | ICD-10-CM

## 2013-01-27 DIAGNOSIS — Z452 Encounter for adjustment and management of vascular access device: Secondary | ICD-10-CM

## 2013-01-27 NOTE — Patient Instructions (Signed)
Radiofrequency Lesioning Care After Refer to this sheet in the next few weeks. These instructions provide you with information on caring for yourself after your procedure. Your caregiver may also give you more specific instructions. Your treatment has been planned according to current medical practices, but problems sometimes occur. Call your caregiver if you have any problems or questions after your procedure. HOME CARE INSTRUCTIONS  Take pain medicine as directed by your caregiver.  You may have pain from the burned nerve for a while after the procedure. This takes time to heal. Ask your caregiver how long you should expect pain.  You should be able to return to your normal activities a couple of days after the procedure. When you can go back to work will depend on the type of work you do. Discuss this with your caregiver.  Pay close attention to how you feel after the procedure. If you start having pain, write down when it hurts and how it feels. This will help you and your caregiver know if you need another treatment. SEEK MEDICAL CARE IF:  The site where the needle was inserted for the procedure becomes red, swollen, or tender to touch.  Your pain does not get better. SEEK IMMEDIATE MEDICAL CARE IF:  You develop sudden, severe pain.  You develop numbness or tingling near the procedure site.  You have a fever. MAKE SURE YOU:  Understand these instructions.  Will watch your condition.  Will get help right away if you are not doing well or get worse. Document Released: 07/16/2011 Document Revised: 02/09/2012 Document Reviewed: 07/16/2011 Exit[employer] Patient Information 2013 [employer].  

## 2013-01-27 NOTE — Progress Notes (Signed)
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  Informed consent was obtained after describing risks and benefits of the procedure with the patient, this includes bleeding, bruising, infection, paralysis and medication side effects. The patient wishes to proceed and has given written consent. The patient was placed in a prone position. The lumbar and sacral area was marked and prepped with Betadine. A 25-gauge 1-1/2 inch needle was inserted into the skin and subcutaneous tissue at 3 sites in one ML of 1% lidocaine was injected into each site. Then a 20-gauge 15 cm radio frequency needle with a 1 cm curved active tip was inserted targeting the left S1 SAP/sacral ala junction. Bone contact was made and confirmed with lateral imaging. Sensory stimulation at 50 Hz followed by motor stimulation at 2 Hz confirm proper needle location followed by injection of one ML of the solution containing one ML of 4 mg per mL dexamethasone and 3 mL of 1% MPF lidocaine. Then the left L5 SAP/transverse process junction was targeted. Bone contact was made and confirmed with lateral imaging. Sensory stimulation at 50 Hz followed by motor stimulation at 2 Hz confirm proper needle location followed by injection of one ML of the solution containing one ML of 4 mg per mL dexamethasone and 3 mL of 1% MPF lidocaine. Then the left L4 SAP/transverse process junction was targeted. Bone contact was made and confirmed with lateral imaging. Sensory stimulation at 50 Hz followed by motor stimulation at 2 Hz confirm proper needle location followed by injection of one ML of the solution containing one ML of 4 mg per mL dexamethasone and 3 mL of 1% MPF lidocaine. Radio frequency lesion being at 80C for 90 seconds was  performed. Needles were removed. Post procedure instructions and vital signs were performed. Patient tolerated procedure well. Followup appointment was given.  

## 2013-01-27 NOTE — Progress Notes (Signed)
  PROCEDURE RECORD [facility]   Name: GUADALUPE KEREKES DOB:August 20, 1976 MRN: [id number]  Date:01/27/2013  Physician: Claudette Laws, MD    Nurse/CMA: Shumaker RN  Allergies:  Allergies  Allergen Reactions  . Taxol (Paclitaxel)   . Tramadol   . Ultram (Tramadol Hcl)     Consent Signed: yes  Is patient diabetic? no  CBG today?   Pregnant: no LMP: No LMP recorded. Patient has had a hysterectomy. (age 37-55)  Anticoagulants: no Anti-inflammatory: no Antibiotics: no  Procedure: Left Lumbar Radiofrequency Neurotomy Position: Prone Start Time: 9:12 End Time: 9:38 Fluoro Time: 51 seconds  RN/CMA Haematologist RN    Time 8:42 9:44    BP 145/96  152/92    Pulse 91 84    Respirations 14 14    O2 Sat 96 97    S/S 6 6    Pain Level 6/10      D/C home with mother, patient A & O X 3, D/C instructions reviewed, and sits independently.

## 2013-01-28 ENCOUNTER — Ambulatory Visit: Payer: Medicaid Other | Admitting: Oncology

## 2013-01-28 ENCOUNTER — Other Ambulatory Visit: Payer: Medicaid Other | Admitting: Lab

## 2013-01-31 ENCOUNTER — Telehealth: Payer: Self-pay | Admitting: Emergency Medicine

## 2013-01-31 NOTE — Telephone Encounter (Signed)
Patient called with complaints of lump in her throat.   Advised to call PCP.

## 2013-02-06 ENCOUNTER — Other Ambulatory Visit: Payer: Self-pay | Admitting: Family Medicine

## 2013-02-07 ENCOUNTER — Encounter: Payer: Self-pay | Admitting: *Deleted

## 2013-02-07 ENCOUNTER — Encounter: Payer: Self-pay | Admitting: Oncology

## 2013-02-07 NOTE — Progress Notes (Signed)
Mailed letter & calendar to pt. 

## 2013-02-09 ENCOUNTER — Encounter: Payer: Self-pay | Admitting: Family Medicine

## 2013-02-09 ENCOUNTER — Ambulatory Visit (INDEPENDENT_AMBULATORY_CARE_PROVIDER_SITE_OTHER): Payer: Medicaid Other | Admitting: Family Medicine

## 2013-02-09 VITALS — BP 155/102 | HR 93 | Ht 64.5 in | Wt 249.0 lb

## 2013-02-09 MED ORDER — HYDROCODONE-ACETAMINOPHEN 10-325 MG PO TABS: 1.0000 | ORAL_TABLET | Freq: Four times a day (QID) | ORAL | Status: DC | PRN

## 2013-02-09 NOTE — Assessment & Plan Note (Signed)
-   congratulated patient on increased walking.   - Discussed dietary changes, encouraged to eat breakfast, 6 small meals per day, minimize white starches, increase veggies.

## 2013-02-09 NOTE — Patient Instructions (Signed)
It was good to see you.  Please use the Vicodin as needed for pain.   To help you work on improving your nutrition, remember the plate rule for each meal:  - 1/4 of the plate or less should be a whole grain starch (brown rice, whole grain pasta, wheat bread, etc.)  - 1/4 of the plate should be a lean source of protein (chicken, [place], fish, beans, egg whites).  - 1/2 the plate or more should be fruits and vegetables - the more vegetables the better!   Please continue your physical activity as much as you can.

## 2013-02-09 NOTE — Progress Notes (Signed)
  Subjective:    Patient ID: Madison [name], female    DOB: January 31, 1976, 37 y.o.   MRN: [id number]  HPI:  Madison [name] comes in for follow up.  She is in a lot of pain.    Chronic pain: She had the radio frequency ablation (Dr. Wynn Banker) on the right side on 2/27.  When she had the left side done she had a month of terrible pain (see previous note).  Dr. Modesto Charon increased her lyrica (taking 2 qhs) which helped.   HTN: denies chest pain, dyspnea, LE edema.  Is taking amlodipine, Capozide Propranolol daily.  She was upset at first BP reading.   Obesity: Walking as much as she can tolerate.  Very frustrated as she is also watching her diet, using a salad plate to help with portion control and avoiding junk food.  She has gained weight.   Past Medical History  Diagnosis Date  . Tobacco user   . Grand mal seizure   . Fibromyalgia   . Seizures   . Breast CA   . GERD (gastroesophageal reflux disease)   . Depression   . Arthritis   . Allergy   . Hypertension     History  Substance Use Topics  . Smoking status: Current Every Day Smoker -- 1.00 packs/day    Types: Cigarettes  . Smokeless tobacco: Never Used     Comment: 1 ppd for 20 years   . Alcohol Use: Yes     Comment: minimal     No family history on file.   ROS Pertinent items in HPI    Objective:  Physical Exam:  BP 155/102  Pulse 93  Ht 5' 4.5" (1.638 m)  Wt 249 lb (112.946 kg)  BMI 42.1 kg/m2 Re-check BP with Manual cuff: 128/65 General appearance: alert, cooperative and no distress, obese Head: Normocephalic, without obvious abnormality, atraumatic Lungs: clear to auscultation bilaterally Heart: regular rate and rhythm, S1, S2 normal, no murmur, click, rub or gallop Pulses: 2+ and symmetric       Assessment & Plan:

## 2013-02-09 NOTE — Assessment & Plan Note (Signed)
Continues to have chronic daily pain, some days worse than others.  Is actively pursing non-narcotic treatments with Dr. Larna Daughters.  I greatly appreciate their care of this patient.  I will refill her Vicodin today to be used for severe pain.

## 2013-02-09 NOTE — Assessment & Plan Note (Signed)
Second BP reading manually was 128/65- pt is no BP check in arms, must check in leg, unclear if automatic BP is accurate.  At this time I will continue monitoring and current therapy. CMET last visit WNL.

## 2013-02-15 ENCOUNTER — Other Ambulatory Visit: Payer: Self-pay | Admitting: Family Medicine

## 2013-02-18 ENCOUNTER — Other Ambulatory Visit: Payer: Self-pay | Admitting: Family Medicine

## 2013-02-22 ENCOUNTER — Ambulatory Visit (HOSPITAL_BASED_OUTPATIENT_CLINIC_OR_DEPARTMENT_OTHER): Payer: Medicaid Other | Admitting: Physical Medicine & Rehabilitation

## 2013-02-22 ENCOUNTER — Encounter: Payer: Medicaid Other | Attending: Physical Medicine & Rehabilitation

## 2013-02-22 ENCOUNTER — Encounter: Payer: Self-pay | Admitting: Physical Medicine & Rehabilitation

## 2013-02-22 VITALS — BP 169/107 | HR 84 | Resp 14 | Ht 64.5 in | Wt 252.8 lb

## 2013-02-22 DIAGNOSIS — Z79899 Other long term (current) drug therapy: Secondary | ICD-10-CM | POA: Insufficient documentation

## 2013-02-22 DIAGNOSIS — IMO0001 Reserved for inherently not codable concepts without codable children: Secondary | ICD-10-CM

## 2013-02-22 DIAGNOSIS — IMO0002 Reserved for concepts with insufficient information to code with codable children: Secondary | ICD-10-CM

## 2013-02-22 DIAGNOSIS — M25559 Pain in unspecified hip: Secondary | ICD-10-CM | POA: Insufficient documentation

## 2013-02-22 DIAGNOSIS — G620 Drug-induced polyneuropathy: Secondary | ICD-10-CM | POA: Insufficient documentation

## 2013-02-22 DIAGNOSIS — M79609 Pain in unspecified limb: Secondary | ICD-10-CM | POA: Insufficient documentation

## 2013-02-22 DIAGNOSIS — M7551 Bursitis of right shoulder: Secondary | ICD-10-CM

## 2013-02-22 DIAGNOSIS — M25569 Pain in unspecified knee: Secondary | ICD-10-CM | POA: Insufficient documentation

## 2013-02-22 DIAGNOSIS — M47817 Spondylosis without myelopathy or radiculopathy, lumbosacral region: Secondary | ICD-10-CM | POA: Insufficient documentation

## 2013-02-22 DIAGNOSIS — M549 Dorsalgia, unspecified: Secondary | ICD-10-CM | POA: Insufficient documentation

## 2013-02-22 DIAGNOSIS — T451X5A Adverse effect of antineoplastic and immunosuppressive drugs, initial encounter: Secondary | ICD-10-CM | POA: Insufficient documentation

## 2013-02-22 DIAGNOSIS — R209 Unspecified disturbances of skin sensation: Secondary | ICD-10-CM | POA: Insufficient documentation

## 2013-02-22 DIAGNOSIS — M75100 Unspecified rotator cuff tear or rupture of unspecified shoulder, not specified as traumatic: Secondary | ICD-10-CM

## 2013-02-22 DIAGNOSIS — G894 Chronic pain syndrome: Secondary | ICD-10-CM | POA: Insufficient documentation

## 2013-02-22 DIAGNOSIS — M76899 Other specified enthesopathies of unspecified lower limb, excluding foot: Secondary | ICD-10-CM | POA: Insufficient documentation

## 2013-02-22 DIAGNOSIS — M542 Cervicalgia: Secondary | ICD-10-CM | POA: Insufficient documentation

## 2013-02-22 NOTE — Progress Notes (Signed)
Subjective:    Patient ID: Madison [name], female    DOB: 1976/06/23, 37 y.o.   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 General activity 7 Relation with others 7 Enjoyment of life 7 What TIME of day is your pain at its worst? all Sleep (in general) Poor  Pain is worse with: inactivity Pain improves with: heat/ice Relief from Meds: 7  Mobility walk without assistance walk with assistance use a cane ability to climb steps?  yes do you drive?  yes  Function disabled: date disabled open case  Neuro/Psych weakness tremor tingling spasms dizziness depression anxiety  Prior Studies Any changes since last visit?  no  Physicians involved in your care Any changes since last visit?  no   History reviewed. No pertinent family history. History   Social History  . Marital Status: Legally Separated    Spouse Name: N/A    Number of Children: N/A  . Years of Education: N/A   Social History Main Topics  . Smoking status: Current Every Day Smoker -- 1.00 packs/day    Types: Cigarettes  . Smokeless tobacco: Never Used     Comment: 1 ppd for 20 years   . Alcohol Use: Yes     Comment: minimal   . Drug Use: None  . Sexually Active: None   Other Topics Concern  . None   Social History Narrative   Living w parents in [place] due to cancer and inability to take car for herself or others. Has 3 children. 50 yo has bipolar and MR, currently in foster home. Other 2 live with her. Divorced.    Past Surgical History  Procedure Laterality Date  . Appendectomy    . Tonsillectomy    . Tubal ligation    . Mastectomy      bilateral  . Cholecystectomy  March 2011  . Abdominal  hysterectomy  March 2012  . Breast surgery      bilateral mastectomy  . Port-a-cath insertion     Past Medical History  Diagnosis Date  . Tobacco user   . Grand mal seizure   . Fibromyalgia   . Seizures   . Breast CA   . GERD (gastroesophageal reflux disease)   . Depression   . Arthritis   . Allergy   . Hypertension    BP 169/107  Pulse 84  Resp 14  Ht 5' 4.5" (1.638 m)  Wt 252 lb 12.8 oz (114.669 kg)  BMI 42.74 kg/m2  SpO2 95%    Review of Systems  Constitutional: Positive for diaphoresis and unexpected weight change.  Cardiovascular: Positive for leg swelling.  Gastrointestinal: Positive for constipation.  Musculoskeletal: Positive for back pain and gait problem.       Spasms  Neurological: Positive for dizziness, tremors and weakness.       Tingling  Psychiatric/Behavioral: Positive for dysphoric mood. The patient is nervous/anxious.   All other systems reviewed and are negative.       Objective:   Physical Exam  Positive impingement sign right shoulder. Pain over right greater trochanter however she has diffuse tenderness along the thoracic and lumbar paraspinal muscles. Reduced lumbar range of motion. Lower extremity strength  is normal.      Assessment & Plan:  1. Fibromyalgia syndrome this is accounting for a lot of the soreness in the muscles in the neck back limbs.recommend Regular aerobic activity which the patient really has not been doing. 2. Back pain Still may have some soreness from the radiofrequency procedure  Exacerbation of pain after seizure 3. Chronic pain syndrome: She is on numerous pain medications. I do not think adding another will be of any benefit. Our clinic will focus on injections as well as physical therapy.I discussed this with the patient and she agrees with my plan.  4. Right subacromial bursitis we'll inject today 5.  Recurrent right trochanteric bursitis we'll inject next visit in 3 weeks  Right subacromial bursa  injection Indication right subacromial bursitis after fall Informed consent was obtained after describing risks and benefits of the procedure with the patient these include bleeding bruising and infection she elects to proceed and has given written consent Patient placed in a seated position area marked and prepped with Betadine posterior lateral approach. 1 cc of 40 mg Depo-Medrol and 4 cc 1% lidocaine injected. Patient tolerated procedure well. Post procedure instructions

## 2013-02-23 ENCOUNTER — Telehealth: Payer: Self-pay | Admitting: *Deleted

## 2013-02-23 NOTE — Telephone Encounter (Signed)
PA required for Nasonex. Form placed in MD box.

## 2013-02-23 NOTE — Telephone Encounter (Signed)
Approval received from [facility] for Nasonex.  PA # [id number] W. Pharmacy notified.

## 2013-03-14 ENCOUNTER — Encounter: Payer: Self-pay | Admitting: Family Medicine

## 2013-03-14 ENCOUNTER — Ambulatory Visit (INDEPENDENT_AMBULATORY_CARE_PROVIDER_SITE_OTHER): Payer: Medicaid Other | Admitting: Family Medicine

## 2013-03-14 VITALS — BP 134/77 | HR 87 | Temp 98.7°F | Ht 64.5 in | Wt 249.0 lb

## 2013-03-14 DIAGNOSIS — F17200 Nicotine dependence, unspecified, uncomplicated: Secondary | ICD-10-CM

## 2013-03-14 DIAGNOSIS — R499 Unspecified voice and resonance disorder: Secondary | ICD-10-CM

## 2013-03-14 DIAGNOSIS — I159 Secondary hypertension, unspecified: Secondary | ICD-10-CM

## 2013-03-14 DIAGNOSIS — R498 Other voice and resonance disorders: Secondary | ICD-10-CM

## 2013-03-14 DIAGNOSIS — E669 Obesity, unspecified: Secondary | ICD-10-CM

## 2013-03-14 DIAGNOSIS — R569 Unspecified convulsions: Secondary | ICD-10-CM

## 2013-03-14 DIAGNOSIS — I158 Other secondary hypertension: Secondary | ICD-10-CM

## 2013-03-14 NOTE — Progress Notes (Signed)
  Subjective:    Patient ID: Madison [name], female    DOB: 12/03/1975, 37 y.o.   MRN: [id number]  HPI:  [name] comes in for follow up.   Seizures: She has had two seizures since last visit.  Dr. Modesto Charon [facility]) has added Lamictal to her medication regimen.  She says all her medications make her feel sleepy.  She also notes that her memory is not very good and she forgets things a lot- she says it gets worse with each seizure.   Throat discomfort: She complains of fullness in her throat, difficulty swallowing, a hoarse voice.  She says she coughs up mucous that his dark brown or sometimes has blood in it. She denies any GERD symptoms or sour taste in her mouth.  She is taking her Nexium.  Denies fevers chills, nasal congestion.   HTN: Taking Amlodipine, Capozide without difficulty.  Denies chest pain, dizziness, palpitations, LE edema.  Patient does not check blood pressures.    Past Medical History  Diagnosis Date  . Tobacco user   . Grand mal seizure   . Fibromyalgia   . Seizures   . Breast CA   . GERD (gastroesophageal reflux disease)   . Depression   . Arthritis   . Allergy   . Hypertension     History  Substance Use Topics  . Smoking status: Current Every Day Smoker -- 1.00 packs/day    Types: Cigarettes  . Smokeless tobacco: Never Used     Comment: 1 ppd for 20 years   . Alcohol Use: Yes     Comment: minimal    ROS Pertinent items in HPI    Objective:  Physical Exam:  BP 134/77  Pulse 87  Temp(Src) 98.7 F (37.1 C) (Oral)  Ht 5' 4.5" (1.638 m)  Wt 249 lb (112.946 kg)  BMI 42.1 kg/m2 General appearance: alert, cooperative and no distress Head: Normocephalic, without obvious abnormality, atraumatic ENT: TM's normal, nasal mucosa without discharge, Oral mucosa moist, s/p tonsillectomy no lesions Lungs: clear to auscultation bilaterally Heart: regular rate and rhythm, S1, S2 normal, no murmur, click, rub or gallop Pulses: 2+ and symmetric        Assessment & Plan:

## 2013-03-14 NOTE — Patient Instructions (Signed)
It was good to see you.  Your blood pressure today was BP: 134/77 mmHg.  Remember your goal blood pressure is about 130/80.  Please be sure to take your medication every day.    Please continue to try to be as active as possible.

## 2013-03-14 NOTE — Assessment & Plan Note (Signed)
Exam normal but concerning in a smoker- will refer to ENT for evaluation.

## 2013-03-14 NOTE — Assessment & Plan Note (Signed)
Addition of lamictal to regimen- with complaints of memory.  She is addressing with her neurologist who manages antiepileptics.  Will continue to monitor.

## 2013-03-14 NOTE — Assessment & Plan Note (Signed)
No progress, discussed increasing exercise to help with weight, mood,chronic pain.  She is walking as much as she can, continue to encourage her.

## 2013-03-14 NOTE — Assessment & Plan Note (Signed)
Improved control today- continue medications at current dose.

## 2013-03-19 ENCOUNTER — Other Ambulatory Visit: Payer: Self-pay | Admitting: Family Medicine

## 2013-03-22 ENCOUNTER — Encounter: Payer: Medicaid Other | Attending: Physical Medicine & Rehabilitation

## 2013-03-22 ENCOUNTER — Ambulatory Visit: Payer: Medicaid Other | Admitting: Physical Medicine & Rehabilitation

## 2013-03-31 ENCOUNTER — Telehealth: Payer: Self-pay | Admitting: *Deleted

## 2013-03-31 ENCOUNTER — Other Ambulatory Visit: Payer: Self-pay | Admitting: *Deleted

## 2013-03-31 ENCOUNTER — Ambulatory Visit (HOSPITAL_BASED_OUTPATIENT_CLINIC_OR_DEPARTMENT_OTHER): Payer: Medicaid Other | Admitting: Oncology

## 2013-03-31 VITALS — BP 150/99 | HR 87 | Temp 97.8°F | Resp 20 | Ht 64.0 in | Wt 255.7 lb

## 2013-03-31 DIAGNOSIS — C50919 Malignant neoplasm of unspecified site of unspecified female breast: Secondary | ICD-10-CM

## 2013-03-31 DIAGNOSIS — C50419 Malignant neoplasm of upper-outer quadrant of unspecified female breast: Secondary | ICD-10-CM

## 2013-03-31 MED ORDER — ANASTROZOLE 1 MG PO TABS: 1.0000 mg | ORAL_TABLET | Freq: Every day | ORAL | Status: DC

## 2013-03-31 NOTE — Telephone Encounter (Signed)
Lm gv appt for flush on 05/03/13 @3 :30pm..informed her that i will place her flush schedule in the mail...td

## 2013-03-31 NOTE — Telephone Encounter (Signed)
Made and printed appts for [facility] and her bone density. Pt had her flush completed today but when i got ready to schedule the other six she want 1 week and a couple of days before time. sw Val and she is going to enter another pof...td

## 2013-03-31 NOTE — Progress Notes (Signed)
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

## 2013-03-31 NOTE — Progress Notes (Signed)
Madison [name]   DOB: May 07, 1976  MR#: [id number]  NWG#:[id number]  PCP: Ardyth Gal, MD GYN: Osborn Coho SU: Cicero Duck OTHER MD: Lurline Hare, Cormac O'Donovan, Arvilla Meres   HISTORY OF PRESENT ILLNESS: From Dr. Doreatha Massed may 04/05/2009 notes:  "This woman is here today with her mother and mother's friend.  She apparently was noted to have a palpable mass in the left breast at about the 3 o'clock position back in October.  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  She had a 29-month followup mammogram.  In March of this year, she began experiencing pain in her breast.  She was seen in the ER at [facility] and subsequently was referred for a mammogram at the breast center.  She had a mammogram on 02/28/2009.  Physical exam at that time showed a palpable superficial subareolar mass extending from the 12 to 3 o'clock position measuring 2 cm.  On physical exam, ultrasound showed this to be about a 2.4 x 2 x 1.4 cm mass with mild posterior acoustic enhancement.  This was a lobular and hypoechoic mass felt to be consistent with a fibroadenoma but malignancy cannot be ruled out.  Of note is that the mass previously measured 1.7 x 1.4 x 1.1 cm back in October.  No abscess was seen.  No axillary adenopathy was seen.  The patient was referred to Dr. Jamey Ripa.  Because of the feeling this represented a fibroadenoma, an excision was planned and performed on 04/02/2009.  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  Margins were positive for tumor.  Lymphovascular invasion was not identified.  Prior to surgery and there was a concern about an additional lesion seen.  Additional breast tissue removed from the primary surgery showed the fibrocystic changes and intraductal papilloma.  No other evidence of malignancy.  This tumor was noted to be ER and PR positive.  Special  stains were also performed and apparently this was negative for chromogranin and minimal staining for synaptophysin.  Total dimensions of this mass could not be accurately assessed.  It was felt to be about 1.5 cm.  Because of some trabecular and organoid appearance of this lesion in some of the sections, special stains were performed and the tumor was felt to be high grade.  The patient has had an unremarkable postoperative course."  Herceptin second course is as detailed below.  INTERVAL HISTORY: Madison [name] returns today for followup of her breast cancer accompanied by her mother. She is establishing herself on my service today.  REVIEW OF SYSTEMS: She sleeps poorly, is chronically fatigued, has chronic pain in her back and hips as well as other joints, is concerned about weight gain, has chronic sinus problems, heartburn, history of urinary tract infections, and a history of seizures and headaches. She describes herself is forgetful, anxious, and depressed. She is not suicidal. A detailed review of systems was otherwise stable  PAST MEDICAL HISTORY: Past Medical History  Diagnosis Date  . Tobacco user   . Grand mal seizure   . Fibromyalgia   . Seizures   . Breast CA   . GERD (gastroesophageal reflux disease)   . Depression   . Arthritis   . Allergy   . Hypertension     PAST SURGICAL HISTORY: Past Surgical History  Procedure Laterality Date  . Appendectomy    . Tonsillectomy    . Tubal ligation    .  Mastectomy      bilateral  . Cholecystectomy  March 2011  . Abdominal hysterectomy  March 2012  . Breast surgery      bilateral mastectomy  . Port-a-cath insertion      FAMILY HISTORY No family history on file. Both parents alive, but divorced  Her mother is the Psychologist, occupational at [employer].  She has a sister and brother both who are alive and well in good health.  GYNECOLOGIC HISTORY: She is GX, P2, menarche age 89.  She underwent total abdominal hysterectomy  with bilateral salpingo-oophorectomy March of 2012  SOCIAL HISTORY: She is separated.  Originally from [place], went to [employer] and subsequently went to [employer]. She is disabled secondary to her fibromyalgia and seizure disorder..  Her daughter Madison [name] (pronounced "Irving Burton") is currently 68, and lives with the patient. Son Madison [name], 21, is a special needs child and lives in a group home.  ADVANCED DIRECTIVES: In place. The patient has a living will but requests no extraordinary support in case of a terminal event. The patient's healthcare power of attorney is her mother, Madison [name], cell number [phone]  HEALTH MAINTENANCE: History  Substance Use Topics  . Smoking status: Current Every Day Smoker -- 1.00 packs/day    Types: Cigarettes  . Smokeless tobacco: Never Used     Comment: 1 ppd for 20 years   . Alcohol Use: Yes     Comment: minimal      Colonoscopy:  PAP: s/p hysterectomy  Bone density: pending  Lipid panel:  Allergies  Allergen Reactions  . Taxol (Paclitaxel)   . Tramadol   . Ultram (Tramadol Hcl)     Current Outpatient Prescriptions  Medication Sig Dispense Refill  . amitriptyline (ELAVIL) 100 MG tablet TAKE 1 TABLET BY MOUTH AT BEDTIME  30 tablet  5  . amLODipine (NORVASC) 10 MG tablet TAKE 1 TABLET BY MOUTH ONCE A DAY  30 tablet  5  . anastrozole (ARIMIDEX) 1 MG tablet Take 1 tablet (1 mg total) by mouth daily.  30 tablet  12  . Artificial Saliva (AQUORAL) AERS Use as directed 2 sprays in the mouth or throat 3 (three) times daily.  40 mL  11  . captopril-hydrochlorothiazide (CAPOZIDE) 25-15 MG per tablet TAKE 1 TABLET BY MOUTH ONCE A DAY  30 tablet  3  . clonazePAM (KLONOPIN) 0.5 MG tablet Take 1 tablet (0.5 mg total) by mouth 2 (two) times daily.  60 tablet  3  . clonazePAM (KLONOPIN) 1 MG disintegrating tablet Take 2 tablets (2 mg total) by mouth 2 (two) times daily as needed.  60 tablet  3  . cyclobenzaprine (FLEXERIL) 10 MG tablet TAKE 1 TABLET BY  MOUTH TWICE A DAY AS NEEDED FOR MUSCLE SPASM  60 tablet  2  . CYMBALTA 60 MG capsule TAKE 1 CAPSULE BY MOUTH ONCE A DAY  30 capsule  11  . Diazepam (DIASTAT ACUDIAL) 20 MG GEL Place 20 mg rectally as needed (for seizures longer than 5 minutes or seizures clusters.).  2 each  3  . diclofenac sodium (VOLTAREN) 1 % GEL Apply 1 application topically 3 (three) times daily as needed (for joint pain).  100 g  3  . gabapentin (NEURONTIN) 600 MG tablet Take 1 tablet (600 mg total) by mouth 3 (three) times daily.  3 tablet  1  . HYDROcodone-acetaminophen (NORCO) 10-325 MG per tablet Take 1 tablet by mouth every 6 (six) hours as needed for pain.  90 tablet  1  . ibuprofen (ADVIL,MOTRIN) 800 MG tablet TAKE 1 TABLET BY MOUTH EVERY 8 HOURS IF NEEDED FOR PAIN. TAKE WITH FOOD.  180 tablet  1  . lamoTRIgine (LAMICTAL) 200 MG tablet Take 400 mg by mouth daily.      Marland Kitchen levETIRAcetam (KEPPRA) 1000 MG tablet Take 2,000 mg by mouth 2 (two) times daily. Rx by Dr. Inez Catalina, [facility]      . lidocaine (XYLOCAINE) 2 % solution Take 20 mLs by mouth as needed. Pt states she uses after a seizure.      . methocarbamol (ROBAXIN) 500 MG tablet TAKE 1 TABLET TWICE A DAY AS NEEDED FOR MUSCLE SPASM  60 tablet  3  . mometasone (NASONEX) 50 MCG/ACT nasal spray Place 2 sprays into the nose daily.  17 g  12  . nystatin (MYCOSTATIN) 100000 UNIT/ML suspension Take 5 mLs (500,000 Units total) by mouth 4 (four) times daily.  60 mL  0  . omeprazole (PRILOSEC) 20 MG capsule Take 20 mg by mouth daily.      . ondansetron (ZOFRAN) 4 MG tablet Take 4 mg by mouth every 8 (eight) hours as needed.      . pregabalin (LYRICA) 150 MG capsule Take 150 mg by mouth 2 (two) times daily. [facility]- one table in am, 2 in PM      . propranolol (INDERAL) 20 MG tablet Take 20 mg by mouth 2 (two) times daily.      . ranitidine (ZANTAC) 150 MG tablet Take 150 mg by mouth as needed.       . zonisamide (ZONEGRAN) 100 MG capsule take 7 tablets at night.       No  current facility-administered medications for this visit.   Facility-Administered Medications Ordered in Other Visits  Medication Dose Route Frequency Provider Last Rate Last Dose  . heparin lock flush 100 unit/mL  500 Units Intravenous Once Pierce Crane, MD      . sodium chloride 0.9 % injection 10 mL  10 mL Intravenous PRN Pierce Crane, MD        OBJECTIVE: Young white woman in no acute distress Filed Vitals:   03/31/13 1324  BP: 150/99  Pulse: 87  Temp: 97.8 F (36.6 C)  Resp: 20     Body mass index is 43.87 kg/(m^2).    ECOG FS: 2  Sclerae unicteric Oropharynx clear No cervical or supraclavicular adenopathy Lungs no rales or rhonchi Heart regular rate and rhythm Abd obese, benign MSK diffuse spinal tenderness Neuro: nonfocal, well oriented Breasts: Status post bilateral mastectomies. No evidence of local recurrence. Both axillae are benign   LAB RESULTS: Lab Results  Component Value Date   WBC 10.8* 01/11/2013   NEUTROABS 7.2 01/11/2013   HGB 13.1 01/11/2013   HCT 38.9 01/11/2013   MCV 84.7 01/11/2013   PLT 319 01/11/2013      Chemistry      Component Value Date/Time   NA 138 01/11/2013 1440   NA 139 07/27/2012 1300   K 3.9 01/11/2013 1440   K 3.6 07/27/2012 1300   CL 106 01/11/2013 1440   CL 105 07/27/2012 1300   CO2 23 01/11/2013 1440   CO2 23 07/27/2012 1300   BUN 15 01/11/2013 1440   BUN 8.0 07/27/2012 1300   CREATININE 0.85 01/11/2013 1440   CREATININE 0.8 07/27/2012 1300   CREATININE 0.83 01/15/2012 1331      Component Value Date/Time   CALCIUM 9.5 01/11/2013 1440   CALCIUM 9.8 07/27/2012 1300  ALKPHOS 108 01/11/2013 1440   ALKPHOS 126 07/27/2012 1300   AST 15 01/11/2013 1440   AST 23 07/27/2012 1300   ALT 19 01/11/2013 1440   ALT 27 07/27/2012 1300   BILITOT 0.2* 01/11/2013 1440   BILITOT 0.30 07/27/2012 1300       Lab Results  Component Value Date   LABCA2 23 07/27/2012    No components found with this basename: [id number]    No results found for this  basename: INR,  in the last 168 hours  Urinalysis    Component Value Date/Time   COLORURINE YELLOW 07/16/2011 1040   APPEARANCEUR CLOUDY* 07/16/2011 1040   LABSPEC 1.019 07/16/2011 1040   LABSPEC 1.015 08/01/2009 1035   PHURINE 6.0 07/16/2011 1040   GLUCOSEU NEGATIVE 07/16/2011 1040   HGBUR TRACE* 07/16/2011 1040   BILIRUBINUR NEG 01/11/2013 1411   BILIRUBINUR NEGATIVE 07/16/2011 1040   KETONESUR TRACE* 07/16/2011 1040   PROTEINUR NEGATIVE 07/16/2011 1040   UROBILINOGEN 0.2 01/11/2013 1411   UROBILINOGEN 0.2 07/16/2011 1040   NITRITE NEG 01/11/2013 1411   NITRITE NEGATIVE 07/16/2011 1040   LEUKOCYTESUR Trace 01/11/2013 1411    STUDIES: No results found.  ASSESSMENT: 37 y.o.  [place], [place] woman  (1) status post left lumpectomy 04/02/2009 for a pT1c NX invasive ductal carcinoma, grade 3, estrogen and progesterone receptor positive, HER-2 not amplified, with positive margins  (2) status post left modified radical mastectomy 04/09/2009 showing no residual tumor in the breast, but 1/26 lymph nodes involved, for final stage of pT1c pN1, stage IIA; [s/p subsequent Right mastectomy with benign pathology]  (3) treated adjuvantly according to the ECOG E5103 study, with 4 cycles of dose dense Adriamycin/Cytoxan, followed by 10 doses of weekly paclitaxel, all given with bevacizumab or placebo.  (4) completed postmastectomy radiation to the left chest wall and left supraclavicular fossa 12/07/2009  (5) received tamoxifen February 2011 to March 2012  (6) s/p TAH-BSO 02/17/2011 with benign pathology  (7) on anastrozole as of April 2012  (8) genetic testing to be scheduled through [facility]  (9) patient will benefit from psychiatric referral once she has completed her enrollment in Medicare  PLAN: Eduarda is tolerating the anastrozole well. We are going to obtain a bone density before the next visit here, which will serve as a baseline. The plan is to continue the anastrozole for a total of 10 years.  The  patient will be followed as per the E5103 protocol, namely every 6 months until she completes 5 years, and then yearly for 5 years. We will do lab work and physical exam at each visit. She was counseled regarding smoking cessation. She has not been able to obtain psychiatric support for her smoking cessation, because of the complexity of her medications, but we will try to refer her to psychiatry once she has Medicare in place.  Otherwise she knows to call us for any problems that may develop before the next visit.   MAGRINAT,GUSTAV C    03/31/2013

## 2013-04-01 ENCOUNTER — Encounter: Payer: Medicaid Other | Attending: Physical Medicine & Rehabilitation

## 2013-04-01 ENCOUNTER — Ambulatory Visit (HOSPITAL_BASED_OUTPATIENT_CLINIC_OR_DEPARTMENT_OTHER): Payer: Medicare Other | Admitting: Physical Medicine & Rehabilitation

## 2013-04-01 ENCOUNTER — Encounter: Payer: Self-pay | Admitting: Physical Medicine & Rehabilitation

## 2013-04-01 VITALS — BP 164/92 | HR 86 | Resp 14 | Ht 64.0 in | Wt 255.0 lb

## 2013-04-01 DIAGNOSIS — G894 Chronic pain syndrome: Secondary | ICD-10-CM | POA: Insufficient documentation

## 2013-04-01 DIAGNOSIS — IMO0002 Reserved for concepts with insufficient information to code with codable children: Secondary | ICD-10-CM

## 2013-04-01 DIAGNOSIS — IMO0001 Reserved for inherently not codable concepts without codable children: Secondary | ICD-10-CM | POA: Insufficient documentation

## 2013-04-01 DIAGNOSIS — T451X5A Adverse effect of antineoplastic and immunosuppressive drugs, initial encounter: Secondary | ICD-10-CM | POA: Insufficient documentation

## 2013-04-01 DIAGNOSIS — M47817 Spondylosis without myelopathy or radiculopathy, lumbosacral region: Secondary | ICD-10-CM | POA: Insufficient documentation

## 2013-04-01 DIAGNOSIS — M75100 Unspecified rotator cuff tear or rupture of unspecified shoulder, not specified as traumatic: Secondary | ICD-10-CM

## 2013-04-01 DIAGNOSIS — M7541 Impingement syndrome of right shoulder: Secondary | ICD-10-CM

## 2013-04-01 DIAGNOSIS — M79609 Pain in unspecified limb: Secondary | ICD-10-CM | POA: Insufficient documentation

## 2013-04-01 DIAGNOSIS — M76899 Other specified enthesopathies of unspecified lower limb, excluding foot: Secondary | ICD-10-CM | POA: Insufficient documentation

## 2013-04-01 DIAGNOSIS — M549 Dorsalgia, unspecified: Secondary | ICD-10-CM | POA: Insufficient documentation

## 2013-04-01 DIAGNOSIS — M25569 Pain in unspecified knee: Secondary | ICD-10-CM | POA: Insufficient documentation

## 2013-04-01 DIAGNOSIS — M542 Cervicalgia: Secondary | ICD-10-CM | POA: Insufficient documentation

## 2013-04-01 DIAGNOSIS — Z79899 Other long term (current) drug therapy: Secondary | ICD-10-CM | POA: Insufficient documentation

## 2013-04-01 DIAGNOSIS — M25512 Pain in left shoulder: Secondary | ICD-10-CM | POA: Insufficient documentation

## 2013-04-01 DIAGNOSIS — R209 Unspecified disturbances of skin sensation: Secondary | ICD-10-CM | POA: Insufficient documentation

## 2013-04-01 DIAGNOSIS — M25559 Pain in unspecified hip: Secondary | ICD-10-CM | POA: Insufficient documentation

## 2013-04-01 DIAGNOSIS — G620 Drug-induced polyneuropathy: Secondary | ICD-10-CM | POA: Insufficient documentation

## 2013-04-01 NOTE — Patient Instructions (Signed)
L hip injection next visit

## 2013-04-01 NOTE — Progress Notes (Signed)
Shoulder injectionR Subacromial   Indication:Right Shoulder pain not relieved by medication management and other conservative care.  Informed consent was obtained after describing risks and benefits of the procedure with the patient, this includes bleeding, bruising, infection and medication side effects. The patient wishes to proceed and has given written consent. Patient was placed in a seated position. TheRight shoulder was marked and prepped with betadine in the subacromial area. A 25-gauge 1-1/2 inch needle was inserted into the subacromial area. After negative draw back for blood, a solution containing 1 mL of 40 mg per ML depomedrol and 3 mL of 1% lidocaine was injected. A band aid was applied. The patient tolerated the procedure well. Post procedure instructions were given.

## 2013-04-04 ENCOUNTER — Other Ambulatory Visit: Payer: Self-pay | Admitting: Family Medicine

## 2013-04-05 ENCOUNTER — Other Ambulatory Visit: Payer: Self-pay | Admitting: *Deleted

## 2013-04-06 ENCOUNTER — Other Ambulatory Visit: Payer: Self-pay | Admitting: Family Medicine

## 2013-04-06 ENCOUNTER — Telehealth: Payer: Self-pay | Admitting: Family Medicine

## 2013-04-06 NOTE — Telephone Encounter (Signed)
Pt is asking for refill for her Vicodin 10-325  [facility] [place]   Pt just had a seizure event and she is in a lot of pain

## 2013-04-07 MED ORDER — HYDROCODONE-ACETAMINOPHEN 10-325 MG PO TABS: 1.0000 | ORAL_TABLET | Freq: Four times a day (QID) | ORAL | Status: DC | PRN

## 2013-04-07 NOTE — Telephone Encounter (Signed)
Rx printed for # 10 hydrocodone, [facility] [place].

## 2013-04-13 ENCOUNTER — Ambulatory Visit (INDEPENDENT_AMBULATORY_CARE_PROVIDER_SITE_OTHER): Payer: Medicaid Other | Admitting: Family Medicine

## 2013-04-13 ENCOUNTER — Encounter: Payer: Self-pay | Admitting: Family Medicine

## 2013-04-13 VITALS — BP 121/75 | HR 89 | Temp 98.1°F | Ht 64.0 in | Wt 253.0 lb

## 2013-04-13 DIAGNOSIS — F329 Major depressive disorder, single episode, unspecified: Secondary | ICD-10-CM

## 2013-04-13 DIAGNOSIS — G8929 Other chronic pain: Secondary | ICD-10-CM

## 2013-04-13 DIAGNOSIS — I158 Other secondary hypertension: Secondary | ICD-10-CM

## 2013-04-13 DIAGNOSIS — I159 Secondary hypertension, unspecified: Secondary | ICD-10-CM

## 2013-04-13 MED ORDER — HYDROCODONE-ACETAMINOPHEN 10-325 MG PO TABS: 1.0000 | ORAL_TABLET | Freq: Four times a day (QID) | ORAL | Status: DC | PRN

## 2013-04-13 NOTE — Patient Instructions (Signed)
It was good to see you.  Please try discontinuing your Amlodipine since it makes you nauseated, but check your blood pressures daily, and call if they are too high.   I am referring you to a different pain clinic.  In the mean time, please try to be as active as possible.

## 2013-04-13 NOTE — Assessment & Plan Note (Signed)
Very difficult patient, she has been seeing Dr. Wynn Banker, but today repots she does not feel like he has anything else to offer her, would like to try a different clinic.  Will make referral to [facility] to see if they will have other options.  She does not have significant financial resources, so alternative therapies that are not covered by medicaid are not available for her. Continue hydrocodone PRN for pain.

## 2013-04-13 NOTE — Assessment & Plan Note (Signed)
Weight gain is an issue, though do not think elavil is responsible for all the weight.  Patient does report significant improvement in mood with elavil, does not feel like it can be tapered at this point.  Continue cymbalta and elavil at current dose.

## 2013-04-13 NOTE — Assessment & Plan Note (Signed)
Improved control today, per patient request will have her d/c amlodipine and check BP's daily, may need to add it back or find another alternative if BP not well controlled.

## 2013-04-13 NOTE — Progress Notes (Signed)
  Subjective:    Patient ID: Madison [name], female    DOB: Sep 11, 1976, 37 y.o.   MRN: [id number]  HPI:  Madison [name] comes in for follow up.  She is not doing very well, she has had several seizures lately and has recently found out her 33 year old daughter is having sex which she is very concerned and upset about.   Depression: Taking cybalta, elavil.  Has gained quite a lot of weight over past year (nearly 30 lbs), but says that when her neurologist tried to taper her elavil because of weight gain she had terrible worsening in her mood.  No SI/HI, but often has depressed mood, which is closely related to her pain.   Chronic Pain/Fibromyalgia: She complains of daily pain, has bad days that it is difficult to move, and that are so bad that her finger nails and hair hurt.  She is taking lyrica, elavil, cymbalta at max doses.  She takes hydrocodone as needed for pain, but says she does not schedule it, so it varies how much she takes.  She says she takes 3-4 on "bad days."  She has been seeing Dr. Trecia Rogers at [facility], and he has tried cortisone injections, trigger point injections, and Lumbar Radiofrequency Neurotomy x 2.  She says these therapies have helped minimally, and that the cortisone makes her want to "eat the house."    HTN: Taking captopril, amlodipine, propranolol.   She complains that the amlodipine makes her nauseated and irritates her throat, she wonders if she might not need it.  No palpitations, exertional chest pain or dyspnea.   Past Medical History  Diagnosis Date  . Tobacco user   . Grand mal seizure   . Fibromyalgia   . Seizures   . Breast CA   . GERD (gastroesophageal reflux disease)   . Depression   . Arthritis   . Allergy   . Hypertension     History  Substance Use Topics  . Smoking status: Current Every Day Smoker -- 1.00 packs/day    Types: Cigarettes  . Smokeless tobacco: Never Used     Comment: 1 ppd for 20 years   . Alcohol Use: Yes   Comment: minimal     No family history on file.   ROS Pertinent items in HPI    Objective:  Physical Exam:  BP 121/75  Pulse 89  Temp(Src) 98.1 F (36.7 C) (Oral)  Ht 5\' 4"  (1.626 m)  Wt 253 lb (114.76 kg)  BMI 43.41 kg/m2 General appearance: alert, cooperative and no distress Head: Normocephalic, without obvious abnormality, atraumatic Lungs: clear to auscultation bilaterally Heart: regular rate and rhythm, S1, S2 normal, no murmur, click, rub or gallop Pulses: 2+ and symmetric Neuro: Diffuse pain in neck, back hips with tenderness to just mild palpation.        Assessment & Plan:

## 2013-04-18 ENCOUNTER — Ambulatory Visit: Payer: Medicaid Other | Admitting: Physical Medicine & Rehabilitation

## 2013-04-22 ENCOUNTER — Telehealth: Payer: Self-pay | Admitting: Family Medicine

## 2013-04-22 NOTE — Telephone Encounter (Signed)
Pt fell 3 feet yesterday and hit her head and went to [facility] - has a concussion and wanted to let her doctor know.  She is also going to call her neurologist.

## 2013-04-22 NOTE — Telephone Encounter (Signed)
Will fwd FYI to MD.  Radene Ou, CMA

## 2013-05-03 ENCOUNTER — Ambulatory Visit: Payer: Self-pay

## 2013-05-03 ENCOUNTER — Other Ambulatory Visit: Payer: Self-pay

## 2013-05-03 NOTE — Progress Notes (Signed)
No Show

## 2013-05-10 ENCOUNTER — Encounter: Payer: Self-pay | Admitting: Family Medicine

## 2013-05-10 ENCOUNTER — Ambulatory Visit (INDEPENDENT_AMBULATORY_CARE_PROVIDER_SITE_OTHER): Payer: Medicare Other | Admitting: Family Medicine

## 2013-05-10 VITALS — BP 138/82 | HR 97 | Temp 98.2°F | Wt 255.0 lb

## 2013-05-10 DIAGNOSIS — R0600 Dyspnea, unspecified: Secondary | ICD-10-CM

## 2013-05-10 DIAGNOSIS — M7541 Impingement syndrome of right shoulder: Secondary | ICD-10-CM

## 2013-05-10 DIAGNOSIS — I159 Secondary hypertension, unspecified: Secondary | ICD-10-CM

## 2013-05-10 DIAGNOSIS — I158 Other secondary hypertension: Secondary | ICD-10-CM

## 2013-05-10 DIAGNOSIS — R0609 Other forms of dyspnea: Secondary | ICD-10-CM

## 2013-05-10 DIAGNOSIS — M75100 Unspecified rotator cuff tear or rupture of unspecified shoulder, not specified as traumatic: Secondary | ICD-10-CM

## 2013-05-10 MED ORDER — METHYLPREDNISOLONE ACETATE 40 MG/ML IJ SUSP
40.0000 mg | Freq: Once | INTRAMUSCULAR | Status: AC
  Administered 2013-05-10: 40 mg via INTRA_ARTICULAR

## 2013-05-10 MED ORDER — MORPHINE SULFATE 10 MG/ML IJ SOLN
2.0000 mg | Freq: Once | INTRAMUSCULAR | Status: AC
  Administered 2013-05-10: 2 mg via INTRAMUSCULAR

## 2013-05-10 NOTE — Assessment & Plan Note (Signed)
Cortisone injection today.  Discussed risk of frozen shoulder if she restricts her shoulder movement.  Gave hand out on frozen shoulder with home exercise program.

## 2013-05-10 NOTE — Assessment & Plan Note (Signed)
Pulse ox 92% on room, air, report of "low sats" in [place] ER, and 20+ pack years.  Will have her scheduled for Spirometry.

## 2013-05-10 NOTE — Progress Notes (Signed)
  Subjective:    Patient ID: Madison [name], female    DOB: [birth year], 37 y.o.   MRN: [id number]  HPI:  [name] comes in for follow up.  She is not doing well.  She had a fall a few weeks ago and hit her head, went to the ER and had a CT scan done, only had external hematoma.  Also, she had a seizure in the shower a week ago and fell, hit her chin and hurt her L shoulder and neck.  She says it hurts to move her shoulder in any direction.  She says she wants an ace wrap to hold it to her body.  She says her pain is 10/10 today.  She is tearful.   HTN: secondary to Chemotherapy.  Taking Capozide.  Recently d/c'd Amlodipine due to side effects.  Denies chest pain.   Dyspnea: When she was in the ER they told her "her sats were low."  She endorses some occasional shortness of breath. She has smoked 1ppd for about 20 years.   Past Medical History  Diagnosis Date  . Tobacco user   . Grand mal seizure   . Fibromyalgia   . Seizures   . Breast CA   . GERD (gastroesophageal reflux disease)   . Depression   . Arthritis   . Allergy   . Hypertension     History  Substance Use Topics  . Smoking status: Current Every Day Smoker -- 1.00 packs/day    Types: Cigarettes  . Smokeless tobacco: Never Used     Comment: 1 ppd for 20 years   . Alcohol Use: Yes     Comment: minimal     No family history on file.   ROS Pertinent items in HPI    Objective:  Physical Exam:  BP 138/82  Pulse 97  Temp(Src) 98.2 F (36.8 C) (Oral)  Wt 255 lb (115.667 kg)  BMI 43.75 kg/m2  SpO2 92% General appearance: alert, cooperative, tearful Head: Normocephalic, without obvious abnormality, atraumatic Lungs: clear to auscultation bilaterally Heart: regular rate and rhythm, S1, S2 normal, no murmur, click, rub or gallop Pulses: 2+ and symmetric  Left Shoulder: +diffuse TTP ROM limited in all directions due to pain Rotator cuff strength appears in tact but pain limits exam + Hawkin's impingement sign.    Consent obtained and verified. Skin cleansed with alcohol. Topical analgesic spray: Ethyl chloride. Joint:Left shoulder Approached in typical fashion with:Posterior approach Completed without difficulty Meds:40 mg Depo medrol, 3 cc 1% Lidocaine Needle:21G 1 1/2 inch Aftercare instructions and Red flags advised.      Assessment & Plan:

## 2013-05-10 NOTE — Patient Instructions (Addendum)
It was good to see you. I am sorry you are in so much pain.  Please use ice and heat therapy on your neck and shoulder.  You need to move your shoulder or it might become frozen.  Please see the hand out with stretches and exercises.   Tonight you should ice your shoulder for at least 20 minutes after the injection.    Your blood pressure today was BP: 138/82 mmHg.  Remember your goal blood pressure is about 130/80.  Please be sure to take your medication every day.    Please make an appointment to meet your new Doctor in 1 month.

## 2013-05-10 NOTE — Assessment & Plan Note (Signed)
Relatively good control on Captopril-HCTZ.  Will continue at current dose.

## 2013-05-31 ENCOUNTER — Telehealth: Payer: Self-pay | Admitting: *Deleted

## 2013-05-31 NOTE — Telephone Encounter (Signed)
sw pt she cancel her flush appt for 06/14/13 and rs for 06/16/13 @ 1:30pm...td

## 2013-06-01 ENCOUNTER — Other Ambulatory Visit: Payer: Self-pay | Admitting: Family Medicine

## 2013-06-02 NOTE — Telephone Encounter (Signed)
Per chart review, it looks like patient is no longer taking this.

## 2013-06-06 ENCOUNTER — Ambulatory Visit
Admission: RE | Admit: 2013-06-06 | Discharge: 2013-06-06 | Disposition: A | Discharge status: Home / Self Care | Payer: Medicare Other | Source: Ambulatory Visit | Attending: Oncology | Admitting: Oncology

## 2013-06-06 ENCOUNTER — Other Ambulatory Visit: Payer: Self-pay | Admitting: Family Medicine

## 2013-06-06 ENCOUNTER — Ambulatory Visit: Payer: Medicaid Other | Admitting: Physical Medicine & Rehabilitation

## 2013-06-06 ENCOUNTER — Encounter: Payer: Self-pay | Admitting: Family Medicine

## 2013-06-06 DIAGNOSIS — C50919 Malignant neoplasm of unspecified site of unspecified female breast: Secondary | ICD-10-CM

## 2013-06-07 NOTE — Telephone Encounter (Signed)
As noted previously, per chart review, it seems patient is no longer taking this. If I am incorrect please clarify and I will prescribe.

## 2013-06-08 ENCOUNTER — Other Ambulatory Visit: Payer: Self-pay | Admitting: Family Medicine

## 2013-06-08 NOTE — Telephone Encounter (Signed)
This is the third time this is coming into my inbasket in about as many days, with no explanation. As I mentioned previously twice, per chart review, it seems patient is no longer taking this. If I am incorrect please clarify and I will prescribe, but otherwise please stop sending refill requests.

## 2013-06-08 NOTE — Addendum Note (Signed)
Addended by: [name] T on: 06/08/2013 10:26 PM   Modules accepted: Orders, Medications

## 2013-06-09 ENCOUNTER — Other Ambulatory Visit: Payer: Self-pay | Admitting: *Deleted

## 2013-06-09 MED ORDER — HYDROCODONE-ACETAMINOPHEN 10-325 MG PO TABS: 1.0000 | ORAL_TABLET | Freq: Four times a day (QID) | ORAL | Status: DC | PRN

## 2013-06-09 NOTE — Telephone Encounter (Signed)
Madison [name], please call and let patient know: Refill request on Norco received.  Per notes from Dr. Lula Olszewski (previous PCP), pt's chronic pain was stable on norco regimen of 10-325mg  3-4 tabs on bad days, 90 tabs monthly.   Patient is new to me and I have not met her. I would like to meet her but to get her to the next appointment, I will rx 90 tabs with 1 refill (2 month supply) - [facility] [address], [place], [place]. However, prior to more refills, patient needs appt with MD.  Leona Singleton, MD 06/09/2013 11:52 AM

## 2013-06-09 NOTE — Telephone Encounter (Signed)
Called pt and she has appointment to see you on the 17th and stated that was fine - will discuss med refills then! Wyatt Haste, RN-BSN

## 2013-06-13 ENCOUNTER — Other Ambulatory Visit: Payer: Self-pay | Admitting: Family Medicine

## 2013-06-14 NOTE — Telephone Encounter (Signed)
On chart review, pt is taking capozide and elavil daily. Will refill.

## 2013-06-16 ENCOUNTER — Encounter: Payer: Self-pay | Admitting: Family Medicine

## 2013-06-16 ENCOUNTER — Ambulatory Visit (INDEPENDENT_AMBULATORY_CARE_PROVIDER_SITE_OTHER): Payer: Medicare Other | Admitting: Family Medicine

## 2013-06-16 ENCOUNTER — Ambulatory Visit (HOSPITAL_BASED_OUTPATIENT_CLINIC_OR_DEPARTMENT_OTHER): Payer: Medicare Other

## 2013-06-16 VITALS — BP 160/97 | HR 79 | Temp 98.9°F | Resp 20

## 2013-06-16 VITALS — BP 132/63 | HR 86 | Temp 98.9°F | Ht 64.0 in | Wt 259.0 lb

## 2013-06-16 DIAGNOSIS — I158 Other secondary hypertension: Secondary | ICD-10-CM

## 2013-06-16 DIAGNOSIS — K219 Gastro-esophageal reflux disease without esophagitis: Secondary | ICD-10-CM | POA: Insufficient documentation

## 2013-06-16 DIAGNOSIS — I159 Secondary hypertension, unspecified: Secondary | ICD-10-CM

## 2013-06-16 DIAGNOSIS — E669 Obesity, unspecified: Secondary | ICD-10-CM

## 2013-06-16 DIAGNOSIS — C50919 Malignant neoplasm of unspecified site of unspecified female breast: Secondary | ICD-10-CM

## 2013-06-16 MED ORDER — ESOMEPRAZOLE MAGNESIUM 40 MG PO CPDR: 40.0000 mg | DELAYED_RELEASE_CAPSULE | Freq: Every day | ORAL | Status: DC

## 2013-06-16 MED ORDER — SODIUM CHLORIDE 0.9 % IJ SOLN
10.0000 mL | INTRAMUSCULAR | Status: DC | PRN
  Administered 2013-06-16: 10 mL via INTRAVENOUS
  Filled 2013-06-16: qty 10

## 2013-06-16 MED ORDER — HEPARIN SOD (PORK) LOCK FLUSH 100 UNIT/ML IV SOLN
500.0000 [IU] | Freq: Once | INTRAVENOUS | Status: AC
  Administered 2013-06-16: 500 [IU] via INTRAVENOUS
  Filled 2013-06-16: qty 5

## 2013-06-16 NOTE — Patient Instructions (Signed)
Call MD for problems or concerns 

## 2013-06-16 NOTE — Patient Instructions (Addendum)
It was great to meet you today!  We reviewed all your medications and the only thing I am adding is nexium.  For your desire to lose weight, I think that is a really good eventual goal. It would be good to come up with some more specific goals. I want you to call Dr. Gerilyn Pilgrim. I am giving you her card.  For other issues, I'd like to see you back for a general health maintenance visit where all we discuss is making sure you're up to date on screenings and labs.  As you leave, make an appointment to follow up with me when convenient for a health maintenance visit.  - Bring all medications in a bag to your visits.  Take care and seek immediate care if you develop any concerns.  Dr. [name]

## 2013-06-16 NOTE — Assessment & Plan Note (Signed)
Pt requesting nexium, states it is worse than it used to be and nexium used to help. - re-prescribed nexium 40mg  daily - F/u at next visit.

## 2013-06-16 NOTE — Assessment & Plan Note (Signed)
Currently relatively well-controlled on captopril-HCTZ and propranolol - Continue current management. - F/u in 2 months.

## 2013-06-16 NOTE — Assessment & Plan Note (Addendum)
Worsened, gained 10 lbs in 4 months. Pt upset by obesity and wants to lose weight (BMI 44.44). States she used to be 200 lbs. - Pt wants to lose weight and is anxious about it. - Dr Gerilyn Pilgrim' information provided, recommended pt call her and set up f/u with me if Dr Gerilyn Pilgrim has no appointment slots - Pt voiced understanding

## 2013-06-16 NOTE — Progress Notes (Signed)
Patient ID: Madison [name], female   DOB: August 20, 1976, 37 y.o.   MRN: [id number] Subjective:   CC: Review medications and meet new MD.  HPI: Madison [name] is a 37 y.o. female with here for medication review and to meet new MD.  1. F/u BP - Pt has been taking medications regularly. States BP increased to "very high" when she was on avastin and since being off of it in 2011, it has been getting slowly better. Denies side effects from antihypertensives. Reports she like BP checks q 3 months and had it checked 1 month ago.  2. Medication review: reviewed all pt's medications. She states our office prescribes everything except the following, prescribed by Dr. Modesto Charon neurology (klonopin, diazepam, keppra, lamictal, lyrica, inderal, zonisamide) and by oncology (anastrozole).   3. Bad heartburn - Pt has taken nexium 40mg  daily for this before and wants to try it again because it helped. Does not report hematemesis.  4. Weight gain - Pt becomes tearful describing how she would like to lose weight and feels her dry mouth and weight gain are medication side effects because cortisone shots from pain doctor make her hungry. She would very much like to discuss this.  Review of Systems - Per HPI.   PMH, FH, or SH - See med review - SH: Exercise: none. She feels limited here with her MSK issues. She feels she can make improvements to her diet.    Objective:  Physical Exam BP 132/63  Pulse 86  Temp(Src) 98.9 F (37.2 C) (Oral)  Ht 5\' 4"  (1.626 m)  Wt 259 lb (117.482 kg)  BMI 44.44 kg/m2 GEN: NAD, pleasant CV: RRR, no m/r/g PULM: CTAB, no wheezes or crackles CHEST: See top of healed mastectomy scar NEURO: Ambulates with cane, alert, awake, normal speech, no focal deficits, EOMI EXTR: LE trace pitting edema; UE no edema HEENT: Sclera clear, EOMI, occiput with firm nodule that size of a 1cm diameter half-marble and is nontender PSYCH: mildly anxious, becomes tearful during encounter, affect  congruent    Assessment:      Madison [name] is a 37 y.o. female with here for medication review and to meet new MD.    Plan:     # See problem list for problem-specific plans. - Return for health maintenance visit

## 2013-06-17 ENCOUNTER — Telehealth: Payer: Self-pay | Admitting: *Deleted

## 2013-06-17 ENCOUNTER — Other Ambulatory Visit: Payer: Self-pay | Admitting: Family Medicine

## 2013-06-17 NOTE — Telephone Encounter (Signed)
Received prior authorization form from [facility] for NExium.  Form placed in doctor's box for completion, attached are the preferred drugs. Will have md return form after completion to me. Wyatt Haste, RN-BSN

## 2013-06-20 NOTE — Telephone Encounter (Signed)
Refusing because just refilled. Please help me understand why prescriptions filled 06/13/13 are showing up for refills (elavil and capozide)?

## 2013-06-21 ENCOUNTER — Other Ambulatory Visit: Payer: Self-pay | Admitting: Family Medicine

## 2013-06-22 ENCOUNTER — Other Ambulatory Visit: Payer: Self-pay | Admitting: Family Medicine

## 2013-06-22 MED ORDER — PANTOPRAZOLE SODIUM 40 MG PO TBEC: 40.0000 mg | DELAYED_RELEASE_TABLET | Freq: Every day | ORAL | Status: DC

## 2013-06-22 NOTE — Telephone Encounter (Signed)
Received prior auth, however I do not knwo that we have tried 2 other medications and failed. It looks like formulary includes pantoprazole, omeprazole (on back order per Okey Regal), and lansoprazole. Will try pantoprazole. Please call and let patient know. Rx sent.  Leona Singleton, MD

## 2013-06-22 NOTE — Telephone Encounter (Signed)
Refusing capozide because filled 7/14 and now receiving a third request

## 2013-06-22 NOTE — Telephone Encounter (Signed)
Pt called and informed of change in med. FYI - has recently been taking Prilosec with no relief. Wyatt Haste, RN-BSN

## 2013-07-15 ENCOUNTER — Encounter: Payer: Medicare Other | Attending: Physical Medicine & Rehabilitation

## 2013-07-15 ENCOUNTER — Encounter: Payer: Self-pay | Admitting: Physical Medicine & Rehabilitation

## 2013-07-15 ENCOUNTER — Ambulatory Visit (HOSPITAL_BASED_OUTPATIENT_CLINIC_OR_DEPARTMENT_OTHER): Payer: Medicare Other | Admitting: Physical Medicine & Rehabilitation

## 2013-07-15 VITALS — BP 155/84 | HR 91 | Resp 16 | Ht 65.0 in | Wt 257.0 lb

## 2013-07-15 DIAGNOSIS — M47817 Spondylosis without myelopathy or radiculopathy, lumbosacral region: Secondary | ICD-10-CM | POA: Insufficient documentation

## 2013-07-15 DIAGNOSIS — M25559 Pain in unspecified hip: Secondary | ICD-10-CM | POA: Insufficient documentation

## 2013-07-15 DIAGNOSIS — M76899 Other specified enthesopathies of unspecified lower limb, excluding foot: Secondary | ICD-10-CM | POA: Insufficient documentation

## 2013-07-15 DIAGNOSIS — T451X5A Adverse effect of antineoplastic and immunosuppressive drugs, initial encounter: Secondary | ICD-10-CM | POA: Insufficient documentation

## 2013-07-15 DIAGNOSIS — R209 Unspecified disturbances of skin sensation: Secondary | ICD-10-CM | POA: Insufficient documentation

## 2013-07-15 DIAGNOSIS — M25569 Pain in unspecified knee: Secondary | ICD-10-CM | POA: Insufficient documentation

## 2013-07-15 DIAGNOSIS — G894 Chronic pain syndrome: Secondary | ICD-10-CM | POA: Insufficient documentation

## 2013-07-15 DIAGNOSIS — M542 Cervicalgia: Secondary | ICD-10-CM | POA: Insufficient documentation

## 2013-07-15 DIAGNOSIS — G620 Drug-induced polyneuropathy: Secondary | ICD-10-CM | POA: Insufficient documentation

## 2013-07-15 DIAGNOSIS — M549 Dorsalgia, unspecified: Secondary | ICD-10-CM | POA: Insufficient documentation

## 2013-07-15 DIAGNOSIS — IMO0001 Reserved for inherently not codable concepts without codable children: Secondary | ICD-10-CM | POA: Insufficient documentation

## 2013-07-15 DIAGNOSIS — M79609 Pain in unspecified limb: Secondary | ICD-10-CM | POA: Insufficient documentation

## 2013-07-15 DIAGNOSIS — Z79899 Other long term (current) drug therapy: Secondary | ICD-10-CM | POA: Insufficient documentation

## 2013-07-15 NOTE — Progress Notes (Signed)
Bilateral  Trochanteric bursa injection  without ultrasound guidance  Indication Trochanteric bursitis. Exam has tenderness over the greater trochanter of the hip. Pain has not responded to conservative care such as exercise therapy and oral medications. Pain interferes with sleep or with mobility Informed consent was obtained after describing risks and benefits of the procedure with the patient these include bleeding bruising and infection. Patient has signed written consent form. Patient placed in a lateral decubitus position with the affected hip superior. Point of maximal pain was palpated marked and prepped with Betadine and entered with a needle to bone contact. Needle slightly withdrawn then 40 mg Depo-Medrol with 4 cc 1% lidocaine were injected first on the right side. Th this same procedure was repeated on the left side using the same needle injectate and technique. Patient tolerated procedure well. Post procedure instructions given. Patient has hip exercises that she needs to start doing again. Also advised weight loss

## 2013-07-19 ENCOUNTER — Telehealth: Payer: Self-pay | Admitting: Oncology

## 2013-07-19 NOTE — Telephone Encounter (Signed)
pt called to sched md vist per 5.1.14 pof...done...pt going to be at [facility] in OCT wanted to cx oct flush...done

## 2013-07-21 ENCOUNTER — Telehealth: Payer: Self-pay | Admitting: Family Medicine

## 2013-07-21 NOTE — Telephone Encounter (Signed)
Pt reports headaches and that bp is up (181/111) all day long. Recommended to increase water intake, relax, no sodas, low sodum diet. PLease advise if you would like her to make appointment regarding HTN - pt reports that she is taking meds as directed. Wyatt Haste, RN-BSN

## 2013-07-21 NOTE — Telephone Encounter (Signed)
Pt is calling for some advice on her BP. She is taking medication and her BP is high today along with headache. She would like a nurse to call her and give her some advice. JW

## 2013-07-21 NOTE — Telephone Encounter (Signed)
Yes I would like her to be seen, and a provider can see BP in clinic and decide on any medication changes. Thank you. Leona Singleton, MD

## 2013-07-25 ENCOUNTER — Telehealth: Payer: Self-pay | Admitting: *Deleted

## 2013-07-25 ENCOUNTER — Ambulatory Visit: Payer: Self-pay | Admitting: Family Medicine

## 2013-07-25 NOTE — Telephone Encounter (Signed)
Pt called stating that she coming into town today for an appt and want to cancel her flush appt for 07/26/13 and schedule for 07/25/13. gv appt for 07/25/13@ 2pm. Pt is aware and im going to make the flush nurse aware...td

## 2013-07-28 ENCOUNTER — Other Ambulatory Visit: Payer: Self-pay | Admitting: Family Medicine

## 2013-08-02 NOTE — Telephone Encounter (Signed)
Medication stopped in office 03/2013 due to irritating throat, per note. Refused due to no longer on this therapy.

## 2013-08-11 ENCOUNTER — Ambulatory Visit: Payer: Self-pay | Admitting: Family Medicine

## 2013-08-11 ENCOUNTER — Ambulatory Visit: Payer: Medicare Other | Admitting: Physical Medicine & Rehabilitation

## 2013-08-25 ENCOUNTER — Ambulatory Visit (INDEPENDENT_AMBULATORY_CARE_PROVIDER_SITE_OTHER): Payer: Medicare Other | Admitting: Family Medicine

## 2013-08-25 ENCOUNTER — Encounter: Payer: Self-pay | Admitting: Family Medicine

## 2013-08-25 VITALS — BP 135/87 | HR 77 | Temp 98.7°F | Ht 64.0 in | Wt 258.0 lb

## 2013-08-25 DIAGNOSIS — I158 Other secondary hypertension: Secondary | ICD-10-CM

## 2013-08-25 DIAGNOSIS — R296 Repeated falls: Secondary | ICD-10-CM

## 2013-08-25 DIAGNOSIS — G8929 Other chronic pain: Secondary | ICD-10-CM

## 2013-08-25 DIAGNOSIS — I159 Secondary hypertension, unspecified: Secondary | ICD-10-CM

## 2013-08-25 DIAGNOSIS — Z9181 History of falling: Secondary | ICD-10-CM

## 2013-08-25 MED ORDER — AMLODIPINE BESYLATE 10 MG PO TABS: ORAL_TABLET | ORAL | Status: DC

## 2013-08-25 NOTE — Progress Notes (Signed)
Family Medicine Office Visit Note   Subjective:   Patient ID: Madison [name], female  DOB: 02-28-1976, 37 y.o.. MRN: [id number]   Pt that comes for same day appointment complaining of hypertension and falls. Hypertension: Patient reports taking her blood pressure home and been on the 200/90s or 180/65 in several occasions. 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 She denies other symptoms of headache she has a long with a history of seizure disorder. Patient reports that in October 6 will be admitted to [facility] to studies her seizure disorder. Patient's take captopril/hydrochlorothiazide 25/15 and Norvasc 10 mg daily for her control blood pressure. Has been out of Norvasc and is requesting refill.  Her other request today is a shower chair: Patient reports long history of frequent falls. Her last fall was 4 days ago when she she slipped in a wet soapy shower. She reports did not hit her head or other important part of her body.   She is also asking refill on her Norco. She reports has never signed a pain contract with her primary care provider she take this medication on a regular basis as when necessary. She also follows with pain management has been treated steroid injection for her back pain.  Review of Systems:  Per history of present illness  Objective:   Physical Exam: Gen:  NAD, walks assisted with a cane.  HEENT: Moist mucous membranes  CV: Regular rate and rhythm, no murmurs rubs or gallops PULM: Clear to auscultation bilaterally. No wheezes/rales/rhonchi EXT: No edema Neuro: Alert and oriented x3. No focalization  Assessment & Plan:

## 2013-08-25 NOTE — Assessment & Plan Note (Signed)
Prescription for shower chair given to patient.

## 2013-08-25 NOTE — Assessment & Plan Note (Signed)
Patient already on pain management. No sure why she is getting Hydrocodone here. She is also taking other several medications for what it seems chronic pain (Lyrica, amitriptyline, clonazepam, Flexeril, Cymbalta,  Robaxin, Ibuprofen and topical NSAIDs) questionable benefits/role of opioids for her condition. Instructed patient she needs to discuss this with her pain management clinic and her primary doctor. Opioid is not given in this visit.

## 2013-08-25 NOTE — Patient Instructions (Addendum)
I has prescribed you a shower chair. Please take prescription to medical supplies I have also refill your Norvasc 10 mg daily continue taking it as prescribed. No changes in your current blood pressure management. Please keep a log and bring it to your next appointment that should be in one week. Regarding your pain management make specific appointment for this with your primary care doctor and address the use of Norco for your condition with Pain Management clinic you go to.

## 2013-08-25 NOTE — Assessment & Plan Note (Signed)
Normal blood pressure here in our clinic. No changes on her current management. Norvasc refill Follow up in one week with BP log.

## 2013-09-14 ENCOUNTER — Ambulatory Visit: Payer: Self-pay | Admitting: Family Medicine

## 2013-09-19 ENCOUNTER — Telehealth: Payer: Self-pay | Admitting: Family Medicine

## 2013-09-21 NOTE — Telephone Encounter (Signed)
Home visit scores where PSQ 14 . jw

## 2013-09-26 NOTE — Telephone Encounter (Signed)
What does this mean Jacqueline?

## 2013-09-29 ENCOUNTER — Other Ambulatory Visit: Payer: Self-pay | Admitting: Family Medicine

## 2013-09-29 MED ORDER — CAPTOPRIL-HYDROCHLOROTHIAZIDE 25-15 MG PO TABS: ORAL_TABLET | ORAL | Status: DC

## 2013-10-03 ENCOUNTER — Telehealth: Payer: Self-pay | Admitting: Psychology

## 2013-10-03 ENCOUNTER — Ambulatory Visit: Payer: Self-pay | Admitting: Oncology

## 2013-10-03 NOTE — Telephone Encounter (Signed)
Madison [name] left a VM to request an appointment. I called her back and spoke to her daughter.  Asked her to call me back.

## 2013-10-05 ENCOUNTER — Ambulatory Visit (HOSPITAL_COMMUNITY): Payer: Self-pay | Admitting: Psychology

## 2013-10-06 ENCOUNTER — Other Ambulatory Visit: Payer: Self-pay

## 2013-10-10 ENCOUNTER — Telehealth: Payer: Self-pay | Admitting: Family Medicine

## 2013-10-10 NOTE — Telephone Encounter (Addendum)
Madison [name] has been trying to get a shower chair that would be covered by Medicare, but have been unsuccessful so far.  However,spoke with an employee of [employer] supply and they suggested that she use a beside commode as her shower chair because the insurance will cover that.  Need new rx written for this and faxed to them at:  [phone].  Need this asap because she has already had several falls in the shower.  Please call her back today when possible.

## 2013-10-10 NOTE — Telephone Encounter (Signed)
Please let her know I will send that rx to that number this week, likely tomorrow when I am at clinic and have my rx pad. Leona Singleton, MD 10/10/2013 4:38 PM

## 2013-10-11 ENCOUNTER — Telehealth: Payer: Self-pay | Admitting: Family Medicine

## 2013-10-11 NOTE — Telephone Encounter (Signed)
Rx written and put in fax bin.  Leona Singleton, MD 10/11/2013 1:20 PM

## 2013-10-11 NOTE — Telephone Encounter (Signed)
Pt called back. She would like to have the RX faxed to [facility] Fax: [phone] [facility] doesn't accept Medicare and Medicaid

## 2013-10-11 NOTE — Telephone Encounter (Signed)
Left message making pt aware of this. Jamael Hoffmann,CMA

## 2013-10-11 NOTE — Telephone Encounter (Signed)
Faxed to [facility]. Deunte Bledsoe,CMA

## 2013-10-13 ENCOUNTER — Telehealth: Payer: Self-pay | Admitting: Family Medicine

## 2013-10-13 NOTE — Telephone Encounter (Signed)
Pam with [employer] need copy of note showing patient has had a visit within six months.  Also need a dx for patient's need for bedside commode to process order.   [phone] ph#  Fax# [phone]

## 2013-10-13 NOTE — Telephone Encounter (Signed)
Pam informed that pt requested that this go to [facility]. Zylpha Poynor, Maryjo [place]

## 2013-10-13 NOTE — Telephone Encounter (Signed)
Consuella Lose call from [facility] and they would like [place] to re-fax the order for the shower chair. They didn't receive it yet. JW

## 2013-10-14 NOTE — Telephone Encounter (Signed)
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 Signed and placed in fax bin.  Leona Singleton, MD 10/14/2013 11:31 AM

## 2013-10-18 ENCOUNTER — Encounter: Payer: Self-pay | Admitting: *Deleted

## 2013-10-18 ENCOUNTER — Ambulatory Visit (HOSPITAL_BASED_OUTPATIENT_CLINIC_OR_DEPARTMENT_OTHER): Payer: Medicare Other | Admitting: Family

## 2013-10-18 ENCOUNTER — Ambulatory Visit (HOSPITAL_BASED_OUTPATIENT_CLINIC_OR_DEPARTMENT_OTHER): Payer: Medicare Other

## 2013-10-18 ENCOUNTER — Encounter: Payer: Self-pay | Admitting: Family

## 2013-10-18 VITALS — BP 157/89 | HR 92 | Temp 98.1°F | Resp 20 | Ht 64.0 in | Wt 246.3 lb

## 2013-10-18 DIAGNOSIS — F341 Dysthymic disorder: Secondary | ICD-10-CM

## 2013-10-18 DIAGNOSIS — F17200 Nicotine dependence, unspecified, uncomplicated: Secondary | ICD-10-CM

## 2013-10-18 DIAGNOSIS — F3289 Other specified depressive episodes: Secondary | ICD-10-CM

## 2013-10-18 DIAGNOSIS — Z170 Estrogen receptor positive status [ER+]: Secondary | ICD-10-CM

## 2013-10-18 DIAGNOSIS — C50019 Malignant neoplasm of nipple and areola, unspecified female breast: Secondary | ICD-10-CM

## 2013-10-18 DIAGNOSIS — F411 Generalized anxiety disorder: Secondary | ICD-10-CM

## 2013-10-18 DIAGNOSIS — Z853 Personal history of malignant neoplasm of breast: Secondary | ICD-10-CM

## 2013-10-18 DIAGNOSIS — F329 Major depressive disorder, single episode, unspecified: Secondary | ICD-10-CM

## 2013-10-18 DIAGNOSIS — Z95828 Presence of other vascular implants and grafts: Secondary | ICD-10-CM

## 2013-10-18 MED ORDER — HEPARIN SOD (PORK) LOCK FLUSH 100 UNIT/ML IV SOLN
500.0000 [IU] | Freq: Once | INTRAVENOUS | Status: AC
  Administered 2013-10-18: 500 [IU] via INTRAVENOUS
  Filled 2013-10-18: qty 5

## 2013-10-18 MED ORDER — SODIUM CHLORIDE 0.9 % IJ SOLN
10.0000 mL | INTRAMUSCULAR | Status: AC | PRN
  Administered 2013-10-18: 10 mL via INTRAVENOUS
  Filled 2013-10-18: qty 10

## 2013-10-18 NOTE — Patient Instructions (Addendum)
Please contact us at [phone] if you have any questions or concerns.  Get plenty of rest, drink plenty of water, exercise daily (walking or chair exercises as tolerated), eat a balanced diet.  Complete monthly self-breast (chest wall) examinations.  Have a clinical breast exam by a physician every year.  Vitamin E capsule, Replens, coconut oil for VD  Continue taking Anastrazole as directed.

## 2013-10-18 NOTE — Telephone Encounter (Signed)
appts for six month ov was made however pt did not want to make her flush appts at this time. She stated that she would call back at a later time to make her appts. Pt is aware that i will call her with Dr. Betti Cruz appts.Madison [name]

## 2013-10-19 ENCOUNTER — Telehealth: Payer: Self-pay | Admitting: *Deleted

## 2013-10-19 NOTE — Telephone Encounter (Signed)
sw pt gv her the number to [facility]. Informed her that they take walk-ins Mon thru Friday from 8am to 3pm. Pt is aware...td

## 2013-10-20 ENCOUNTER — Encounter: Payer: Self-pay | Admitting: Family

## 2013-10-20 NOTE — Progress Notes (Signed)
[facility]  [phone] [phone]  OFFICE PROGRESS NOTE     ID: Madison [name]   DOB: 05/08/1976  MR#: [id number]  JYN#:[id number]   PCP: Madison Curia, MD GYN: Madison [name] SU: Madison [name] OTHER MD: Madison [name], Madison [name], Madison [name]   HISTORY OF PRESENT ILLNESS: From Dr. Theron Arista [name]'s new patient evaluation note dated 04/05/2009: "This woman is here today with her mother and mother's friend.  She apparently was noted to have a palpable mass in the left breast at about the 3 o'clock position back in October.  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  She had a 19-month followup mammogram.  In March of this year, she began experiencing pain in her breast.  She was seen in the ER at [facility] and subsequently was referred for a mammogram at the breast center.  She had a mammogram on 02/28/2009.  Physical exam at that time showed a palpable superficial subareolar mass extending from the 12 to 3 o'clock position measuring 2 cm.  On physical exam, ultrasound showed this to be about a 2.4 x 2 x 1.4 cm mass with mild posterior acoustic enhancement.  This was a lobular and hypoechoic mass felt to be consistent with a fibroadenoma but malignancy cannot be ruled out.  Of note is that the mass previously measured 1.7 x 1.4 x 1.1 cm back in October.  No abscess was seen.  No axillary adenopathy was seen.  The patient was referred to Dr. Jamey [name].  Because of the feeling this represented a fibroadenoma, an excision was planned and performed on 04/02/2009.  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  Margins were positive for tumor.  Lymphovascular invasion was not identified.  Prior to surgery and there was a concern about an additional lesion seen.  Additional breast tissue removed from the primary surgery showed the  fibrocystic changes and intraductal papilloma.  No other evidence of malignancy.  This tumor was noted to be ER and PR positive.  Special stains were also performed and apparently this was negative for chromogranin and minimal staining for synaptophysin.  Total dimensions of this mass could not be accurately assessed.  It was felt to be about 1.5 cm.  Because of some trabecular and organoid appearance of this lesion in some of the sections, special stains were performed and the tumor was felt to be high grade.  The patient has had an unremarkable postoperative course."  Her subsequent history is as detailed below.   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  She is accompanied by her mother for today's office visit.  Since her last office visit on 03/31/2013, Madison [name] reports her history of seizures, fibromyalgia (that she states was caused by chemotherapy), sensory ataxia, hypertension (that she states was caused by an Avastin), anxiety and depression, headaches, back pain, right leg pain, hot flashes, night sweats, and vaginal dryness continue.  We discussed smoking cessation during her office visit.  She is working with Case Manager Madison Late, RN at [facility] for continued care to receive a psychiatric referral.       REVIEW OF SYSTEMS: A 10 point review of systems was completed and is negative except as noted above.  Madison [name] denies any other symptomatology including  fatigue, fever or chills, vision changes, swollen glands, cough  or shortness of breath, chest pain or discomfort, nausea, vomiting, diarrhea, constipation, change in urinary or bowel habits, any other arthralgias/myalgias, unusual bleeding/bruising or any other symptomatology.  A detailed review of systems was otherwise stable.   PAST MEDICAL HISTORY: Past Medical History  Diagnosis Date  . Tobacco user   . Grand mal seizure     . Fibromyalgia   . Seizures   . Breast CA   . GERD (gastroesophageal reflux disease)   . Depression   . Arthritis   . Allergy   . Hypertension     PAST SURGICAL HISTORY: Past Surgical History  Procedure Laterality Date  . Appendectomy    . Tonsillectomy    . Tubal ligation    . Mastectomy      bilateral  . Cholecystectomy  March 2011  . Abdominal hysterectomy  March 2012  . Breast surgery      bilateral mastectomy  . Port-a-cath insertion      FAMILY HISTORY Family History  Problem Relation Age of Onset  . Diabetes Sister   . Hypertension Maternal Grandfather   Both parents alive, but divorced  Her mother is the Psychologist, occupational at [employer].  She has a sister and brother both who are alive and well in good health.   GYNECOLOGIC HISTORY: She is GX, P2, menarche age 37.  She underwent total abdominal hysterectomy with bilateral salpingo-oophorectomy in 01/2011.  SOCIAL HISTORY: She is separated.  Originally from [place], went to [employer] and subsequently went to [employer]. She is disabled secondary to her fibromyalgia and seizure disorder..  Her daughter Madison [name] (pronounced "Irving Burton") is currently 57, and lives with the patient. Son Madison [name], 21, is a special needs child and lives in a group home.   ADVANCED DIRECTIVES: In place. The patient has a living will but requests no extraordinary support in case of a terminal event. The patient's healthcare power of attorney is her mother, Madison [name], cell number [phone]   HEALTH MAINTENANCE: History  Substance Use Topics  . Smoking status: Current Every Day Smoker -- 1.00 packs/day    Types: Cigarettes  . Smokeless tobacco: Never Used     Comment: 1 ppd for 20 years   . Alcohol Use: No     Colonoscopy: Never  PAP: s/p hysterectomy  Bone density: pending  Lipid panel: Not on file  Allergies  Allergen Reactions  . Taxol [Paclitaxel]   . Tramadol   . Ultram [Tramadol Hcl]     Current  Outpatient Prescriptions  Medication Sig Dispense Refill  . amitriptyline (ELAVIL) 100 MG tablet TAKE 1 TABLET BY MOUTH AT BEDTIME  30 tablet  5  . amLODipine (NORVASC) 10 MG tablet TAKE 1 TABLET BY MOUTH ONCE A DAY  30 tablet  2  . anastrozole (ARIMIDEX) 1 MG tablet Take 1 tablet (1 mg total) by mouth daily.  30 tablet  12  . antiseptic oral rinse (BIOTENE) LIQD 15 mLs by Mouth Rinse route as needed for dry mouth.      . captopril-hydrochlorothiazide (CAPOZIDE) 25-15 MG per tablet TAKE 1 TABLET EVERY DAY  30 tablet  3  . clonazePAM (KLONOPIN) 0.5 MG tablet Take 1 tablet (0.5 mg total) by mouth 2 (two) times daily.  60 tablet  3  . cyclobenzaprine (FLEXERIL) 10 MG tablet TAKE 1 TABLET BY MOUTH TWICE A DAY AS NEEDED FOR MUSCLE SPASM  60 tablet  2  . Diazepam (DIASTAT ACUDIAL) 20 MG GEL Place 20 mg rectally as needed (for  seizures longer than 5 minutes or seizures clusters.).  2 each  3  . DULoxetine (CYMBALTA) 60 MG capsule TAKE 1 CAPSULE BY MOUTH ONCE A DAY  30 capsule  6  . HYDROcodone-acetaminophen (NORCO) 10-325 MG per tablet Take 1 tablet by mouth every 6 (six) hours as needed for pain.  90 tablet  1  . ibuprofen (ADVIL,MOTRIN) 800 MG tablet TAKE 1 TABLET BY MOUTH EVERY 8 HOURS IF NEEDED FOR PAIN. TAKE WITH FOOD.  180 tablet  1  . lamoTRIgine (LAMICTAL) 200 MG tablet Take 200 mg by mouth 2 (two) times daily.       Marland Kitchen levETIRAcetam (KEPPRA) 1000 MG tablet Take 2,000 mg by mouth 2 (two) times daily. Rx by Dr. Inez Catalina, [facility]      . lidocaine (XYLOCAINE) 2 % solution Take 20 mLs by mouth as needed. Pt states she uses after a seizure.      . Melatonin 10 MG TABS Take 1 tablet by mouth at bedtime.      . Multiple Vitamin (MULTIVITAMIN) tablet Take 1 tablet by mouth daily.      Marland Kitchen omeprazole (PRILOSEC) 20 MG capsule Take 20 mg by mouth daily as needed.       . ondansetron (ZOFRAN) 4 MG tablet Take 4 mg by mouth every 8 (eight) hours as needed.      . pregabalin (LYRICA) 150 MG capsule Take 150 mg  by mouth 2 (two) times daily. [facility]- one table in am, 2 in PM      . propranolol (INDERAL) 20 MG tablet Take 20 mg by mouth 2 (two) times daily.      . ranitidine (ZANTAC) 150 MG tablet Take 150 mg by mouth as needed.       . vitamin E (VITAMIN E) 1000 UNIT capsule Take 1,000 Units by mouth daily.      . clonazePAM (KLONOPIN) 1 MG disintegrating tablet Take 2 tablets (2 mg total) by mouth 2 (two) times daily as needed.  60 tablet  3  . mometasone (NASONEX) 50 MCG/ACT nasal spray Place 2 sprays into the nose daily.  17 g  12   No current facility-administered medications for this visit.   Facility-Administered Medications Ordered in Other Visits  Medication Dose Route Frequency Provider Last Rate Last Dose  . heparin lock flush 100 unit/mL  500 Units Intravenous Once Pierce Crane, MD      . sodium chloride 0.9 % injection 10 mL  10 mL Intravenous PRN Pierce Crane, MD      . sodium chloride 0.9 % injection 10 mL  10 mL Intravenous PRN Lowella Dell, MD   10 mL at 10/18/13 1434    OBJECTIVE: Young white woman in no acute distress: Filed Vitals:   10/18/13 1508  BP: 157/89  Pulse: 92  Temp: 98.1 F (36.7 C)  Resp: 20     Body mass index is 42.26 kg/(m^2).      ECOG FS: 1 - Symptomatic but completely ambulatory  General appearance: Alert, cooperative, well nourished, no apparent distress Head: Normocephalic, without obvious abnormality, atraumatic Eyes: Conjunctivae/corneas clear, PERRLA, EOMI Nose: Nares, septum and mucosa are normal, no drainage or sinus tenderness Neck: No adenopathy, supple, symmetrical, trachea midline, no tenderness Resp: Clear to auscultation bilaterally, no wheezes/rales/rhonchi Cardio: Regular rate and rhythm, S1, S2 normal, no murmur, click, rub or gallop, no edema Breasts:  Breasts are surgically absent bilaterally, bilateral chest wall areas have well-healed surgical scar, bilateral axillary fullness GI: Soft,  distended, non-tender, hypoactive bowel  sounds, no organomegaly Skin: No rashes/lesions, skin warm and dry, no erythematous areas, no cyanosis, tattoos, piercings  M/S:  Atraumatic, limited strength and range of motion in bilateral upper and lower extremities, no clubbing  Lymph nodes: Cervical, supraclavicular, and axillary nodes normal Neurologic: Grossly normal, cranial nerves II through XII intact, alert and oriented x 3 Psych: Appropriate affect at times depressed affect at times   LAB RESULTS: Lab Results  Component Value Date   WBC 10.8* 01/11/2013   NEUTROABS 7.2 01/11/2013   HGB 13.1 01/11/2013   HCT 38.9 01/11/2013   MCV 84.7 01/11/2013   PLT 319 01/11/2013      Chemistry      Component Value Date/Time   NA 138 01/11/2013 1440   NA 139 07/27/2012 1300   K 3.9 01/11/2013 1440   K 3.6 07/27/2012 1300   CL 106 01/11/2013 1440   CL 105 07/27/2012 1300   CO2 23 01/11/2013 1440   CO2 23 07/27/2012 1300   BUN 15 01/11/2013 1440   BUN 8.0 07/27/2012 1300   CREATININE 0.85 01/11/2013 1440   CREATININE 0.8 07/27/2012 1300   CREATININE 0.83 01/15/2012 1331      Component Value Date/Time   CALCIUM 9.5 01/11/2013 1440   CALCIUM 9.8 07/27/2012 1300   ALKPHOS 108 01/11/2013 1440   ALKPHOS 126 07/27/2012 1300   AST 15 01/11/2013 1440   AST 23 07/27/2012 1300   ALT 19 01/11/2013 1440   ALT 27 07/27/2012 1300   BILITOT 0.2* 01/11/2013 1440   BILITOT 0.30 07/27/2012 1300       Lab Results  Component Value Date   LABCA2 23 07/27/2012    No components found with this basename: [id number]    No results found for this basename: INR,  in the last 168 hours  Urinalysis    Component Value Date/Time   COLORURINE YELLOW 07/16/2011 1040   APPEARANCEUR CLOUDY* 07/16/2011 1040   LABSPEC 1.019 07/16/2011 1040   LABSPEC 1.015 08/01/2009 1035   PHURINE 6.0 07/16/2011 1040   GLUCOSEU NEGATIVE 07/16/2011 1040   HGBUR TRACE* 07/16/2011 1040   BILIRUBINUR NEG 01/11/2013 1411   BILIRUBINUR NEGATIVE 07/16/2011 1040   KETONESUR TRACE* 07/16/2011 1040    PROTEINUR NEGATIVE 07/16/2011 1040   UROBILINOGEN 0.2 01/11/2013 1411   UROBILINOGEN 0.2 07/16/2011 1040   NITRITE NEG 01/11/2013 1411   NITRITE NEGATIVE 07/16/2011 1040   LEUKOCYTESUR Trace 01/11/2013 1411    STUDIES: Dg Bone Density  06/06/2013   *RADIOLOGY REPORT*  Clinical Data: 37 year old who underwent hysterectomy and bilateral oophorectomy in March, 2012 and is not currently undergoing hormone replacement therapy (surgical menopause), current history of breast cancer for which the patient is on Anastrazole, current history of seizures and depression for which she takes several medications. Baseline examination.  DUAL X-RAY ABSORPTIOMETRY (DXA) FOR BONE MINERAL DENSITY  AP LUMBAR SPINE (L1, L2, L4)  Bone Mineral Density (BMD):  0.960 g/cm2 Young Adult T Score:   -0.7 Z Score:   -0.5  LEFT FEMUR NECK  Bone Mineral Density (BMD):  0.882 g/cm2 Young Adult T Score:   0.3 Z Score:   0.6  RIGHT FOREARM (1/3 RADIUS)  Bone Mineral Density (BMD):            0.715 g/cm2 Young Adult T Score:   0.4  Z Score:   0.7  ASSESSMENT:  Patient's diagnostic category is NORMAL by WHO Criteria.  FRACTURE RISK: NOT INCREASED.  FRAX: World Health Organization FRAX  assessment of absolute fracture risk is not calculated for this patient because the patient has normal bone mineral density.  L3 was excluded from the lumbar spine determination as it had a T- score of greater than one standard deviation higher than that at L4.  L4 has a T-score that is almost two standard deviations lower than L1 or L2, so the validity of the lumbar spine is in question, which is why the forearm was added for confirmation.  Comparison: None.  RECOMMENDATIONS:  All patients should ensure an adequate intake of dietary calcium (1200mg  daily) and vitamin D (800 IU daily) unless contraindicated. The National Osteoporosis Foundation recommends that FDA-approved medical therapies be considered in postmenopausal women and mean age 68 or older with a:  1)    Hip  or vertebral (clinical or morphometric) fracture.  2)   T-score of -2.5 or lower at the spine or hip.  3)   Ten-year fracture probability by FRAX of 3% or greater for hip fracture or 20% or greater for major osteoporotic fracture.  FOLLOW-UP:  People with diagnosed cases of osteoporosis or at high risk for fracture should have regular bone mineral density tests.  For patients eligible for Medicare, routine testing is allowed once every 2 years.  The testing frequency can be increased to one year for patients who have rapidly progressing disease, those who are receiving or discontinuing medical therapy to restore bone mass, or have additional risk factors.   Original Report Authenticated By: Hulan Saas, M.D.    ASSESSMENT: TERRIL AMARO is a  37 y.o.  [place], [place] woman:  (1) Status post left lumpectomy 04/02/2009 for a pT1c NX invasive ductal carcinoma, grade 3, estrogen and progesterone receptor positive, HER-2 not amplified, with positive margins  (2) Status post left modified radical mastectomy 04/09/2009 showing no residual tumor in the breast, but 1/26 lymph nodes involved, for final stage of pT1c pN1, stage IIA; status post subsequent right mastectomy with benign pathology  (3) Treated adjuvantly according to the ECOG E5103 study, with 4 cycles of dose dense Adriamycin/Cytoxan, followed by 10 doses of weekly paclitaxel, all given with bevacizumab or placebo.  (4) Completed postmastectomy radiation to the left chest wall and left supraclavicular fossa 12/07/2009  (5) Received Tamoxifen from 01/2010 until 01/2011  (6) Status post TAH-BSO 02/17/2011 with benign pathology  (7) Started anastrozole in 03/2011  (8) Genetic testing to be scheduled through [facility]  (9) Patient would benefit from psychiatric referral - referred to Dr. Ellamae Sia, psychiatrist on 10/18/2013  PLAN: Halie is tolerating the anastrozole well. The plan is to continue the anastrozole for a total of 10 years  (until 04/22).  Port-A-Cath flushes will be scheduled for every 8 weeks.  The patient will be followed as per the E5103 protocol, namely every 6 months until she completes 5 years, and then yearly for 5 years. We will do lab work and physical exam at each visit. She was counseled regarding smoking cessation. She has not been able to obtain psychiatric support for her smoking cessation, because of the complexity of her medications.   We discussed over-the-counter remedies for vaginal dryness.  All questions answered.  Jenissa and her mother were encouraged to contact us in the interim with any questions, concerns or problems.    Larina Bras NP-C 10/20/2013   6:21 PM

## 2013-10-21 ENCOUNTER — Ambulatory Visit (INDEPENDENT_AMBULATORY_CARE_PROVIDER_SITE_OTHER): Payer: Medicare Other | Admitting: Family Medicine

## 2013-10-21 VITALS — BP 165/94 | HR 94 | Temp 98.6°F | Wt 251.0 lb

## 2013-10-21 DIAGNOSIS — C50919 Malignant neoplasm of unspecified site of unspecified female breast: Secondary | ICD-10-CM

## 2013-10-21 DIAGNOSIS — R569 Unspecified convulsions: Secondary | ICD-10-CM

## 2013-10-21 DIAGNOSIS — I159 Secondary hypertension, unspecified: Secondary | ICD-10-CM

## 2013-10-21 DIAGNOSIS — I158 Other secondary hypertension: Secondary | ICD-10-CM

## 2013-10-21 DIAGNOSIS — G8929 Other chronic pain: Secondary | ICD-10-CM

## 2013-10-21 MED ORDER — HYDROCODONE-ACETAMINOPHEN 10-325 MG PO TABS: 1.0000 | ORAL_TABLET | Freq: Four times a day (QID) | ORAL | Status: DC | PRN

## 2013-10-21 NOTE — Patient Instructions (Addendum)
It was great to see you today.  I am refilling norco 90 tabs.  Follow up with me in 4 months to rediscuss pain. We are getting a pain contract today.  For your blood pressure, it is a little high but this could be due to increased pain right now. Write down values and bring to me (3 days/week). When you return, if you are still elevated, we may increase medication.  Come back in 3 weeks to follow up BP. Find out if you would like the flu shot (ask your oncologist) and we would be happy do to that.  Leona Singleton, MD

## 2013-10-21 NOTE — Assessment & Plan Note (Addendum)
Recent hospitalization at [facility] for seizures, none seen there per pt. Stable. Unable to access Care Everywhere if not in encounter. Modesto Charon increased lamictal to 200mg  BID. Continue keppra 2000mg  BID. - Follow up with neurology as scheudled - Follow up with psyciatrist as set up by oncologist - pt waiting to hear

## 2013-10-21 NOTE — Assessment & Plan Note (Signed)
Stable - Followed by Oncology - Has regular appts - Recently referred to psychiatry to help deal with issues - Dr Betti Cruz, waiting to hear

## 2013-10-21 NOTE — Assessment & Plan Note (Signed)
Reports pain "nearly all the time." - Refilled norco 90 tablets 0 refills - Return 4 months for pain follow up, pt stated 90 tabs last 4 months - Pain contract today - Urine drug screen and check narc database at f/u - Has referral to see psychiatrist; with psych visits, in future will wean down narcotics. - Follow up with pt on if ever got in to see [facility] per Dr Melina Modena referral.

## 2013-10-21 NOTE — Assessment & Plan Note (Signed)
Elevated in clinic, pt in pain. - Reportedly frequently in pain.  - Did not log BPs. Bring BPs log into clinic. - F/u in 3w, at which time consider increase medication.

## 2013-10-21 NOTE — Progress Notes (Signed)
Patient ID: Madison [name], female   DOB: 1976-02-03, 37 y.o.   MRN: [id number] Subjective:   CC: Follow up BP and pain.  HPI:   1. BP - Pt checks at home, often is high with pain (180s/100s), 130s/70 when not in pain. Is in pain more often than not. Pt has tried to control pain with pain clinic and medication (see below). Takes norvasc and captopril-HCTZ daily, denies discernible side effects. Occasionally feels dizzy.   2. Pain - Located right leg and low back, 9/10, Weather makes it worse. Denies sudden weakness, incontinence (other than chronic urge incontinence), or perineal numbness.  - Sees Pain Clinic (Dr Wynn Banker) who gives her injections but does not prescribe narcotics.  - Requesting norco which she takes chronically and gets prescribed only here per pt. 90 tabs last her 4 months per pt.  - Reports no SEs from narcotic (confusion, difficulty breathing). Also uses aleve, ibuprofen 800mg , lyrica, elavil, and flexeril.  3. Recent reported seizures - Went to Epileptic monitoring unit at [facility] for seizure activity but had no seizures while there, then had seizure at home. Dr Modesto Charon increased her lamictal to 200mg  BID and pt taking keppra 2000mg  BID.  4. H/o breast cancer - Just saw onc PA. Recommended psychiatrist. Can't see because of epilepsy. CM is helping arrange. Referred to Dr Betti Cruz and waiting to hear.  Review of Systems - Per HPI. Additionally, reports left hand digits 4 and 5 with numbness, wondering if pinched nerve in neck or carpal tunnel syndrome.   PMH: Meds reviewed - Propranolol stopped by neurologist Oct - Lamictal recently increased to 200mg  BID  Objective:  Physical Exam BP 165/94  Pulse 94  Temp(Src) 98.6 F (37 C) (Oral)  Wt 251 lb (113.853 kg) GEN: NAD, though somewhat down-appearing HEENT: Atraumatic, normocephalic, neck supple, EOMI, sclera clear  CV: RRR, no murmurs, rubs, or gallops PULM: CTAB, normal effort; chest with bilateral mastectomy and  healed scars. ABD: Soft, nontender, nondistended, no organomegaly SKIN: No rash or cyanosis; warm and well-perfused EXTR: Moves all extremities spontaneously and equally PSYCH: Mood and affect slightly depressed, normal rate and volume of speech NEURO: Awake, alert, no focal deficits grossly, normal speech, normal gait    Assessment:     Madison [name] is a 37 y.o. female here for follow-up.    Plan:     # See problem list and after visit summary for problem-specific plans. - Of note: Return to discuss numbness in fingers.  # Health Maintenance:  - Return for this. Address pap (h/o hysterectomy for risk of cancer with breast cancer), TDAP, flu shot. Pt will ask oncologist about flu shot given her concern about lowered immune system.  Follow-up: Follow up in 3 weeks for BP re-eval, 4 months for pain re-evaluation, and when convenient for health maintenance visit.   Leona Singleton, MD [facility]

## 2013-10-24 ENCOUNTER — Encounter: Payer: Self-pay | Admitting: Clinical

## 2013-10-24 NOTE — Progress Notes (Signed)
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   Personal Goals - " I want to lose weight." " I want a therapist and a psychiatrist." " I want to get transportation for my medical appointments."  STG: Pt will be linked with [facility] within 2 weeks. LTG: Pt will receive [facility] within 1 month. 09/14/13  STG: Pt will be linked with [employer] within 2 weeks. LTG: Pt will utilize [employer] within 1 month. 09/14/13

## 2013-11-03 ENCOUNTER — Encounter: Payer: Self-pay | Admitting: Physical Medicine & Rehabilitation

## 2013-11-03 ENCOUNTER — Encounter: Payer: Medicare Other | Attending: Physical Medicine & Rehabilitation

## 2013-11-03 ENCOUNTER — Ambulatory Visit (HOSPITAL_BASED_OUTPATIENT_CLINIC_OR_DEPARTMENT_OTHER): Payer: Medicare Other | Admitting: Physical Medicine & Rehabilitation

## 2013-11-03 VITALS — BP 166/92 | HR 110 | Resp 14 | Ht 64.5 in | Wt 251.0 lb

## 2013-11-03 DIAGNOSIS — M76899 Other specified enthesopathies of unspecified lower limb, excluding foot: Secondary | ICD-10-CM | POA: Insufficient documentation

## 2013-11-03 DIAGNOSIS — M549 Dorsalgia, unspecified: Secondary | ICD-10-CM | POA: Insufficient documentation

## 2013-11-03 DIAGNOSIS — R209 Unspecified disturbances of skin sensation: Secondary | ICD-10-CM | POA: Insufficient documentation

## 2013-11-03 DIAGNOSIS — T451X5A Adverse effect of antineoplastic and immunosuppressive drugs, initial encounter: Secondary | ICD-10-CM | POA: Insufficient documentation

## 2013-11-03 DIAGNOSIS — M542 Cervicalgia: Secondary | ICD-10-CM | POA: Insufficient documentation

## 2013-11-03 DIAGNOSIS — M7550 Bursitis of unspecified shoulder: Secondary | ICD-10-CM | POA: Insufficient documentation

## 2013-11-03 DIAGNOSIS — M79609 Pain in unspecified limb: Secondary | ICD-10-CM | POA: Insufficient documentation

## 2013-11-03 DIAGNOSIS — G894 Chronic pain syndrome: Secondary | ICD-10-CM | POA: Insufficient documentation

## 2013-11-03 DIAGNOSIS — M47817 Spondylosis without myelopathy or radiculopathy, lumbosacral region: Secondary | ICD-10-CM | POA: Insufficient documentation

## 2013-11-03 DIAGNOSIS — M25569 Pain in unspecified knee: Secondary | ICD-10-CM | POA: Insufficient documentation

## 2013-11-03 DIAGNOSIS — M7551 Bursitis of right shoulder: Secondary | ICD-10-CM

## 2013-11-03 DIAGNOSIS — IMO0001 Reserved for inherently not codable concepts without codable children: Secondary | ICD-10-CM | POA: Insufficient documentation

## 2013-11-03 DIAGNOSIS — M25559 Pain in unspecified hip: Secondary | ICD-10-CM | POA: Insufficient documentation

## 2013-11-03 DIAGNOSIS — M75100 Unspecified rotator cuff tear or rupture of unspecified shoulder, not specified as traumatic: Secondary | ICD-10-CM

## 2013-11-03 DIAGNOSIS — Z79899 Other long term (current) drug therapy: Secondary | ICD-10-CM | POA: Insufficient documentation

## 2013-11-03 DIAGNOSIS — G620 Drug-induced polyneuropathy: Secondary | ICD-10-CM | POA: Insufficient documentation

## 2013-11-03 NOTE — Progress Notes (Signed)
Subjective:    Patient ID: Madison [name], female    DOB: 1975/12/11, 37 y.o.   MRN: [id number]  HPI  R. initially here for bilateral shoulder injections however she states that her right shoulder and right hip are the most painful. Has had good results with prior right trochanteric bursa injection as well as right subacromial bursa injection  Also request Toradol for overall body pain  Also complaining of left fourth and fifth digit numbness. No falls no trauma to elbow. No weakness in the hand. Symptom onset one month ago. Has neck pain. Pain Inventory Average Pain 7 Pain Right Now 9 My pain is sharp, burning, stabbing, tingling and aching  In the last 24 hours, has pain interfered with the following? General activity 6 Relation with others 2 Enjoyment of life 8 What TIME of day is your pain at its worst? all day Sleep (in general) Fair  Pain is worse with: walking, bending, inactivity, standing and some activites Pain improves with: heat/ice, pacing activities, medication and injections Relief from Meds: 5  Mobility walk with assistance use a cane how many minutes can you walk? na ability to climb steps?  yes needs help with transfers  Function disabled: date disabled 2010 I need assistance with the following:  household duties and shopping  Neuro/Psych weakness numbness tremor tingling spasms confusion depression anxiety  Prior Studies Any changes since last visit?  no  Physicians involved in your care Any changes since last visit?  no   Family History  Problem Relation Age of Onset  . Diabetes Sister   . Hypertension Maternal Grandfather    History   Social History  . Marital Status: Legally Separated    Spouse Name: N/A    Number of Children: N/A  . Years of Education: N/A   Social History Main Topics  . Smoking status: Current Every Day Smoker -- 1.00 packs/day    Types: Cigarettes  . Smokeless tobacco: Never Used     Comment: 1 ppd  for 20 years   . Alcohol Use: No  . Drug Use: No  . Sexual Activity: Not Currently    Birth Control/ Protection: Surgical   Other Topics Concern  . None   Social History Narrative   Living w parents in [place] due to cancer and inability to take car for herself or others. Has 3 children. 63 yo has bipolar and MR, currently in foster home. Other 2 live with her. Divorced.    Past Surgical History  Procedure Laterality Date  . Appendectomy    . Tonsillectomy    . Tubal ligation    . Mastectomy      bilateral  . Cholecystectomy  March 2011  . Abdominal hysterectomy  March 2012  . Breast surgery      bilateral mastectomy  . Port-a-cath insertion     Past Medical History  Diagnosis Date  . Tobacco user   . Grand mal seizure   . Fibromyalgia   . Seizures   . Breast CA   . GERD (gastroesophageal reflux disease)   . Depression   . Arthritis   . Allergy   . Hypertension    BP 166/92  Pulse 110  Resp 14  Ht 5' 4.5" (1.638 m)  Wt 251 lb (113.853 kg)  BMI 42.43 kg/m2  SpO2 92%      Review of Systems  Constitutional: Positive for diaphoresis and unexpected weight change.  Respiratory: Positive for shortness of breath.   Cardiovascular:  Positive for leg swelling.  Gastrointestinal: Positive for vomiting.  Skin: Positive for rash.  Neurological: Positive for tremors, weakness and numbness.       Tingling, spasms  Psychiatric/Behavioral: Positive for confusion and dysphoric mood. The patient is nervous/anxious.   All other systems reviewed and are negative.       Objective:   Physical Exam        Assessment & Plan:  Right subacromial bursitis       Shoulder injection Right   Indication:RightShoulder pain not relieved by medication management and other conservative care.  Informed consent was obtained after describing risks and benefits of the procedure with the patient, this includes bleeding, bruising, infection and medication side effects. The patient wishes  to proceed and has given written consent. Patient was placed in a seated position. TheRight shoulder was marked and prepped with betadine in the subacromial area. A 25-gauge 1-1/2 inch needle was inserted into the subacromial area. After negative draw back for blood, a solution containing 1 mL of 6 mg per ML celestone and 3 mL of 1% lidocaine was injected. A band aid was applied. The patient tolerated the procedure well. Post procedure instructions were given.  2. Right trochanteric bursitis  Trochanteric bursa injection without ultrasound guidance  Indication Trochanteric bursitis. Exam has tenderness over the greater trochanter of the hip. Pain has not responded to conservative care such as exercise therapy and oral medications. Pain interferes with sleep or with mobility Informed consent was obtained after describing risks and benefits of the procedure with the patient these include bleeding bruising and infection. Patient has signed written consent form. Patient placed in a lateral decubitus position with the affected hip superior. Point of maximal pain was palpated marked and prepped with Betadine and entered with a needle to bone contact. Needle slightly withdrawn then 6mg  of betamethasone with 4 cc 1% lidocaine were injected. Patient tolerated procedure well. Post procedure instructions given.  3. Left fourth and fifth digit numbness 1 month duration EMG/nerve conduction study in one month

## 2013-11-03 NOTE — Patient Instructions (Signed)
Ulnar Nerve Contusion with Rehab The ulnar nerve lies near the surface of the skin as it passes by the elbow. This location causes it to be susceptible to injury. An ulnar nerve contusion is a bruise of the nerve. It is the result of direct trauma to the elbow. Ulnar nerve contusions are characterized by pain, weakness, and loss of feeling in the hand. SYMPTOMS   Signs of nerve damage include: tingling, numbness, weakness, and/or loss of feeling in the hand, specifically the little finger and ring finger.  Sharp pains that may shoot from the elbow to the wrist and hand.  Decreased hand function.  Tenderness and/ or inflammation in the elbow.  Muscle wasting (atrophy) in the hand. CAUSES  Ulnar nerve contusions are caused by direct trauma to the elbow that results in bleeding which enters the nerve. RISK INCREASES WITH:  Contact sports (football, soccer, or rugby).  Bleeding disorders.  Taking blood thinning medicine (warfarin [Coumadin], aspirin, or nonsteroidal anti-inflammatory medications).  Diabetes mellitus.  Underactive thyroid gland (hypothyroidism). PREVENTION  Wear properly fitted and padded protective equipment.  Only take blood thinning medication when necessary. PROGNOSIS  Ulnar nerve contusions usually heal within 6 weeks. Healing often occurs spontaneously, but treatment helps reduce symptoms.  RELATED COMPLICATIONS   Permanent nerve damage, including pain, numbness, tingling, or weakness in the hand (rare).  Weak grip.  Prolonged healing time, if improperly treated or re-injured. TREATMENT  Treatment initially involves resting from any activities that aggravate the symptoms, and the use of ice and medications to help reduce pain and inflammation. The use of strengthening and stretching exercises may help reduce pain with activity. These exercises may be performed at home or with referral to a therapist. Your caregiver may recommend that you splint the elbow at  night to help healing of the nerve. If symptoms persist despite conservative (non-surgical) treatment, then surgery may be recommended to free the nerve. MEDICATION   If pain medication is necessary, then nonsteroidal anti-inflammatory medications, such as aspirin and ibuprofen, or other minor pain relievers, such as acetaminophen, are often recommended.  Do not take pain medication within 7 days before surgery.  Prescription pain relievers may be given if deemed necessary by your caregiver. Use only as directed and only as much as you need. COLD THERAPY  Cold treatment (icing) relieves pain and reduces inflammation. Cold treatment should be applied for 10 to 15 minutes every 2 to 3 hours for inflammation and pain and immediately after any activity that aggravates your symptoms. Use ice packs or massage the area with a piece of ice (ice massage). SEEK MEDICAL CARE IF:   Treatment seems to offer no benefit, or the condition worsens.  Any medications produce adverse side effects. EXERCISES RANGE OF MOTION (ROM) AND STRETCHING EXERCISES - Ulnar Nerve Contusion These exercises may help you when beginning to rehabilitate your injury. Do not begin these exercises until your physician, physical therapist or athletic trainer advises you to do so. Discontinue any exercise that worsens your symptoms. Contact your physician with instructions on how to continue. Your symptoms may resolve with or without further involvement from your physician, physical therapist or athletic trainer. While completing these exercises, remember:  Restoring tissue flexibility helps normal motion to return to the joints. This allows healthier, less painful movement and activity.  An effective stretch should be held for at least 30 seconds.  A stretch should never be painful. You should only feel a gentle lengthening or release in the stretched tissue. RANGE   OF MOTION  Extension  Hold your right / left arm at your side and  straighten your elbow as far as you can using your right / left arm muscles.  Straighten the right / left elbow farther by gently pushing down on your forearm until you feel a gentle stretch on the inside of your elbow. Hold this position for __________ seconds.  Slowly return to the starting position. Repeat __________ times. Complete this exercise __________ times per day.  RANGE OF MOTION  Flexion  Hold your right / left arm at your side and bend your elbow as far as you can using your right / left arm muscles.  Bend the right / left elbow farther by gently pushing up on your forearm until you feel a gentle stretch on the outside of your elbow. Hold this position for __________ seconds.  Slowly return to the starting position. Repeat __________ times. Complete this exercise __________ times per day.  RANGE OF MOTION  Wrist Flexion, Active-Assisted  Extend your right / left elbow with your fingers pointing down.*  Gently pull the back of your hand towards you until you feel a gentle stretch on the top of your forearm.  Hold this position for __________ seconds. Repeat __________ times. Complete this exercise __________ times per day.  *If directed by your physician, physical therapist or athletic trainer, complete this stretch with your elbow bent rather than extended. RANGE OF MOTION  Wrist Extension, Active-Assisted  Extend your right / left elbow and turn your palm upwards.*  Gently pull your palm/fingertips back so your wrist extends and your fingers point more toward the ground.  You should feel a gentle stretch on the inside of your forearm.  Hold this position for __________ seconds. Repeat __________ times. Complete this exercise __________ times per day. *If directed by your physician, physical therapist or athletic trainer, complete this stretch with your elbow bent, rather than extended. RANGE OF MOTION  Supination, Active  Stand or sit with your elbows at your side.  Bend your right / left elbow to 90 degrees.  Turn your palm upward until you feel a gentle stretch on the inside of your forearm.  Hold this position for __________ seconds. Slowly release and return to the starting position. Repeat __________ times. Complete this stretch __________ times per day.  RANGE OF MOTION  Pronation, Active  Stand or sit with your elbows at your side. Bend your right / left elbow to 90 degrees.  Turn your palm downward until you feel a gentle stretch on the top of your forearm.  Hold this position for __________ seconds. Slowly release and return to the starting position. Repeat __________ times. Complete this stretch __________ times per day.  STRETCH - Wrist Flexion   Place the back of your right / left hand on a tabletop leaving your elbow slightly bent. Your fingers should point away from your body.  Gently press the back of your hand down onto the table by straightening your elbow. You should feel a stretch on the top of your forearm.  Hold this position for __________ seconds. Repeat __________ times. Complete this stretch __________ times per day.  STRETCH  Wrist Extension   Place your right / left fingertips on a tabletop leaving your elbow slightly bent. Your fingers should point backwards.  Gently press your fingers and palm down onto the table by straightening your elbow. You should feel a stretch on the inside of your forearm.  Hold this position for __________   seconds. Repeat __________ times. Complete this stretch __________ times per day.  STRENGTHENING EXERCISES - Ulnar Nerve Contusion These exercises may help you when beginning to rehabilitate your injury. Do not begin these exercises until your physician, physical therapist or athletic trainer advises you to do so. Discontinue any exercise that worsens your symptoms. Contact your physician for instructions on how to continue. They may resolve your symptoms with or without further involvement  from your physician, physical therapist or athletic trainer. While completing these exercises, remember:   Muscles can gain both the endurance and the strength needed for everyday activities through controlled exercises.  Complete these exercises as instructed by your physician, physical therapist or athletic trainer. Progress with the resistance and repetition exercises only as your caregiver advises. STRENGTH Wrist Flexors  Sit with your right / left forearm palm-up and fully supported. Your elbow should be resting below the height of your shoulder. Allow your wrist to extend over the edge of the surface.  Loosely holding a __________ weight or a piece of rubber exercise band/tubing, slowly curl your hand up toward your forearm.  Hold this position for __________ seconds. Slowly lower the wrist back to the starting position in a controlled manner. Repeat __________ times. Complete this exercise __________ times per day.  STRENGTH  Wrist Extensors  Sit with your right / left forearm palm-down and fully supported. Your elbow should be resting below the height of your shoulder. Allow your wrist to extend over the edge of the surface.  Loosely holding a __________ weight or a piece of rubber exercise band/tubing, slowly curl your hand up toward your forearm.  Hold this position for __________ seconds. Slowly lower the wrist back to the starting position in a controlled manner. Repeat __________ times. Complete this exercise __________ times per day.  STRENGTH - Ulnar Deviators  Stand with a ____________________ weight in your right / left hand or sit holding on to the rubber exercise band/tubing with your opposite arm supported.  Move your wrist so that your pinkie travels toward your forearm and your thumb moves away from your forearm.  Hold this position for __________ seconds and then slowly lower the wrist back to the starting position. Repeat __________ times. Complete this exercise  __________ times per day STRENGTH - Radial Deviators  Stand with a ____________________ weight in your right / left hand or sit holding on to the rubber exercise band/tubing with your arm supported.  Raise your hand upward in front of you or pull up on the rubber tubing.  Hold this position for __________ seconds and then slowly lower the wrist back to the starting position. Repeat __________ times. Complete this exercise __________ times per day. STRENGTH - Grip  Grasp a tennis ball, a dense sponge, or a large, rolled sock in your hand.  Squeeze as hard as you can without increasing any pain.  Hold this position for __________ seconds. Release your grip slowly. Repeat __________ times. Complete this exercise __________ times per day.  Document Released: 11/17/2005 Document Revised: 02/09/2012 Document Reviewed: 03/01/2009 [employer] Patient Information 2014 [employer].  

## 2013-11-14 ENCOUNTER — Other Ambulatory Visit: Payer: Self-pay | Admitting: Family Medicine

## 2013-11-16 ENCOUNTER — Encounter: Payer: Self-pay | Admitting: Family Medicine

## 2013-11-16 ENCOUNTER — Ambulatory Visit (INDEPENDENT_AMBULATORY_CARE_PROVIDER_SITE_OTHER): Payer: Medicare Other | Admitting: Family Medicine

## 2013-11-16 VITALS — BP 154/92 | HR 114 | Temp 98.1°F | Wt 246.0 lb

## 2013-11-16 DIAGNOSIS — I159 Secondary hypertension, unspecified: Secondary | ICD-10-CM

## 2013-11-16 DIAGNOSIS — J328 Other chronic sinusitis: Secondary | ICD-10-CM

## 2013-11-16 DIAGNOSIS — R5381 Other malaise: Secondary | ICD-10-CM

## 2013-11-16 DIAGNOSIS — I158 Other secondary hypertension: Secondary | ICD-10-CM

## 2013-11-16 DIAGNOSIS — J329 Chronic sinusitis, unspecified: Secondary | ICD-10-CM

## 2013-11-16 MED ORDER — FLUCONAZOLE 150 MG PO TABS: 150.0000 mg | ORAL_TABLET | Freq: Once | ORAL | Status: DC

## 2013-11-16 MED ORDER — AMOXICILLIN-POT CLAVULANATE 875-125 MG PO TABS: 1.0000 | ORAL_TABLET | Freq: Two times a day (BID) | ORAL | Status: DC

## 2013-11-16 NOTE — Assessment & Plan Note (Signed)
With pearly TMs and tender sinuses, symptoms almost 1 week, chills, and h/o breast cancer, would treat conservatively for acute on chronic sinusitis. Vitals stable in clinic. - Augmentin x 10 days which pt states has worked well for sinus infections in the past. - Diflucan 150mg  PO x 1 prn given pt frequently gets yeast infections with augmentin. - Rest, warm liquids, return precautions reviewed.

## 2013-11-16 NOTE — Patient Instructions (Signed)
You are right, this looks like a sinus infection on exam. - Please take augmentin twice daily for 10 days. - If you get a yeast infection, I have prescribed diflucan. However, if there are any atypical smyptoms or it does not get better, you need to return. - Rest, plenty of warm liquids, and follow up immediately if you have shortness of breath, severe chest pain, dizziness, or other concerns. Come back in 1 week if no better. - Follow up in 1-2 months for blood pressure.

## 2013-11-16 NOTE — Assessment & Plan Note (Signed)
With pt complaint and already walking with cane, she would truly benefit from some conditioning rehabilitation and can make it to outpatient PT. - PT referral placed.

## 2013-11-16 NOTE — Assessment & Plan Note (Addendum)
Continues to be elevated in clinic but each time pt reports pain or today sinus symptoms. Home log BPs in 130s/90s. Could be side effect of avastin however question effect from previous use if pt no longer taking this. - Will not increase medication at this time. - F/u in 1-2 months, bring BP log, and decide on med changes at that time. - Continue working on weight loss.

## 2013-11-16 NOTE — Progress Notes (Signed)
Patient ID: Madison [name], female   DOB: [birth year], 37 y.o.   MRN: [id number] Subjective:   CC: Feeling sick, f/u HTN  HPI:   1. Feeling sick - Pt reports 6 days of sinus symptoms similar to previous sinusitis for which augmentin has worked in the past (and not caused seizures). She reports facial stuffiness and pain, chills, nasal / sinus drainage that she swallows and vomits green stuff, and throat and ear pain. She denies fevers, rash, cough, sneezing, diarrhea, or chest pain more than baseline, and dyspnea greater than baseline "shallow breathing".  2. HTN - Patient forgot to bring her log but BP has been running 130s/90s at home most of the time with occasional highs into 150 systolic that she attributes to pain and anxiety.  Denies headaches or blurred vision and does not report signs of low BP. Tries to watch salt intake and takes medication regularly.  Review of Systems - Per HPI. Additionally, her previous rib pain is making her feel tight and breathe shallowly - this has been a chronic issue.  Also, she feels like she is not as strong as she would like to be and is becoming deconditioned. She would like to try physical therapy, which has helped in the past. She does not report any specific weakness or difficulty with speech or gait.  PMH: Reviewed. H/o yeast infections with antibiotic use.    Objective:  Physical Exam BP 154/92  Pulse 114  Wt 246 lb (111.585 kg)  SpO2 93% 98.1 GEN: NAD, anxious-appearing HEENT: Atraumatic, normocephalic, neck supple, EOMI, sclera clear, TMs pearly bilaterally with mild retraction, no erythema; sinus tenderness maxillary and frontal sinuses, o/p clear CV: RRR, no murmurs, rubs, or gallops PULM: CTAB, normal effort ABD: Soft, nontender, nondistended, NABS, no organomegaly SKIN: No rash or cyanosis; warm and well-perfused EXTR: No lower extremity edema or calf tenderness PSYCH: Mood and affect mildly anxious, normal rate and volume of  speech NEURO: Awake, alert, no focal deficits grossly, normal speech, walks with cane  Assessment:     Madison [name] is a 37 y.o. female here for follow up of HTN, sinus symptoms, and generalized deconditioning.    Plan:     # See problem list and after visit summary for problem-specific plans.   # Health Maintenance:  - Prefers not to get flu shot after discussing with oncologist. - Needs return visit for HM: Address pap (h/o hysterectomy for risk of cancer with breast cancer), TDAP  Follow-up: Follow up in 1-2 mo for BP re-eval, 3 mo for pain management When convenient for Health Maintenance visit  Flu shot - asked oncologist and prefers not to get at this time.  Leona Singleton, MD [facility]

## 2013-11-16 NOTE — Assessment & Plan Note (Deleted)
With pearly TMs and tender sinuses, symptoms almost 1 week, chills, and h/o breast cancer, would treat conservatively. Vitals stable in clinic. - Augmentin x 10 days which pt states has worked well for sinus infections in the past. - Diflucan 150mg  PO x 1 prn given pt frequently gets yeast infections with augmentin. - Rest, warm liquids, return precautions reviewed.

## 2013-11-24 ENCOUNTER — Other Ambulatory Visit: Payer: Self-pay | Admitting: Family Medicine

## 2013-11-27 NOTE — Telephone Encounter (Signed)
Refused capozide because refilled in 08/2013 for 4 month supply.

## 2013-11-28 ENCOUNTER — Telehealth: Payer: Self-pay | Admitting: Oncology

## 2013-12-07 ENCOUNTER — Ambulatory Visit: Payer: Medicare Other | Attending: Family Medicine | Admitting: Physical Therapy

## 2013-12-07 DIAGNOSIS — IMO0001 Reserved for inherently not codable concepts without codable children: Secondary | ICD-10-CM | POA: Insufficient documentation

## 2013-12-07 DIAGNOSIS — R269 Unspecified abnormalities of gait and mobility: Secondary | ICD-10-CM | POA: Insufficient documentation

## 2013-12-07 DIAGNOSIS — Z9181 History of falling: Secondary | ICD-10-CM | POA: Insufficient documentation

## 2013-12-07 DIAGNOSIS — F411 Generalized anxiety disorder: Secondary | ICD-10-CM | POA: Insufficient documentation

## 2013-12-07 DIAGNOSIS — Z853 Personal history of malignant neoplasm of breast: Secondary | ICD-10-CM | POA: Insufficient documentation

## 2013-12-07 DIAGNOSIS — R5381 Other malaise: Secondary | ICD-10-CM | POA: Insufficient documentation

## 2013-12-07 DIAGNOSIS — F3289 Other specified depressive episodes: Secondary | ICD-10-CM | POA: Insufficient documentation

## 2013-12-07 DIAGNOSIS — F329 Major depressive disorder, single episode, unspecified: Secondary | ICD-10-CM | POA: Insufficient documentation

## 2013-12-07 DIAGNOSIS — G40909 Epilepsy, unspecified, not intractable, without status epilepticus: Secondary | ICD-10-CM | POA: Insufficient documentation

## 2013-12-07 DIAGNOSIS — I10 Essential (primary) hypertension: Secondary | ICD-10-CM | POA: Insufficient documentation

## 2013-12-07 DIAGNOSIS — I456 Pre-excitation syndrome: Secondary | ICD-10-CM | POA: Insufficient documentation

## 2013-12-07 DIAGNOSIS — M6281 Muscle weakness (generalized): Secondary | ICD-10-CM | POA: Insufficient documentation

## 2013-12-08 ENCOUNTER — Ambulatory Visit: Payer: Self-pay | Admitting: Physical Medicine & Rehabilitation

## 2013-12-09 ENCOUNTER — Ambulatory Visit: Payer: Medicare Other | Admitting: Physical Therapy

## 2013-12-12 ENCOUNTER — Ambulatory Visit: Payer: Medicare Other | Admitting: Physical Therapy

## 2013-12-14 ENCOUNTER — Ambulatory Visit: Payer: Self-pay | Admitting: Physical Therapy

## 2013-12-15 ENCOUNTER — Ambulatory Visit: Payer: Medicare Other | Admitting: Rehabilitative and Restorative Service Providers"

## 2013-12-19 ENCOUNTER — Telehealth: Payer: Self-pay | Admitting: Family Medicine

## 2013-12-19 ENCOUNTER — Ambulatory Visit (INDEPENDENT_AMBULATORY_CARE_PROVIDER_SITE_OTHER): Payer: Medicare Other | Admitting: Family Medicine

## 2013-12-19 ENCOUNTER — Encounter: Payer: Self-pay | Admitting: Family Medicine

## 2013-12-19 VITALS — BP 147/96 | HR 102 | Temp 98.6°F | Wt 251.0 lb

## 2013-12-19 DIAGNOSIS — B370 Candidal stomatitis: Secondary | ICD-10-CM

## 2013-12-19 DIAGNOSIS — I159 Secondary hypertension, unspecified: Secondary | ICD-10-CM

## 2013-12-19 DIAGNOSIS — J329 Chronic sinusitis, unspecified: Secondary | ICD-10-CM

## 2013-12-19 DIAGNOSIS — K219 Gastro-esophageal reflux disease without esophagitis: Secondary | ICD-10-CM

## 2013-12-19 DIAGNOSIS — I158 Other secondary hypertension: Secondary | ICD-10-CM

## 2013-12-19 DIAGNOSIS — J328 Other chronic sinusitis: Secondary | ICD-10-CM

## 2013-12-19 DIAGNOSIS — R5381 Other malaise: Secondary | ICD-10-CM

## 2013-12-19 MED ORDER — LEVOFLOXACIN 500 MG PO TABS: 500.0000 mg | ORAL_TABLET | Freq: Every day | ORAL | Status: DC

## 2013-12-19 MED ORDER — CAPTOPRIL-HYDROCHLOROTHIAZIDE 25-25 MG PO TABS: 1.0000 | ORAL_TABLET | Freq: Every day | ORAL | Status: DC

## 2013-12-19 MED ORDER — AMBULATORY NON FORMULARY MEDICATION: Status: DC

## 2013-12-19 MED ORDER — ESOMEPRAZOLE MAGNESIUM 40 MG PO CPDR: 40.0000 mg | DELAYED_RELEASE_CAPSULE | Freq: Every day | ORAL | Status: DC

## 2013-12-19 NOTE — Telephone Encounter (Signed)
Please call patient to let her know that in regards to her question about crushing meds/going to dissolvable, here is what I found:  I hesitate to change: - keppra - lamictal - lyrica - cymbalta  She can crush the following meds. If she notices decreased effect, she should go back to taking whole pills. There is no data on these but likely fine to crush: - elavil - norvasc - capozide - flexeril - norco - ibuprofen/aleve - levaquin - zantac  Ask the neurologist about the oral dissolving tablet of the following. I cannot find if it is a 1:1 conversion and would like her neurologist to weigh in: - lamictal  Thanks!  [name], MD

## 2013-12-19 NOTE — Progress Notes (Signed)
Patient ID: Madison [name], female   DOB: 1976-05-12, 38 y.o.   MRN: [id number] Subjective:   CC: Follow-up blood pressure  HPI:   1. Blood pressure - Pt reports BPs have been in 150s at home due to patin/anxiety, otherwise in 130s/80s at home. She and her oncologist attribute her BP issues to her previous avastin (finished 2011), along with her chronic pain and anxiety. She did not have prior issues with BP. She has also been under lots of stress recently. She has not lost weight since last appt (251lbs from 246 lbs). She also feels mildly bloated and like her feet swell.  2. Imbalance/ataxia - She has been going to neuroPT for her h/o seizures, feeling of dizziness and off-balance. She thinks this is helping significantly with strength, though they are concerned about her gait. Pt reports that her neurologist (Dr Jacelyn Grip at [facility]) does not have good explanation of her gait issues. She will continue PT 2x/week and has spoken with her insurance who state she can continue getting this long-term.  3. Sinusitis - Pt has had issues with this now for >1 month. She took augmentin and has been religiously using netipot, but does not feel this is helping with "fluid behind ears" and head congestion feeling. She denies fevers/chills. She has had sinus tenderness >10 days.  Review of Systems - Per HPI. Additionally, she finds swallowing pills to be difficult and wonders if there are some she can take as dissolvables.  PMH: Medications - reviewed, with changes listed here:  - Klonopin - 1 twice daily (if take not enough, feels like going to have panic attack) - Finished agumentin and took diflucan for yeast in mouth, finished - removed both from list. - Stopped melatonin bc didn't really work. - Taking nasonex daily. Readded to list. - Prilosec doesn't work well, been on 40mg  nexium before which worked - she wants to try this. - Vitamin E not taking because feels it does nothing - MVI not taking bc  makes her nauseated  Objective:  Physical Exam BP 147/96  Pulse 102  Temp(Src) 98.6 F (37 C) (Oral)  Wt 251 lb (113.853 kg) GEN: NAD HEENT: Atraumatic, normocephalic, neck supple, EOMI, sclera clear, TMs retracted with clearish fluid behind bilaterally, no erythema; Frontal and maxillary sinuses tender bilaterally, o/p clear CV: RRR, no murmurs, rubs, or gallops PULM: CTAB, normal effort ABD: Soft, nontender, nondistended SKIN: No rash or cyanosis; warm and well-perfused EXTR: 1+ LE edema bilaterally PSYCH: Mood and affect euthymic, normal rate and volume of speech NEURO: Awake, alert, no focal deficits grossly, normal speech    Assessment:     Madison [name] is a 38 y.o. female here for follow-up of sinusitis, BP, and imbalance.    Plan:     # See problem list and after visit summary for problem-specific plans. - Of note, pt mentioned difficulty swallowing pills: Will review medications and call patient with information on what pills she can get as dissolvable and which she can crush.  # Health Maintenance: Not discussed. - Needs return visit for HM: Address pap (h/o hysterectomy for risk of cancer with breast cancer), TDAP  Follow-up: - Follow up in 3 weeks for f/u of BP and BMET. - F/u in 2 mo for pain management.   [name], MD [place]

## 2013-12-19 NOTE — Telephone Encounter (Signed)
LM for pt to call back. Please inform of message below from Dr. Genia Hotter

## 2013-12-19 NOTE — Patient Instructions (Signed)
It was good to see you today.  For your blood pressure, I have prescribed a new medication (same as previous, but with higher dose of HCTZ, the fluid pill). - Take this daily. - Have your oncologist check your basic metabolic panel when you go on Thursday. - Please also have them check a CBC. - Follow up with me in 3 weeks. - Wear compression hose and keep legs raised.   For your sinusitis, let us try levofloxacin daily for 7 days. - If you develop shortness of breath or concerning symptoms, seek immediate care. - Otherwise, we can follow up on this in 3 weeks.   For your acid reflux, we can try nexium.  Madison Sinclair, MD

## 2013-12-20 ENCOUNTER — Telehealth: Payer: Self-pay | Admitting: Psychology

## 2013-12-20 ENCOUNTER — Ambulatory Visit: Payer: Medicare Other | Admitting: Physical Therapy

## 2013-12-20 DIAGNOSIS — B370 Candidal stomatitis: Secondary | ICD-10-CM | POA: Insufficient documentation

## 2013-12-20 MED ORDER — MOMETASONE FUROATE 50 MCG/ACT NA SUSP: 2.0000 | Freq: Every day | NASAL | Status: DC

## 2013-12-20 NOTE — Assessment & Plan Note (Signed)
Pt reports nexium works better, not well-controlled with omeprazole. - Rx'ed nexium.

## 2013-12-20 NOTE — Assessment & Plan Note (Signed)
Chronic, Only minimal improvement with augmentin, pt continues not feeling well and has sinus tenderness and yellowish-clear fluid behind TMs, afebrile. - Levaquin 500mg  daily x 7 days. - F/u 3 weeks or sooner PRN. Return precau reviewed.

## 2013-12-20 NOTE — Assessment & Plan Note (Signed)
Likely due to augmentin, resolved with diflucan. - Pt to ask oncology to check CBC.

## 2013-12-20 NOTE — Assessment & Plan Note (Signed)
Physical deconditioning likely the cause of imbalance with reported improvement with PT. - Continue PT, which pt thinks she can get long-term coverage from insurance per her discussion with them.

## 2013-12-20 NOTE — Telephone Encounter (Signed)
Had to cancel last appointment due to weather.  Rescheduled for 2/27 at 11:00.

## 2013-12-20 NOTE — Assessment & Plan Note (Deleted)
Chronic, Only minimal improvement with augmentin, pt continues not feeling well and has sinus tenderness and yellowish-clear fluid behind TMs, afebrile. - Levaquin 500mg daily x 7 days. - F/u 3 weeks or sooner PRN. Return precau reviewed.   

## 2013-12-20 NOTE — Assessment & Plan Note (Signed)
Still elevated, reported mild LE edema. BP likely related to anxiety/stress/pain and per pt, thinks related to h/o chemotherapeutic agent. - increase HCTZ to 25ng, continue current lisinopril dose, pt to ask oncology to check BMET Thursday when they gather labs. - Try compression stockings, raise legs to help with edema. - f/u 3 weeks to recheck BMET - rx for compression sleeve left upper extremity and bilateral LE written. - Continue working on weight loss.

## 2013-12-21 ENCOUNTER — Telehealth: Payer: Self-pay | Admitting: *Deleted

## 2013-12-21 NOTE — Telephone Encounter (Signed)
Prior authorization request via fax from [employer] for Nexium DR 40 mg Capsule.  [employer] called at [phone] and authorization form will be faxed with 15 min to 24 hours.  Once form is faxed back, it could take up to 72 hours for an approval per [employer]. Derl Barrow, RN

## 2013-12-22 ENCOUNTER — Ambulatory Visit (HOSPITAL_BASED_OUTPATIENT_CLINIC_OR_DEPARTMENT_OTHER): Payer: Medicare Other

## 2013-12-22 ENCOUNTER — Encounter: Payer: Medicare Other | Attending: Physical Medicine & Rehabilitation

## 2013-12-22 ENCOUNTER — Encounter: Payer: Self-pay | Admitting: Physical Medicine & Rehabilitation

## 2013-12-22 ENCOUNTER — Ambulatory Visit (HOSPITAL_BASED_OUTPATIENT_CLINIC_OR_DEPARTMENT_OTHER): Payer: Medicare Other | Admitting: Physical Medicine & Rehabilitation

## 2013-12-22 VITALS — BP 144/71 | HR 100 | Temp 98.5°F

## 2013-12-22 VITALS — BP 152/92 | HR 108 | Resp 14 | Ht 65.0 in | Wt 250.0 lb

## 2013-12-22 DIAGNOSIS — Z452 Encounter for adjustment and management of vascular access device: Secondary | ICD-10-CM

## 2013-12-22 DIAGNOSIS — G5620 Lesion of ulnar nerve, unspecified upper limb: Secondary | ICD-10-CM

## 2013-12-22 DIAGNOSIS — M79609 Pain in unspecified limb: Secondary | ICD-10-CM | POA: Insufficient documentation

## 2013-12-22 DIAGNOSIS — Z79899 Other long term (current) drug therapy: Secondary | ICD-10-CM | POA: Insufficient documentation

## 2013-12-22 DIAGNOSIS — G894 Chronic pain syndrome: Secondary | ICD-10-CM | POA: Insufficient documentation

## 2013-12-22 DIAGNOSIS — M542 Cervicalgia: Secondary | ICD-10-CM | POA: Insufficient documentation

## 2013-12-22 DIAGNOSIS — C50019 Malignant neoplasm of nipple and areola, unspecified female breast: Secondary | ICD-10-CM

## 2013-12-22 DIAGNOSIS — M76899 Other specified enthesopathies of unspecified lower limb, excluding foot: Secondary | ICD-10-CM | POA: Insufficient documentation

## 2013-12-22 DIAGNOSIS — M25569 Pain in unspecified knee: Secondary | ICD-10-CM | POA: Insufficient documentation

## 2013-12-22 DIAGNOSIS — R209 Unspecified disturbances of skin sensation: Secondary | ICD-10-CM | POA: Insufficient documentation

## 2013-12-22 DIAGNOSIS — Z95828 Presence of other vascular implants and grafts: Secondary | ICD-10-CM

## 2013-12-22 DIAGNOSIS — G5622 Lesion of ulnar nerve, left upper limb: Secondary | ICD-10-CM

## 2013-12-22 DIAGNOSIS — T451X5A Adverse effect of antineoplastic and immunosuppressive drugs, initial encounter: Secondary | ICD-10-CM | POA: Insufficient documentation

## 2013-12-22 DIAGNOSIS — M549 Dorsalgia, unspecified: Secondary | ICD-10-CM | POA: Insufficient documentation

## 2013-12-22 DIAGNOSIS — IMO0001 Reserved for inherently not codable concepts without codable children: Secondary | ICD-10-CM | POA: Insufficient documentation

## 2013-12-22 DIAGNOSIS — M47817 Spondylosis without myelopathy or radiculopathy, lumbosacral region: Secondary | ICD-10-CM | POA: Insufficient documentation

## 2013-12-22 DIAGNOSIS — M25559 Pain in unspecified hip: Secondary | ICD-10-CM | POA: Insufficient documentation

## 2013-12-22 DIAGNOSIS — G620 Drug-induced polyneuropathy: Secondary | ICD-10-CM | POA: Insufficient documentation

## 2013-12-22 MED ORDER — HEPARIN SOD (PORK) LOCK FLUSH 100 UNIT/ML IV SOLN
500.0000 [IU] | Freq: Once | INTRAVENOUS | Status: AC
  Administered 2013-12-22: 500 [IU] via INTRAVENOUS
  Filled 2013-12-22: qty 5

## 2013-12-22 MED ORDER — SODIUM CHLORIDE 0.9 % IJ SOLN
10.0000 mL | INTRAMUSCULAR | Status: DC | PRN
  Administered 2013-12-22: 10 mL via INTRAVENOUS
  Filled 2013-12-22: qty 10

## 2013-12-22 NOTE — Progress Notes (Signed)
EMG performed 12/22/2013.  See EMG report under media tab.

## 2013-12-22 NOTE — Patient Instructions (Signed)
Next visit is for Radiofrequency Right low back

## 2013-12-22 NOTE — Patient Instructions (Signed)

## 2013-12-23 ENCOUNTER — Ambulatory Visit: Payer: Medicare Other | Admitting: Physical Therapy

## 2013-12-26 ENCOUNTER — Telehealth: Payer: Self-pay | Admitting: Family Medicine

## 2013-12-26 NOTE — Telephone Encounter (Signed)
Pt states that she is fine with taking the omeprazole since she is already on this but would like to up the dose if possible. Please advise if she can go up to 40mg  daily. Jazmin Hartsell,CMA

## 2013-12-26 NOTE — Telephone Encounter (Signed)
Please call patient to let her know I had received Prior Auth regarding Nexium - They state it is not covered under Medicare Part D and want me to prescribe omeprazole or another covered drug (but did not provide me with covered drugs). Ask her if she wants to try omeprazole (prilosec) again or for [place] to see what other options there are.  [name], MD

## 2013-12-27 ENCOUNTER — Ambulatory Visit: Payer: Medicare Other | Admitting: Physical Therapy

## 2013-12-27 ENCOUNTER — Ambulatory Visit (INDEPENDENT_AMBULATORY_CARE_PROVIDER_SITE_OTHER): Payer: Medicare Other | Admitting: Psychology

## 2013-12-27 DIAGNOSIS — F3289 Other specified depressive episodes: Secondary | ICD-10-CM

## 2013-12-27 DIAGNOSIS — F329 Major depressive disorder, single episode, unspecified: Secondary | ICD-10-CM

## 2013-12-27 MED ORDER — OMEPRAZOLE 20 MG PO CPDR: 40.0000 mg | DELAYED_RELEASE_CAPSULE | Freq: Every day | ORAL | Status: DC
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
## 2013-12-27 NOTE — Assessment & Plan Note (Signed)
Did not ask about mood symptoms today.  Affect is within normal limits.  Thoughts are clear and goal directed.  Asked her to identify 2-3 goals for therapy.  She is in the process of getting set up with Dr. Reece Levy - a psychiatrist.  Darden Amber thinks she needs medication management.  Will assess symptoms and function next visit.  See patient instructions for further plan.    50 minutes of face to face contact with the patient.

## 2013-12-27 NOTE — Telephone Encounter (Signed)
LMOVM for pt to return call .Dena Esperanza Dawn  

## 2013-12-27 NOTE — Telephone Encounter (Signed)
Md received PA form.  Nexium is not covered under current insurance.  Rx rewritten for omeprazole.  Pt aware of new Rx. Derl Barrow, RN

## 2013-12-27 NOTE — Telephone Encounter (Signed)
Please let her know I have re-written the rx to state this. I want her to take 40mg  daily for 1 month, then decrease to 20mg  daily for 3-5 months. When she has better control, I would like [place] to wean her off and only use it as needed so it remains effective for her and we decrease the potential harmful effects of affecting her bone health.  Thanks.  [name], MD

## 2013-12-27 NOTE — Patient Instructions (Signed)
Please schedule a follow-up for:  2/10 at 11:00.  We can also schedule for the following week - 2/17 at 11:00. It was good to catch up today and figure out what has been going on.  Please identify a few things (2-3) that you would like to work on or that you would like to be different as a result of our meetings.  Bring those next visit.

## 2013-12-27 NOTE — Addendum Note (Signed)
Addended by: Conni Slipper T on: 12/27/2013 09:42 AM   Modules accepted: Orders

## 2013-12-28 ENCOUNTER — Other Ambulatory Visit: Payer: Self-pay | Admitting: Family Medicine

## 2013-12-28 MED ORDER — DULOXETINE HCL 60 MG PO CPEP: ORAL_CAPSULE | ORAL | Status: DC

## 2013-12-29 ENCOUNTER — Ambulatory Visit: Payer: Medicare Other | Admitting: Physical Therapy

## 2014-01-03 ENCOUNTER — Ambulatory Visit: Payer: Medicare Other | Admitting: Physical Therapy

## 2014-01-04 ENCOUNTER — Ambulatory Visit: Payer: Self-pay | Admitting: Family Medicine

## 2014-01-05 ENCOUNTER — Ambulatory Visit: Payer: Medicare Other | Attending: Family Medicine | Admitting: Physical Therapy

## 2014-01-05 DIAGNOSIS — R269 Unspecified abnormalities of gait and mobility: Secondary | ICD-10-CM | POA: Insufficient documentation

## 2014-01-05 DIAGNOSIS — M6281 Muscle weakness (generalized): Secondary | ICD-10-CM | POA: Insufficient documentation

## 2014-01-05 DIAGNOSIS — R5381 Other malaise: Secondary | ICD-10-CM | POA: Insufficient documentation

## 2014-01-05 DIAGNOSIS — IMO0001 Reserved for inherently not codable concepts without codable children: Secondary | ICD-10-CM | POA: Insufficient documentation

## 2014-01-06 ENCOUNTER — Other Ambulatory Visit: Payer: Self-pay | Admitting: *Deleted

## 2014-01-06 DIAGNOSIS — I159 Secondary hypertension, unspecified: Secondary | ICD-10-CM

## 2014-01-06 MED ORDER — CAPTOPRIL-HYDROCHLOROTHIAZIDE 25-25 MG PO TABS: 1.0000 | ORAL_TABLET | Freq: Every day | ORAL | Status: DC

## 2014-01-06 NOTE — Telephone Encounter (Signed)
I had printed this Jan 19th but will resend incase patient did not pick this up. [name], MD

## 2014-01-10 ENCOUNTER — Ambulatory Visit (INDEPENDENT_AMBULATORY_CARE_PROVIDER_SITE_OTHER): Payer: Medicaid Other | Admitting: Psychology

## 2014-01-10 ENCOUNTER — Ambulatory Visit: Payer: Medicare Other | Admitting: Physical Therapy

## 2014-01-10 ENCOUNTER — Encounter: Payer: Self-pay | Admitting: Psychology

## 2014-01-10 DIAGNOSIS — F3289 Other specified depressive episodes: Secondary | ICD-10-CM

## 2014-01-10 DIAGNOSIS — F329 Major depressive disorder, single episode, unspecified: Secondary | ICD-10-CM

## 2014-01-10 DIAGNOSIS — F411 Generalized anxiety disorder: Secondary | ICD-10-CM

## 2014-01-10 NOTE — Progress Notes (Signed)
Madison [name] presents for follow-up.  She is walking with crutches - sounds like part of her neuro PT to help her with balance.  She brought in a long list of "I" statements - goals that she has for herself.

## 2014-01-10 NOTE — Assessment & Plan Note (Addendum)
Describes depressed mood most days of the week.  Reports feeling a mental fatigue as well as a lack of motivation.  Sleep and appetite are disturbed.  Negative self-image with guilt and feelings of worthlessness.  No evidence of SI.  Multiple medical conditions contribute.    Used a CBT conceptualization to get a clearer sense of how her body, thoughts, feelings and behaviors influence one another.  Her goals that she brought in are not very operationalized.  While looking at her thoughts is a reasonable approach, we focused more on behaviors today in hopes of creating some shifts.  Increases her sense of power via setting limits might be helpful.  Also, training her brain to be grateful for things daily can also create a shift in mood. We agreed to continue to use this conceptualization to keep [place] on track.  Touched on smoking - both the positives and the negatives.  She does not want to be a smoker but is not sure she is ready to quit.   50 minutes of face to face counseling.

## 2014-01-10 NOTE — Patient Instructions (Signed)
Please schedule a follow-up for:  February 25th at 10:00. We talked about BEHAVIORAL ways that you might be able to shift how you think and feel about yourself.  The two we focused on today were gratitudes and setting appropriate boundaries. You agreed to think about and potentially write down what you might say to your mother regarding unannounced visits.  You deserve privacy. We touched briefly on smoking and it might be helpful to think about how you would feel when you stop smoking.  What would be different?

## 2014-01-10 NOTE — Assessment & Plan Note (Signed)
Reports fear or worry daily about her physical condition and her daughter.  Smokes more when especially nervous.

## 2014-01-12 ENCOUNTER — Ambulatory Visit: Payer: Medicare Other | Admitting: Physical Therapy

## 2014-01-16 ENCOUNTER — Ambulatory Visit: Payer: Self-pay | Admitting: Family Medicine

## 2014-01-17 ENCOUNTER — Ambulatory Visit: Payer: Medicare Other | Admitting: Physical Therapy

## 2014-01-17 ENCOUNTER — Ambulatory Visit: Payer: Self-pay | Admitting: Psychology

## 2014-01-19 ENCOUNTER — Ambulatory Visit: Admitting: Physical Medicine & Rehabilitation

## 2014-01-19 ENCOUNTER — Telehealth: Payer: Self-pay | Admitting: *Deleted

## 2014-01-19 ENCOUNTER — Ambulatory Visit: Payer: Medicare Other | Admitting: Physical Therapy

## 2014-01-19 NOTE — Telephone Encounter (Signed)
Prior Authorization received from [employer] for Nasonex 50 MCG Nasal Spray. Formulary and PA form placed in provider box for completion. Derl Barrow, RN

## 2014-01-20 ENCOUNTER — Telehealth: Payer: Self-pay | Admitting: *Deleted

## 2014-01-20 MED ORDER — OMEPRAZOLE 20 MG PO CPDR: 40.0000 mg | DELAYED_RELEASE_CAPSULE | Freq: Every day | ORAL | Status: DC

## 2014-01-20 MED ORDER — MOMETASONE FUROATE 50 MCG/ACT NA SUSP: 2.0000 | Freq: Every day | NASAL | Status: DC

## 2014-01-20 MED ORDER — DULOXETINE HCL 20 MG PO CPEP: 20.0000 mg | ORAL_CAPSULE | Freq: Every day | ORAL | Status: DC

## 2014-01-20 MED ORDER — FLUTICASONE PROPIONATE 50 MCG/ACT NA SUSP: 2.0000 | Freq: Every day | NASAL | Status: DC

## 2014-01-20 NOTE — Telephone Encounter (Signed)
Called patient to discuss prior authorization I have received for nasonex.    Nasonex - She reports she has tried flonase (preferred drug) multiple times and it has failed. She has not tried anything else and prefers nasonex to trying anything else. We will try the prior auth but she is aware it may not be approved and states she has some nasonex at home to last her.  Cymbalta - Patient asked if we could increase her cymbalta to better treat her depression. She has a f/u appt with me Monday, so I agreed that she can increase cymbalta by 20mg . She will take cymbalta 20mg  qAM and 60mg  qPM. She denies any side effects from medications and is in agreement with plan.  Omeprazole - Patient also reported needing refill on omeprazole. Will send this in with same sig (40mg  daily x 1 more month, then decrease to 20mg  daily for 5 months, then re-evaluate).  She states her pharmacy has changed to [facility] on [address] in [place], [place].   [name], MD

## 2014-01-20 NOTE — Telephone Encounter (Signed)
PA form for Nasonex 50 mcg nasal spray faxed to [facility] for review.  Derl Barrow, RN

## 2014-01-23 ENCOUNTER — Telehealth: Payer: Self-pay | Admitting: Family Medicine

## 2014-01-23 ENCOUNTER — Ambulatory Visit: Payer: Self-pay | Admitting: Family Medicine

## 2014-01-23 NOTE — Telephone Encounter (Signed)
Partnerships for [facility] called and wanted the doctor to order a tub bench for [facility] and also put in orders for a personal care services since her condition is getting worse. jw

## 2014-01-24 ENCOUNTER — Ambulatory Visit: Payer: Medicare Other | Admitting: Physical Therapy

## 2014-01-25 ENCOUNTER — Ambulatory Visit: Payer: Self-pay | Admitting: Psychology

## 2014-01-26 ENCOUNTER — Ambulatory Visit: Payer: Medicare Other | Admitting: Physical Therapy

## 2014-01-30 NOTE — Telephone Encounter (Signed)
Will forward to MD.  Pt was due to be seen 01/23/14 but no showed appt to discuss medication changes.  Deana Krock,CMA

## 2014-01-30 NOTE — Telephone Encounter (Signed)
When she picked up RX for Cymbalta, it was for 20 mg. It was suppose to be for 60. Please let pt know when it is corrected.

## 2014-01-31 ENCOUNTER — Ambulatory Visit: Payer: Medicare Other | Attending: Family Medicine | Admitting: Physical Therapy

## 2014-01-31 DIAGNOSIS — IMO0001 Reserved for inherently not codable concepts without codable children: Secondary | ICD-10-CM | POA: Insufficient documentation

## 2014-01-31 DIAGNOSIS — R5381 Other malaise: Secondary | ICD-10-CM | POA: Insufficient documentation

## 2014-01-31 DIAGNOSIS — M6281 Muscle weakness (generalized): Secondary | ICD-10-CM | POA: Insufficient documentation

## 2014-01-31 DIAGNOSIS — R269 Unspecified abnormalities of gait and mobility: Secondary | ICD-10-CM | POA: Insufficient documentation

## 2014-01-31 NOTE — Telephone Encounter (Signed)
I am not sure if the pt knows about the denial, since I didn't get the fax.  The pt should receive a letter from [facility] about the denial.  If you want to complete the form for predetermination you can or change the medication.  Derl Barrow, RN

## 2014-01-31 NOTE — Telephone Encounter (Signed)
Re: PA for nasonex, received fax from [facility] titled "notice of denial of medicare prescription drug coverage" with a "request for redetermination" form. [name], do I do anything with this and does the patient know nasonex was not approved? [name], MD

## 2014-02-02 ENCOUNTER — Ambulatory Visit: Payer: Medicare Other | Admitting: Physical Therapy

## 2014-02-02 NOTE — Telephone Encounter (Signed)
OK I have written a prescription for personal care services ie [facility] and a tub bench and placed it up front. She can pick it up.   [name], MD

## 2014-02-03 ENCOUNTER — Telehealth: Payer: Self-pay | Admitting: Family Medicine

## 2014-02-03 NOTE — Telephone Encounter (Signed)
Pt is aware of message per Eusebio Friendly.  Jazmin Hartsell,CMA

## 2014-02-03 NOTE — Telephone Encounter (Signed)
Called and spoke with [facility] and gave diagnoses codes including cervicalgia and physical deconditioning. She states she will call patient to let her know she can pick it up.  [name], MD

## 2014-02-03 NOTE — Telephone Encounter (Signed)
Pt called back because the fax that was sent needs to have the diagnoses code attached. Can we refax with this information. jw

## 2014-02-03 NOTE — Telephone Encounter (Signed)
[facility] called. Need diagnosis code for the tub bench [phone] ext [phone]

## 2014-02-03 NOTE — Telephone Encounter (Signed)
Pt called about tub bench. Was hoping to have it for the weekend Please advise

## 2014-02-03 NOTE — Telephone Encounter (Signed)
LM for pt to call back.  Please give message from MD. Thanks  Johnney Ou

## 2014-02-03 NOTE — Telephone Encounter (Signed)
Pt called back to request rx for PCA and a tub bench be faxed to [facility] at [phone].  Fax sent.and original script attached to cover sheet.

## 2014-02-06 NOTE — Telephone Encounter (Addendum)
Re: Cymbalta - On telephone encounter 2/19, patient asked to increase cymbalta to better treat depression. At that time she had appt scheduled 2/23, so I agreed we could increase by 20mg , making new sig take cymbalta 20mg  qAM and 60mg  qPM. She denied any side effects and agreed w/plan. She had already had active order for 60mg  qPM to [facility], filled 12/28/13 for 6 months, so I sent the other 20mg  in and it looks like it went to [facility] on [address] in [place]. Please clarify this with her and ask if there is anything else she needs.  Re: Nasonex, patient could call insurance and have them let her know what alternatives they will cover, and let me know that. I am happy to change rx.  ThxHilton Sinclair, MD

## 2014-02-07 ENCOUNTER — Ambulatory Visit: Payer: Medicare Other | Admitting: Physical Therapy

## 2014-02-09 ENCOUNTER — Ambulatory Visit: Payer: Medicare Other | Admitting: Physical Therapy

## 2014-02-09 NOTE — Telephone Encounter (Signed)
Left message for pt regarding Dr. Dianah Field notes from 02/06/2014.  If pt calls back see note from Dr. Salem Senate, Rosine Beat, RN

## 2014-02-10 ENCOUNTER — Telehealth: Payer: Self-pay | Admitting: *Deleted

## 2014-02-10 DIAGNOSIS — R5381 Other malaise: Secondary | ICD-10-CM

## 2014-02-10 DIAGNOSIS — F3289 Other specified depressive episodes: Secondary | ICD-10-CM

## 2014-02-10 DIAGNOSIS — G8929 Other chronic pain: Secondary | ICD-10-CM

## 2014-02-10 DIAGNOSIS — F329 Major depressive disorder, single episode, unspecified: Secondary | ICD-10-CM

## 2014-02-10 DIAGNOSIS — R296 Repeated falls: Secondary | ICD-10-CM

## 2014-02-10 DIAGNOSIS — F411 Generalized anxiety disorder: Secondary | ICD-10-CM

## 2014-02-10 DIAGNOSIS — R569 Unspecified convulsions: Secondary | ICD-10-CM

## 2014-02-10 NOTE — Telephone Encounter (Signed)
Patient had a referral for [facility].  Thinks patient was referred to [facility].  [facility] states that initial referral and assessment has to be done by [facility] care.  After assessment, Janeece Riggers will make recommendations on length and type of services needed.  Will need to change referral to [facility].  Will route request to Dr. Dianah Field.  Burna Forts, BSN, RN-BC

## 2014-02-13 ENCOUNTER — Telehealth: Payer: Self-pay | Admitting: *Deleted

## 2014-02-13 ENCOUNTER — Encounter: Payer: Self-pay | Admitting: Family Medicine

## 2014-02-13 ENCOUNTER — Ambulatory Visit (INDEPENDENT_AMBULATORY_CARE_PROVIDER_SITE_OTHER): Payer: Medicare Other | Admitting: Family Medicine

## 2014-02-13 VITALS — BP 158/84 | HR 121 | Temp 98.6°F | Ht 65.0 in | Wt 256.1 lb

## 2014-02-13 DIAGNOSIS — R000 Tachycardia, unspecified: Secondary | ICD-10-CM

## 2014-02-13 DIAGNOSIS — H60399 Other infective otitis externa, unspecified ear: Secondary | ICD-10-CM

## 2014-02-13 DIAGNOSIS — I158 Other secondary hypertension: Secondary | ICD-10-CM

## 2014-02-13 DIAGNOSIS — F411 Generalized anxiety disorder: Secondary | ICD-10-CM

## 2014-02-13 DIAGNOSIS — I159 Secondary hypertension, unspecified: Secondary | ICD-10-CM

## 2014-02-13 LAB — BASIC METABOLIC PANEL
BUN: 10 mg/dL (ref 6–23)
CHLORIDE: 96 meq/L (ref 96–112)
CO2: 30 meq/L (ref 19–32)
CREATININE: 0.7 mg/dL (ref 0.50–1.10)
Calcium: 10.1 mg/dL (ref 8.4–10.5)
Glucose, Bld: 108 mg/dL — ABNORMAL HIGH (ref 70–99)
POTASSIUM: 3.8 meq/L (ref 3.5–5.3)
Sodium: 139 mEq/L (ref 135–145)

## 2014-02-13 MED ORDER — HYDROCODONE-ACETAMINOPHEN 10-325 MG PO TABS: 1.0000 | ORAL_TABLET | Freq: Four times a day (QID) | ORAL | Status: DC | PRN

## 2014-02-13 MED ORDER — NEOMYCIN-COLIST-HC-THONZONIUM 3.3-3-10-0.5 MG/ML OT SUSP: 3.0000 [drp] | Freq: Four times a day (QID) | OTIC | Status: DC

## 2014-02-13 NOTE — Telephone Encounter (Signed)
Received alternative medication request for Cortisportin-TC Ear Susp is not covered under pt's insurance.  Called [employer] to check formulary, no other medication is listed and a PA is required.  PA placed in provider box for review.  Derl Barrow, RN

## 2014-02-13 NOTE — Progress Notes (Signed)
Patient ID: Madison [name], female   DOB: November 20, 1976, 38 y.o.   MRN: [id number] Subjective:   CC: Follow up BP  HPI:   BP - Took captopril-HCTZ this morning. Takes religiously. No change from increasing HCTZ. BPs at home 140-160. Norvasc 10. No chest pain, SOB. Still leg swelling and compression stockings help. Have had double vision that lasts minutes and goes away on own, none now. Happens more with dizziness.  Anxiety - Feeling anxiety now about "everything" worse than usual. Seeing Dr Gwenlyn Saran regularly, likes appts with her. Not so sure cymbalta or klonopin are working. Going to PT which is good. [facility] a few times and thinks she may have had a seizure during those. Does not see a psychiatrist. Sees me, Dr Jacelyn Grip (neuro) and Dr Gwenlyn Saran. Cannot find a psychiatrist who will see her bc of meds. Has a CM who has been looking with her.   Ear canal - Discomfort that is mild. No fevers/chills reported and no purulence.  Review of Systems - Per HPI. Additionally, reports bilateral rib pain worse when she pushes on it.   PMH: - Lamictal - dose increased recently by Dr Jacelyn Grip, can't recall dose. Tolerates, though initially felt 'loopy.'  Objective:  Physical Exam BP 158/84  Pulse 121  Temp(Src) 98.6 F (37 C) (Oral)  Ht 5\' 5"  (1.651 m)  Wt 256 lb 1.6 oz (116.166 kg)  BMI 42.62 kg/m2 GEN: Seated in exam room with arm brace crutches next to her HEENT: Right TM with 65mm spot of blood, TM itself intact and clear; left TM clear CV: RRR tachycardic in 110s, on recheck 100s, bilateral 2+ LE edema to mid-shin PULM: CTAB, normal effort PSYCH: Mood and affect both mildly depressed  Assessment:     Madison [name] is a 38 y.o. female here for f/u of BP.    Plan:     # See problem list and after visit summary for problem-specific plans. - Of note, pt to return to discuss anxiety in 1 week. - Rib pain - Did not discuss. Chronic. ?MSK vs part of her chronic pain syndrome.  # Health maintenance - need  to discuss TDAP and pap.   Follow-up: Follow up in 1 week for f/u anxiety/depression and ear symptoms.   [name], MD [place]

## 2014-02-13 NOTE — Patient Instructions (Signed)
Good to see you today.  I think your ear canal caused the discomfort and small amount of blood we saw today. Try cortisporin ear drops for 1 week and come back to see me.  Let us discuss your depression and anxiety in 1 week too. If I am not available, you can see anyone. The important thing is you are seen and can discuss management. Continue working on controlling the stress in your life. Also, try to get an earlier appt with Dr Gwenlyn Saran if possible.   For your BP, continue current management and we will check your labs today.  [name], MD  Stress Stress-related medical problems are becoming increasingly common. The body has a built-in physical response to stressful situations. Faced with pressure, challenge or danger, we need to react quickly. Our bodies release hormones such as cortisol and adrenaline to help do this. These hormones are part of the "fight or flight" response and affect the metabolic rate, heart rate and blood pressure, resulting in a heightened, stressed state that prepares the body for optimum performance in dealing with a stressful situation. It is likely that early man required these mechanisms to stay alive, but usually modern stresses do not call for this, and the same hormones released in today's world can damage health and reduce coping ability. CAUSES  Pressure to perform at work, at school or in sports.  Threats of physical violence.  Money worries.  Arguments.  Family conflicts.  Divorce or separation from significant other.  Bereavement.  New job or unemployment.  Changes in location.  Alcohol or drug abuse. SOMETIMES, THERE IS NO PARTICULAR REASON FOR DEVELOPING STRESS. Almost all people are at risk of being stressed at some time in their lives. It is important to know that some stress is temporary and some is long term.  Temporary stress will go away when a situation is resolved. Most people can cope with short periods of stress, and  it can often be relieved by relaxing, taking a walk, chatting through issues with friends, or having a good night's sleep.  Chronic (long-term, continuous) stress is much harder to deal with. It can be psychologically and emotionally damaging. It can be harmful both for an individual and for friends and family. SYMPTOMS Everyone reacts to stress differently. There are some common effects that help [place] recognize it. In times of extreme stress, people may:  Shake uncontrollably.  Breathe faster and deeper than normal (hyperventilate).  Vomit.  For people with asthma, stress can trigger an attack.  For some people, stress may trigger migraine headaches, ulcers, and body pain. PHYSICAL EFFECTS OF STRESS MAY INCLUDE:  Loss of energy.  Skin problems.  Aches and pains resulting from tense muscles, including neck ache, backache and tension headaches.  Increased pain from arthritis and other conditions.  Irregular heart beat (palpitations).  Periods of irritability or anger.  Apathy or depression.  Anxiety (feeling uptight or worrying).  Unusual behavior.  Loss of appetite.  Comfort eating.  Lack of concentration.  Loss of, or decreased, sex-drive.  Increased smoking, drinking, or recreational drug use.  For women, missed periods.  Ulcers, joint pain, and muscle pain. Post-traumatic stress is the stress caused by any serious accident, strong emotional damage, or extremely difficult or violent experience such as rape or war. Post-traumatic stress victims can experience mixtures of emotions such as fear, shame, depression, guilt or anger. It may include recurrent memories or images that may be haunting. These feelings can last for weeks,  months or even years after the traumatic event that triggered them. Specialized treatment, possibly with medicines and psychological therapies, is available. If stress is causing physical symptoms, severe distress or making it difficult for you  to function as normal, it is worth seeing your caregiver. It is important to remember that although stress is a usual part of life, extreme or prolonged stress can lead to other illnesses that will need treatment. It is better to visit a doctor sooner rather than later. Stress has been linked to the development of high blood pressure and heart disease, as well as insomnia and depression. There is no diagnostic test for stress since everyone reacts to it differently. But a caregiver will be able to spot the physical symptoms, such as:  Headaches.  Shingles.  Ulcers. Emotional distress such as intense worry, low mood or irritability should be detected when the doctor asks pertinent questions to identify any underlying problems that might be the cause. In case there are physical reasons for the symptoms, the doctor may also want to do some tests to exclude certain conditions. If you feel that you are suffering from stress, try to identify the aspects of your life that are causing it. Sometimes you may not be able to change or avoid them, but even a small change can have a positive ripple effect. A simple lifestyle change can make all the difference. STRATEGIES THAT CAN HELP DEAL WITH STRESS:  Delegating or sharing responsibilities.  Avoiding confrontations.  Learning to be more assertive.  Regular exercise.  Avoid using alcohol or street drugs to cope.  Eating a healthy, balanced diet, rich in fruit and vegetables and proteins.  Finding humor or absurdity in stressful situations.  Never taking on more than you know you can handle comfortably.  Organizing your time better to get as much done as possible.  Talking to friends or family and sharing your thoughts and fears.  Listening to music or relaxation tapes.  Tensing and then relaxing your muscles, starting at the toes and working up to the head and neck. If you think that you would benefit from help, either in identifying the things  that are causing your stress or in learning techniques to help you relax, see a caregiver who is capable of helping you with this. Rather than relying on medications, it is usually better to try and identify the things in your life that are causing stress and try to deal with them. There are many techniques of managing stress including counseling, psychotherapy, aromatherapy, yoga, and exercise. Your caregiver can help you determine what is best for you. Document Released: 02/07/2003 Document Revised: 02/09/2012 Document Reviewed: 01/04/2008 [facility] Patient Information 2014 [place], [place].

## 2014-02-14 ENCOUNTER — Ambulatory Visit: Payer: Medicare Other | Admitting: Physical Therapy

## 2014-02-15 DIAGNOSIS — H60399 Other infective otitis externa, unspecified ear: Secondary | ICD-10-CM | POA: Insufficient documentation

## 2014-02-15 NOTE — Telephone Encounter (Signed)
Filled out and will place in [name]'s office today.  [name], MD

## 2014-02-15 NOTE — Telephone Encounter (Signed)
PA form for Cortisporin-TC Ear Susp faxed to [facility] for review.  Derl Barrow, RN

## 2014-02-16 ENCOUNTER — Encounter: Payer: Self-pay | Admitting: Family Medicine

## 2014-02-16 ENCOUNTER — Ambulatory Visit: Payer: Medicare Other | Admitting: Physical Therapy

## 2014-02-17 NOTE — Telephone Encounter (Signed)
PA for Cortisporin-TC Ear Susp is approved.  [place] will continue to coverage for 12/01/13-11/30/14 as long as pt is in Medicare Part D prescription drug plan, provider continues to prescribe drug and drug is safe for treatment.  [facility] pharmacy informed of approval.  Derl Barrow, RN

## 2014-02-17 NOTE — Telephone Encounter (Signed)
Called [employer] to check status of PA.  PA pending.  Derl Barrow, RN

## 2014-02-20 ENCOUNTER — Telehealth: Payer: Self-pay | Admitting: Family Medicine

## 2014-02-20 MED ORDER — NEOMYCIN-POLYMYXIN-HC 3.5-10000-1 OT SUSP: 3.0000 [drp] | Freq: Four times a day (QID) | OTIC | Status: DC

## 2014-02-20 NOTE — Telephone Encounter (Signed)
Pt states that she has already picked up medication.  She would like to know why her cymbalta was changed from 60mg  to 20mg  daily.  Please advise. Dianara Smullen,CMA

## 2014-02-20 NOTE — Telephone Encounter (Signed)
Message from telephone note dated 2/20, copied here: Re: Cymbalta - On telephone encounter 2/19, patient asked to increase cymbalta to better treat depression. At that time she had appt scheduled 2/23, so I agreed we could increase by 20mg , making new sig take cymbalta 20mg  qAM and 60mg  qPM. She denied any side effects and agreed w/plan. She had already had active order for 60mg  qPM to [facility], filled 12/28/13 for 6 months, so I sent the other 20mg  in and it looks like it went to [facility] in [place]. Please clarify this with her and ask if there is anything else she needs.    [name] left message for pt to call back but we did not hear back.

## 2014-02-20 NOTE — Telephone Encounter (Signed)
Please let patient know I have prescribed the formulary alternative for cortisporin TC suspension (which is neomycin/polymyxin/hydrocortisone otic suspension). I have sent the new rx to her pharmacy.  [name], MD

## 2014-02-20 NOTE — Telephone Encounter (Signed)
Spoke with patient and she has only been taking the 20mg  due to pharmacy not filling the 60mg .  Called pharmacist and he states that the last time mother picked up rx for patient she had them cancel the 60mg  rx.  So patient has not been taking this dose at all.  She also has no recent rx on file due to it being cancelled.  Can you please send over a new rx for the 60mg  tabs and the 20mg  tabs?  Thanks [employer]

## 2014-02-21 ENCOUNTER — Ambulatory Visit: Payer: Medicare Other | Admitting: Physical Therapy

## 2014-02-21 ENCOUNTER — Encounter: Payer: Self-pay | Admitting: Physical Medicine & Rehabilitation

## 2014-02-21 ENCOUNTER — Encounter: Payer: Medicare Other | Attending: Physical Medicine & Rehabilitation

## 2014-02-21 ENCOUNTER — Ambulatory Visit (HOSPITAL_BASED_OUTPATIENT_CLINIC_OR_DEPARTMENT_OTHER): Payer: Medicare Other | Admitting: Physical Medicine & Rehabilitation

## 2014-02-21 VITALS — BP 132/82 | HR 113 | Resp 14 | Ht 65.0 in | Wt 260.0 lb

## 2014-02-21 DIAGNOSIS — G620 Drug-induced polyneuropathy: Secondary | ICD-10-CM | POA: Insufficient documentation

## 2014-02-21 DIAGNOSIS — M25559 Pain in unspecified hip: Secondary | ICD-10-CM | POA: Insufficient documentation

## 2014-02-21 DIAGNOSIS — M25569 Pain in unspecified knee: Secondary | ICD-10-CM | POA: Insufficient documentation

## 2014-02-21 DIAGNOSIS — Z79899 Other long term (current) drug therapy: Secondary | ICD-10-CM | POA: Insufficient documentation

## 2014-02-21 DIAGNOSIS — M76899 Other specified enthesopathies of unspecified lower limb, excluding foot: Secondary | ICD-10-CM | POA: Insufficient documentation

## 2014-02-21 DIAGNOSIS — M542 Cervicalgia: Secondary | ICD-10-CM | POA: Insufficient documentation

## 2014-02-21 DIAGNOSIS — M47817 Spondylosis without myelopathy or radiculopathy, lumbosacral region: Secondary | ICD-10-CM | POA: Insufficient documentation

## 2014-02-21 DIAGNOSIS — R209 Unspecified disturbances of skin sensation: Secondary | ICD-10-CM | POA: Insufficient documentation

## 2014-02-21 DIAGNOSIS — R000 Tachycardia, unspecified: Secondary | ICD-10-CM | POA: Insufficient documentation

## 2014-02-21 DIAGNOSIS — T451X5A Adverse effect of antineoplastic and immunosuppressive drugs, initial encounter: Secondary | ICD-10-CM | POA: Insufficient documentation

## 2014-02-21 DIAGNOSIS — M79609 Pain in unspecified limb: Secondary | ICD-10-CM | POA: Insufficient documentation

## 2014-02-21 DIAGNOSIS — G894 Chronic pain syndrome: Secondary | ICD-10-CM | POA: Insufficient documentation

## 2014-02-21 DIAGNOSIS — IMO0001 Reserved for inherently not codable concepts without codable children: Secondary | ICD-10-CM | POA: Insufficient documentation

## 2014-02-21 DIAGNOSIS — M549 Dorsalgia, unspecified: Secondary | ICD-10-CM | POA: Insufficient documentation

## 2014-02-21 MED ORDER — DULOXETINE HCL 60 MG PO CPEP: ORAL_CAPSULE | ORAL | Status: DC

## 2014-02-21 MED ORDER — GABAPENTIN 600 MG PO TABS: 600.0000 mg | ORAL_TABLET | Freq: Three times a day (TID) | ORAL | Status: DC

## 2014-02-21 NOTE — Assessment & Plan Note (Signed)
Elevated today, likely stress related as well as hemodynamic. Consistently high.  - Continue current management (prev visit increased HCTZ component of captopril-HCTZ to 25mg , norvasc 10mg )) - Nurse visit for BP check; Bring cuff so we can calibrate it. - BMET today>>K and Cr WNL. - Continue to work on weight loss.

## 2014-02-21 NOTE — Addendum Note (Signed)
Addended by: Conni Slipper T on: 02/21/2014 02:52 PM   Modules accepted: Orders

## 2014-02-21 NOTE — Progress Notes (Signed)
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  Informed consent was obtained after describing risks and benefits of the procedure with the patient, this includes bleeding, bruising, infection, paralysis and medication side effects. The patient wishes to proceed and has given written consent. The patient was placed in a prone position. The lumbar and sacral area was marked and prepped with Betadine. A 25-gauge 1-1/2 inch needle was inserted into the skin and subcutaneous tissue at 3 sites in one ML of 1% lidocaine was injected into each site. Then a 20-gauge 15 cm radio frequency needle with a 1 cm curved active tip was inserted targeting the Right S1 SAP/sacral ala junction. Bone contact was made and confirmed with lateral imaging. Sensory stimulation at 50 Hz followed by motor stimulation at 2 Hz confirm proper needle location followed by injection of one ML of the solution containing one ML of 4 mg per mL dexamethasone and 3 mL of 1% MPF lidocaine. Then the Right L5 SAP/transverse process junction was targeted. Bone contact was made and confirmed with lateral imaging. Sensory stimulation at 50 Hz followed by motor stimulation at 2 Hz confirm proper needle location followed by injection of one ML of the solution containing one ML of 4 mg per mL dexamethasone and 3 mL of 1% MPF lidocaine. Then the Right L4 SAP/transverse process junction was targeted. Bone contact was made and confirmed with lateral imaging. Sensory stimulation at 50 Hz followed by motor stimulation at 2 Hz confirm proper needle location followed by injection of one ML of the solution containing one ML of 4 mg per mL dexamethasone and 3 mL of 1% MPF lidocaine. Radio frequency lesion being at [facility] for 90 seconds  was performed. Needles were removed. Post procedure instructions and vital signs were performed. Patient tolerated procedure well. Followup appointment was given.  May use 10 cm next RF

## 2014-02-21 NOTE — Telephone Encounter (Signed)
She can take the full 60 but if she notices side effects I would drop down.   [name], MD

## 2014-02-21 NOTE — Telephone Encounter (Signed)
Will send rx for 60mg  cymbalta to [facility] in [place]. Please let patient know I am sending this now but since she has presumably not been taking that dose for some time, I want her to increase to just 60mg  and then add additional 20mg  after 1 week.   Thanks.  [name], MD

## 2014-02-21 NOTE — Patient Instructions (Signed)

## 2014-02-21 NOTE — Assessment & Plan Note (Signed)
Possibly recent or current otitis externa, blood appears not to be from TM as this is intact, and is likely from canal. - Cortisporin ear drops x 1 week. - F/u in 1 week.

## 2014-02-21 NOTE — Assessment & Plan Note (Signed)
Pulse elevated last few weeks. Possible contribution from anxiety. Last TSH normal 03/2012. Hgb normal 01/2013. BMET normal today's check. Chronic MSK-sounding chest pain. Cardiac exam regular rhythm. - Pulse 107 on recheck - Has f/u with me 3/26 - if still tachycardic, will need further eval / EKG.

## 2014-02-21 NOTE — Telephone Encounter (Signed)
She would benefit from [facility] and [facility] RN.  Constance Holster will help by starting the application for PCS and I will complete it once she is done.  I am placing the order now for [facility] RN that Constance Holster can fax to home health agencies for me.  Thank you!  [name], MD

## 2014-02-21 NOTE — Telephone Encounter (Signed)
Pt has already been taking 20mg  daily.  She just needed a rx for the 60mg  to take at night.  So can she just start taking the full 60 or does she need to build up again?  Eisa Conaway,CMA

## 2014-02-21 NOTE — Assessment & Plan Note (Signed)
Worsened per report.  - Return in 1 week to discuss anx/depression. - CM helping arrange psychiatrist (per pt, Dr Reece Levy). - Discussed continuing to work on controlling stress in life. - Get earlier appt with Dr Gwenlyn Saran if possible.

## 2014-02-21 NOTE — Progress Notes (Signed)
  PROCEDURE RECORD [facility]   Name: Madison [name] DOB:05-12-1976 MRN: [id number]  Date:02/21/2014  Physician: Alysia Penna, MD    Nurse/[name]: Vonna Drafts [name]  Allergies:  Allergies  Allergen Reactions  . Taxol [Paclitaxel]   . Tramadol   . Ultram [Tramadol Hcl]     Consent Signed: yes  Is patient diabetic? no  CBG today? na  Pregnant: no LMP: No LMP recorded. Patient has had a hysterectomy. (age 40-55)  Anticoagulants: no Anti-inflammatory: no Antibiotics: no  Procedure: Right L3-4-5 Radiofrequency  Position: Prone Start Time: 159 End Time: 226 Fluoro Time: 41  RN/[name] Madison [name] [name] Madison [name]    Time 1:25 228    BP 132/82 154/83    Pulse 113 109    Respirations 14 14    O2 Sat 95 94    S/S 6 6    Pain Level 7/10 7/10     D/C home with dad, patient A & O X 3, D/C instructions reviewed, and sits independently.

## 2014-02-22 ENCOUNTER — Telehealth: Payer: Self-pay | Admitting: Family Medicine

## 2014-02-22 NOTE — Telephone Encounter (Signed)
CSW has placed PCS form in PCP's box and has hard faxed the [facility] referral to [place].  Hunt Oris, MSW, [place]

## 2014-02-22 NOTE — Telephone Encounter (Signed)
Pt rescheduled her appt to April 9 because she had not been on the ear drops a week yet. She stated Dr T wanted her to use the ear drops a week before coming in to be checked She just wanted someone to know

## 2014-02-22 NOTE — Telephone Encounter (Signed)
Pt is aware of this.  Will see [place] tomorrow for an appt. Jazmin Hartsell,CMA

## 2014-02-22 NOTE — Telephone Encounter (Signed)
n

## 2014-02-23 ENCOUNTER — Ambulatory Visit: Payer: Self-pay | Admitting: Family Medicine

## 2014-02-23 ENCOUNTER — Ambulatory Visit: Payer: Medicare Other | Admitting: Physical Therapy

## 2014-02-27 ENCOUNTER — Ambulatory Visit: Payer: Medicare Other | Admitting: Physical Therapy

## 2014-02-28 NOTE — Telephone Encounter (Signed)
Filling out and placing on Norma's desk. Thank you!  [name], MD

## 2014-02-28 NOTE — Telephone Encounter (Signed)
Clinical Education officer, museum (CSW) informed pt that the application for PCS services has been faxed to [employer]. Pt very appreciative.  Hunt Oris, MSW, [place]

## 2014-03-01 ENCOUNTER — Telehealth: Payer: Self-pay | Admitting: Clinical

## 2014-03-01 ENCOUNTER — Ambulatory Visit: Payer: Medicare Other | Admitting: Physical Therapy

## 2014-03-01 NOTE — Telephone Encounter (Signed)
Clinical Education officer, museum (CSW) received a call from [place] at [facility] inquiring whether she could receive an order for pt to receive services from ACT Team. 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  Hunt Oris, MSW, [place]

## 2014-03-06 ENCOUNTER — Encounter: Payer: Self-pay | Admitting: Family Medicine

## 2014-03-06 NOTE — Progress Notes (Signed)
Patient ID: Madison [name], female   DOB: May 03, 1976, 38 y.o.   MRN: [id number]  Medication review -  - Duplicate duloxetine rx written to increase overall dose, along with amitriptyline. - Anastrozole not recommended in premenopausal females.  --- Per onc note 10/18/13, plan to continue anastrozole for 10 years (until 03/2021). - Clonazepam, Lamotrigine, and Levitiracetam - duplicate anticonvulsant therapy  Tasks to complete at next visit: - Will consolidate duloxetine rx if patient has tolerated increased dose (or keep as is if breaking it up helps with sleep). - Review anxiolytic and antiepileptic medications with patient.  [name], MD

## 2014-03-09 ENCOUNTER — Telehealth: Payer: Self-pay | Admitting: Family Medicine

## 2014-03-09 ENCOUNTER — Encounter: Payer: Self-pay | Admitting: Family Medicine

## 2014-03-09 ENCOUNTER — Ambulatory Visit (INDEPENDENT_AMBULATORY_CARE_PROVIDER_SITE_OTHER): Payer: Medicare Other | Admitting: Family Medicine

## 2014-03-09 ENCOUNTER — Other Ambulatory Visit: Payer: Self-pay

## 2014-03-09 ENCOUNTER — Ambulatory Visit (HOSPITAL_COMMUNITY)
Admission: RE | Admit: 2014-03-09 | Discharge: 2014-03-09 | Disposition: A | Discharge status: Home / Self Care | Payer: Medicare Other | Source: Ambulatory Visit | Attending: Family Medicine | Admitting: Family Medicine

## 2014-03-09 VITALS — BP 142/75 | HR 116 | Temp 98.2°F | Ht 65.5 in | Wt 260.0 lb

## 2014-03-09 DIAGNOSIS — I498 Other specified cardiac arrhythmias: Secondary | ICD-10-CM

## 2014-03-09 DIAGNOSIS — H60399 Other infective otitis externa, unspecified ear: Secondary | ICD-10-CM

## 2014-03-09 DIAGNOSIS — R399 Unspecified symptoms and signs involving the genitourinary system: Secondary | ICD-10-CM

## 2014-03-09 DIAGNOSIS — R3989 Other symptoms and signs involving the genitourinary system: Secondary | ICD-10-CM

## 2014-03-09 DIAGNOSIS — R000 Tachycardia, unspecified: Secondary | ICD-10-CM

## 2014-03-09 DIAGNOSIS — G8929 Other chronic pain: Secondary | ICD-10-CM

## 2014-03-09 LAB — POCT URINALYSIS DIPSTICK
BILIRUBIN UA: NEGATIVE
Glucose, UA: NEGATIVE
KETONES UA: NEGATIVE
Leukocytes, UA: NEGATIVE
Nitrite, UA: NEGATIVE
PH UA: 7
Protein, UA: NEGATIVE
Spec Grav, UA: 1.02
Urobilinogen, UA: 0.2

## 2014-03-09 LAB — POCT UA - MICROSCOPIC ONLY

## 2014-03-09 MED ORDER — HYDROCODONE-ACETAMINOPHEN 10-325 MG PO TABS: 1.0000 | ORAL_TABLET | Freq: Four times a day (QID) | ORAL | Status: DC | PRN

## 2014-03-09 NOTE — Patient Instructions (Signed)
Good to see you today.  For your pain, - Start small with activity. - Walk daily, especially with the nice weather! - I am refilling vicodin. Do not take it if you do not need it. - Let us touch base in 1 month about the pain, and we will plan to prescribe the least amount needed. - Continue to follow up with the pain center. - Pain and anxiety are linked. Let us plan to discuss your anxiety.  For your back pain: - I will call you with the results.  For your fast heart rate: - I want you to follow up with Dr Valentina Lucks in pharmacy clinic to discuss this and your high BP.  Let us follow up in 2w-54month.  [name], MD

## 2014-03-09 NOTE — Telephone Encounter (Addendum)
UTI - UA negative for infection. Called and spoke with patient, who is still having frequency/ urgency and reports h/o frequent UTIs.  She is electing to treat due to symptoms and concern. On review of previous cultures, culture in 2012 was pan-sensitive. Will treat with macrobid 100mg  BID x 7 days.  Reflux -  Patient also requests refill on omeprazole. On review, she is due to have dose decreased. Wrote at decreased dose of 20mg  daily.  Multiple issues - Pt is concerned at the number of things seeming to be going wrong and that she is just "falling apart." We decided on regular office visits for now so we can continue to address issues regularly.   [name], MD

## 2014-03-09 NOTE — Progress Notes (Signed)
Patient ID: Madison [name], female   DOB: 09/10/1976, 38 y.o.   MRN: [id number] Subjective:   CC: Chronic pain, concern for UTI, f/u otitis externa  HPI:   Patient comes in today to discuss multiple issues.  Chronic pain - Pain is mostly in bilateral lower back and shoulders. She was recently told by home health nurse not to take NSAID due to chance of "interaction with another med." She takes tylenol as needed but it is not enough. She had radiofrequency ablation 2 weeks ago by [facility] which has helped but pain has shifted into left posterior leg (sciatica). Takes vicodin 1-1.5 tabs daily and lyrica. Has had multiple steroid injections for bulging/herniated disks.  Back pain - Feels some increase in back pain and urinary urgency but has hesitant stream. No increased fever/chills compared to baseline. Urine is cloudy but nonbloody. Sometimes has "broken up tissue in it" though sample she gave does not. Denies dysuria. Concerned given h/o UTIs.  Otitis externa - Blood on TM and EAM irritation at prior visit. Patient has been taking cortisporin otic solution. Reports ear/sinuses still itchy and sinus still draining, but does not want to use any nasal steroid. Uses neti pot which helps. Denies new fevers or ear pain.  Tachycardia - Patient has had chronic tachycardia and no recent EKG. Does not report any other new symptoms. Has chronic feeling that she "holds her air in" when she breathes that is worsening over past months.  Review of Systems - Per HPI. Additionally, nurse check 10 AM showed BP 174/94. Takes HCTZ-captopril and norvasc.   PMH: Meds:  - Lamictal 200mg  BID (recent decrease due to side effects at 300mg  BID).  Smoking status: current daily smoker    Objective:  Physical Exam BP 142/75  Pulse 116  Temp(Src) 98.2 F (36.8 C) (Oral) GEN: NAD, Well-dressed and well-groomed CV: Tachycardia to 110s, Regular rhythm, no m/r/g PULM: normal effort NEURO:  Walks with bilateral  canes/arm braces PSYCH: Anxious-appearing PHQ-9 score 17.  HEENT: Right EAM mild erythema near TM, TM with brownish dried blood/wax 2mm area, no tenderness  12-Lead EKG - Sinus tachycardia with RBBB, RBBB seen on previous EKG 07/2011  Assessment:     Madison [name] is a 38 y.o. female with complex PMH here for f/u of multiple issues.    Plan:     Chronic pain - Discussed goal of controlling rather than completely relieving chronic pain. Also discussed that staying active helps, which pt states she does. No red flag symptoms reported. - Continue to walk, especially with nice weather. Take 1 day rest per week. - Continue ice therapy and do learned PT at home. - Continue vicodin for now with eventual goal to decrease. - F/u with pain clinic. - D/c NSAID with chronic htn. - UDS at f/u. Controlled substances contract on file (10-21-13 and 10-10-10). - Pain likely correlates with anxiety/depr. PHQ-9 17 today. F/u regularly/monthly to discuss. At next visit, f/u on psych referal placed previously.  Presumed UTI - Back pain, urgency, and frequency, though afebrile. Tachycardia is chronic. - UA collected late so patient left without result. Will call with result. - Return precautions reviewed.  Otitis externa - Resolving on exam, though EAM at border with TM mildly erythematous. Dried blood/wax on TM. No tenderness.  - Monitor - Continue neti pot for sinus relief. - If congestion does not respond, can call for flonase. - Return precautions reviewed.  Tachycardia - Chronic. No recent eval, with previous EKG in  07/2011 (sinus rhythm with incomplete RBBB). Other vitals stable. TSH normal 03/2012, Hgb normal 01/2013, BMET normal 02/21/14.  - EKG today>>Sinus tachycardia with persistent RBBB. - Asked pt to make appointment with Dr Valentina Lucks for evaluation of persistent tachycardia and HTN in pt on multiple medications and h/o chemotherapeutic agent contributing to HTN. - Return to clinic for next  available appointment to evaluate worsening vague symptom of "holding air in" when breathes. - Return precautions reviewed.  Additionally - - Needs med review: Can we consolidate duloxetine rx? Review anxiolitic and antiepilieptic with pt. - Needs f/u HTN.  # Health Maintenance: Discussed benefit in [place] of monthly regular visits.  Follow-up: Follow up in 2 weeks for f/u of pain and tachycardia.    [name], MD [place]

## 2014-03-10 ENCOUNTER — Encounter: Payer: Self-pay | Admitting: Family Medicine

## 2014-03-10 DIAGNOSIS — R399 Unspecified symptoms and signs involving the genitourinary system: Secondary | ICD-10-CM | POA: Insufficient documentation

## 2014-03-10 MED ORDER — OMEPRAZOLE 20 MG PO CPDR: 20.0000 mg | DELAYED_RELEASE_CAPSULE | Freq: Every day | ORAL | Status: DC

## 2014-03-10 MED ORDER — NITROFURANTOIN MONOHYD MACRO 100 MG PO CAPS: 100.0000 mg | ORAL_CAPSULE | Freq: Two times a day (BID) | ORAL | Status: DC

## 2014-03-10 NOTE — Assessment & Plan Note (Addendum)
Discussed goal of controlling rather than completely relieving chronic pain. Also discussed that staying active helps, which pt states she does. No red flag symptoms reported. - Continue to walk, especially with nice weather. Take 1 day rest per week. - Continue ice therapy and do learned PT at home. - Continue vicodin for now with eventual goal to decrease. - F/u with pain clinic. - D/c NSAID with chronic htn. - UDS at f/u. Controlled substances contract on file (10-21-13 and 10-10-10). - Pain likely correlates with anxiety/depr. PHQ-9 17 today. F/u regularly/monthly to discuss. At next visit, f/u on psych referal placed previously.

## 2014-03-10 NOTE — Assessment & Plan Note (Addendum)
Back pain, urgency, and frequency, though afebrile. Tachycardia is chronic. - UA. Collected late so patient left prior to result. Will call with result. - Return precautions reviewed.

## 2014-03-10 NOTE — Assessment & Plan Note (Signed)
Resolving on exam, though EAM at border with TM mildly erythematous. Dried blood/wax on TM. No tenderness.  - Monitor - Continue neti pot for sinus relief. - If congestion does not respond, can call for flonase. - Return precautions reviewed.

## 2014-03-10 NOTE — Progress Notes (Signed)
Patient ID: Madison [name], female   DOB: 05/01/1976, 38 y.o.   MRN: [id number]  Received Missed Visit Note from [facility] stating "No answer to locked door/phone" for Skilled Nurse visit. [name], MD

## 2014-03-10 NOTE — Assessment & Plan Note (Signed)
Chronic. No recent eval, with previous EKG in 07/2011 (sinus rhythm with incomplete RBBB). Other vitals stable. TSH normal 03/2012, Hgb normal 01/2013, BMET normal 02/21/14.  - EKG today>>Sinus tachycardia with persistent RBBB. - Asked pt to make appointment with Dr Valentina Lucks for evaluation of persistent tachycardia and HTN in pt on multiple medications and h/o chemotherapeutic agent contributing to HTN. - Return to clinic for next available appointment to evaluate worsening vague symptom of "holding air in" when breathes. - Return precautions reviewed.

## 2014-03-14 ENCOUNTER — Ambulatory Visit (HOSPITAL_BASED_OUTPATIENT_CLINIC_OR_DEPARTMENT_OTHER): Payer: Medicare Other | Admitting: Physical Medicine & Rehabilitation

## 2014-03-14 ENCOUNTER — Encounter: Payer: Self-pay | Admitting: Physical Medicine & Rehabilitation

## 2014-03-14 ENCOUNTER — Telehealth: Payer: Self-pay

## 2014-03-14 VITALS — BP 164/84 | HR 115 | Resp 16 | Ht 65.0 in | Wt 260.0 lb

## 2014-03-14 DIAGNOSIS — Z79899 Other long term (current) drug therapy: Secondary | ICD-10-CM | POA: Insufficient documentation

## 2014-03-14 DIAGNOSIS — IMO0001 Reserved for inherently not codable concepts without codable children: Secondary | ICD-10-CM | POA: Insufficient documentation

## 2014-03-14 DIAGNOSIS — M47817 Spondylosis without myelopathy or radiculopathy, lumbosacral region: Secondary | ICD-10-CM

## 2014-03-14 DIAGNOSIS — M542 Cervicalgia: Secondary | ICD-10-CM | POA: Insufficient documentation

## 2014-03-14 DIAGNOSIS — T451X5A Adverse effect of antineoplastic and immunosuppressive drugs, initial encounter: Secondary | ICD-10-CM | POA: Insufficient documentation

## 2014-03-14 DIAGNOSIS — G620 Drug-induced polyneuropathy: Secondary | ICD-10-CM | POA: Insufficient documentation

## 2014-03-14 DIAGNOSIS — M76899 Other specified enthesopathies of unspecified lower limb, excluding foot: Secondary | ICD-10-CM | POA: Insufficient documentation

## 2014-03-14 DIAGNOSIS — M25569 Pain in unspecified knee: Secondary | ICD-10-CM | POA: Insufficient documentation

## 2014-03-14 DIAGNOSIS — R209 Unspecified disturbances of skin sensation: Secondary | ICD-10-CM | POA: Insufficient documentation

## 2014-03-14 DIAGNOSIS — M549 Dorsalgia, unspecified: Secondary | ICD-10-CM | POA: Insufficient documentation

## 2014-03-14 DIAGNOSIS — M79609 Pain in unspecified limb: Secondary | ICD-10-CM | POA: Insufficient documentation

## 2014-03-14 DIAGNOSIS — M25559 Pain in unspecified hip: Secondary | ICD-10-CM | POA: Insufficient documentation

## 2014-03-14 DIAGNOSIS — G894 Chronic pain syndrome: Secondary | ICD-10-CM | POA: Insufficient documentation

## 2014-03-14 MED ORDER — BETAMETHASONE SOD PHOS & ACET 6 (3-3) MG/ML IJ SUSP
6.0000 mg | Freq: Once | INTRAMUSCULAR | Status: AC
  Administered 2014-03-14: 6 mg via INTRAMUSCULAR

## 2014-03-14 NOTE — Progress Notes (Signed)
6mg  Celestone (1 ml) given IM  in left upper outer quadrant of gluteus maximus.

## 2014-03-14 NOTE — Telephone Encounter (Signed)
Patient called complaining of increase left leg pain since radiofrequency.  Appointment made so she can discuss issues.

## 2014-03-14 NOTE — Progress Notes (Signed)
Subjective:    Patient ID: Madison [name], female    DOB: 15-Jun-1976, 38 y.o.   MRN: [id number]  HPI Patient complains of increased pain on the left side low back going into the hip area. This occurred since receiving the right L3-L4 medial branch and L5 dorsal ramus radiofrequency procedure on 02/21/2014, patient has not noticed much left-sided pain in the past. Previous medial branch blocks focused on the right side of the lumbar area. Some numbness into the left foot but also has a history of peripheral neuropathy secondary to chemotherapy  Had one fall off a shower chair but did not injure her left leg Also had a fall coming into the office today but states she didn't have any injury Pain is improved with ice Pain response to position changes  Pain Inventory Average Pain 6 Pain Right Now 9 My pain is constant, burning and aching  In the last 24 hours, has pain interfered with the following? General activity 10 Relation with others 10 Enjoyment of life 10 What TIME of day is your pain at its worst? constant Sleep (in general) Fair  Pain is worse with: walking, sitting, unsure and some activites Pain improves with: rest Relief from Meds: na  Mobility walk with assistance use a cane ability to climb steps?  no do you drive?  no transfers alone  Function disabled: date disabled na I need assistance with the following:  meal prep, household duties and shopping  Neuro/Psych weakness numbness trouble walking anxiety  Prior Studies Any changes since last visit?  no  Physicians involved in your care Any changes since last visit?  no   Family History  Problem Relation Age of Onset  . Diabetes Sister   . Hypertension Maternal Grandfather    History   Social History  . Marital Status: Legally Separated    Spouse Name: N/A    Number of Children: N/A  . Years of Education: N/A   Social History Main Topics  . Smoking status: Current Every Day Smoker --  1.00 packs/day    Types: Cigarettes  . Smokeless tobacco: Never Used     Comment: 1 ppd for 20 years   . Alcohol Use: No  . Drug Use: No  . Sexual Activity: Not Currently    Birth Control/ Protection: Surgical   Other Topics Concern  . None   Social History Narrative   Living w parents in [place] due to cancer and inability to take car for herself or others. Has 3 children. 84 yo has bipolar and MR, currently in foster home. Other 2 live with her. Divorced.    Past Surgical History  Procedure Laterality Date  . Appendectomy    . Tonsillectomy    . Tubal ligation    . Mastectomy      bilateral  . Cholecystectomy  March 2011  . Abdominal hysterectomy  March 2012  . Breast surgery      bilateral mastectomy  . Port-a-cath insertion     Past Medical History  Diagnosis Date  . Tobacco user   . Grand mal seizure   . Fibromyalgia   . Seizures   . Breast CA   . GERD (gastroesophageal reflux disease)   . Depression   . Arthritis   . Allergy   . Hypertension    BP 164/84  Pulse 115  Resp 16  Ht 5\' 5"  (1.651 m)  Wt 260 lb (117.935 kg)  BMI 43.27 kg/m2  SpO2 96%  Opioid  Risk Score:   Fall Risk Score: High Fall Risk (>13 points) (pt educated and given brochure on fall risk previously)    Review of Systems  Musculoskeletal: Positive for gait problem.  Neurological: Positive for weakness and numbness.  Psychiatric/Behavioral: The patient is nervous/anxious.   All other systems reviewed and are negative.      Objective:   Physical Exam  General: Comfortable sitting but pain with standing Tenderness to palpation left lumbar paraspinal muscles. Negative straight leg raise test 4/5 strength bilateral hip flexors knee extensors ankle dorsiflexors 2+ deep tendon reflexes bilateral knees and ankles Mildly reduced left S1 dermatomes sensation to light touch, also similar findings left L3     Assessment & Plan:  1. Left-sided low back pain with some radiation into the hip  and thigh area. Suspect this is related to lumbar facet problems and is now more apparent now that the right radiofrequency has taken effect. Will schedule for lumbar medial branch blocks on the left side. The patient does have some symptoms which may refer to S1 dermatomes but difficult to say given her history of peripheral neuropathy. We discussed possible change from Lyrica to gabapentin but will hold off until after the medial branch blocks  Will injected Celestone 6 mg IM today for pain exacerbation

## 2014-03-17 ENCOUNTER — Telehealth: Payer: Self-pay | Admitting: Family Medicine

## 2014-03-17 ENCOUNTER — Other Ambulatory Visit: Payer: Self-pay | Admitting: *Deleted

## 2014-03-17 NOTE — Telephone Encounter (Signed)
Pt called because she is having issues with the pharmacy refilling her medications. She said she takes Lyrica 4 times a day and that medicaid and the pharmacy will only fill it for 3 times a day. Cymbalta she has two prescriptions 20 mg that she takes 1 in the AM and 60 mg 60 mg at night. They also have a issue with that one. Please call and let her know what she should do. jw

## 2014-03-20 MED ORDER — AMLODIPINE BESYLATE 10 MG PO TABS: ORAL_TABLET | ORAL | Status: DC

## 2014-03-30 ENCOUNTER — Ambulatory Visit (HOSPITAL_BASED_OUTPATIENT_CLINIC_OR_DEPARTMENT_OTHER): Payer: Medicare Other | Admitting: Physical Medicine & Rehabilitation

## 2014-03-30 ENCOUNTER — Encounter: Payer: Medicare Other | Attending: Physical Medicine & Rehabilitation

## 2014-03-30 ENCOUNTER — Encounter: Payer: Self-pay | Admitting: Physical Medicine & Rehabilitation

## 2014-03-30 VITALS — BP 134/84 | HR 97 | Resp 14 | Ht 65.0 in | Wt 260.0 lb

## 2014-03-30 DIAGNOSIS — M47817 Spondylosis without myelopathy or radiculopathy, lumbosacral region: Secondary | ICD-10-CM

## 2014-03-30 NOTE — Progress Notes (Signed)
Left Lumbar L3, L4  medial branch blocks and L 5 dorsal ramus injection under fluoroscopic guidance   Indication: Left Lumbar pain which is not relieved by medication management or other conservative care and interfering with self-care and mobility.  Informed consent was obtained after describing risks and benefits of the procedure with the patient, this includes bleeding, bruising, infection, paralysis and medication side effects.  The patient wishes to proceed and has given written consent.  The patient was placed in a prone position.  The lumbar area was marked and prepped with Betadine.  One mL of 1% lidocaine was injected into each of 3 areas into the skin and subcutaneous tissue.  Then a 22-gauge 5in spinal needle was inserted targeting the junction of the left S1 superior articular process and sacral ala junction.  Needle was advanced under fluoroscopic guidance.  Bone contact was made.  Omnipaque 180 was injected x 0.5 mL demonstrating no intravascular uptake.  Then a solution containing one mL of 4 mg per mL dexamethasone and 3 mL of 2% MPF lidocaine was injected x 0.5 mL.  Then the left L5 superior articular process in transverse process junction was targeted.  Bone contact was made.  Omnipaque 180 was injected x 0.5 mL demonstrating no intravascular uptake.  Then a solution containing one mL of 4 mg per mL dexamethasone and 3 mL of 2% MPF lidocaine was injected x 0.5 mL.  Then the left L4 superior articular process in transverse process junction was targeted.  Bone contact was made.  Omnipaque 180 was injected x 0.5 mL demonstrating no intravascular uptake.  Then a solution containing one mL of 4 mg per mL dexamethasone and 3 mL of 2% MPF lidocaine was injected x 0.5 mL.  Patient tolerated procedure well.  Post procedure instructions were given.

## 2014-03-30 NOTE — Progress Notes (Signed)
  PROCEDURE RECORD [facility]   Name: Madison [name] DOB:1976/07/26 MRN: [id number]  Date:03/30/2014  Physician: Alysia Penna, MD    Nurse/CMA: Shumaker RN  Allergies:  Allergies  Allergen Reactions  . Taxol [Paclitaxel]   . Tramadol   . Ultram [Tramadol Hcl]     Consent Signed: yes  Is patient diabetic? no  CBG today?   Pregnant: no LMP: No LMP recorded. Patient has had a hysterectomy. (age 40-55)  Anticoagulants: no Anti-inflammatory: no Antibiotics: no  Procedure: Left L3-4-5 Medical Branch Block Position: Prone Start Time: 11:50 End Time: 11:56 Fluoro Time: 18 seconds  RN/CMA Flemon Kelty CMA Shumaker RN    Time 11:29 12:00    BP 134/84 135/81    Pulse 97 108    Respirations 14 14    O2 Sat 94 94    S/S 6 6    Pain Level 9/10 6/10     D/C home with dad, patient A & O X 3, D/C instructions reviewed, and sits independently.

## 2014-03-30 NOTE — Patient Instructions (Signed)

## 2014-04-06 NOTE — Telephone Encounter (Signed)
LM for patient to call back.  She has an appt tomorrow so we can discuss medication then.  Madison [name],CMA

## 2014-04-06 NOTE — Telephone Encounter (Signed)
Please let her know I do not know how to get around Medicaid's rules unfortunately. She can call them and ask what changes need to be made. She should at least continue lyrica TID and we will need to discuss cymbalta again when she comes in. We had added the additional 20mg  dose in hopes that it would help and with the plan to follow it up. If it is helping, we can try to write it as 40mg  BID (to still equal the 80mg  dose).  Thanks.  [name], MD

## 2014-04-07 ENCOUNTER — Encounter: Payer: Self-pay | Admitting: Family Medicine

## 2014-04-07 ENCOUNTER — Ambulatory Visit (INDEPENDENT_AMBULATORY_CARE_PROVIDER_SITE_OTHER): Payer: Medicare Other | Admitting: Family Medicine

## 2014-04-07 ENCOUNTER — Telehealth: Payer: Self-pay | Admitting: *Deleted

## 2014-04-07 VITALS — BP 160/99 | HR 110 | Temp 98.4°F | Wt 260.0 lb

## 2014-04-07 DIAGNOSIS — G8929 Other chronic pain: Secondary | ICD-10-CM

## 2014-04-07 DIAGNOSIS — F411 Generalized anxiety disorder: Secondary | ICD-10-CM

## 2014-04-07 DIAGNOSIS — I158 Other secondary hypertension: Secondary | ICD-10-CM

## 2014-04-07 DIAGNOSIS — I159 Secondary hypertension, unspecified: Secondary | ICD-10-CM

## 2014-04-07 MED ORDER — CAPTOPRIL-HYDROCHLOROTHIAZIDE 25-25 MG PO TABS: 1.0000 | ORAL_TABLET | Freq: Two times a day (BID) | ORAL | Status: DC

## 2014-04-07 MED ORDER — HYDROCODONE-ACETAMINOPHEN 10-325 MG PO TABS: 1.0000 | ORAL_TABLET | Freq: Four times a day (QID) | ORAL | Status: DC | PRN

## 2014-04-07 MED ORDER — DULOXETINE HCL 20 MG PO CPEP: 40.0000 mg | ORAL_CAPSULE | Freq: Two times a day (BID) | ORAL | Status: DC

## 2014-04-07 NOTE — Assessment & Plan Note (Signed)
Worsened likely due to pain, though the reverse is probably also true. Pt is consistently poorly controlled. She takes cymbalta 20mg  AM and 60mg  PM which works well but insurance has told her they will not cover. - Represcribe cymbalta 40mg  BID. - Encouraged pt to f/u with Dr Gwenlyn Saran as anxiety is unlikely to improve with meds alone. She is amenable. - Pt would like me to discuss if she has other options for pain or is relegated to steroids, as these make her hungry and gain weight and she feels stuck. - Will refill her vicodin for pain. Will not plan to increase to percocet and in fact re-emphasized that chronic narcotics are not a good solution as they can actually eventually worsen pain, create dependence, and worsen depression.

## 2014-04-07 NOTE — Assessment & Plan Note (Signed)
Poorly controlled.  - Increase captopril-HCTZ 25-25mg  from daily to BID. - Continue norvasc 10mg  daily. - F/u BMET next visit. - Continue checking BP at home. - F/u 1 month.

## 2014-04-07 NOTE — Assessment & Plan Note (Signed)
Likely worsened with anxiety.  - See above for anxiety. - Refilled 1 month of vicodin. - Will not plan to increase to percocet and in fact re-emphasized that chronic narcotics are not a good solution as they can actually eventually worsen pain, create dependence, and worsen depression. - UDS today. Pt has pain contract on file. - Per pt, Dr Wong(Neuro) is weaning up gabapentin with plan to taper off lyrica.

## 2014-04-07 NOTE — Telephone Encounter (Signed)
Received a prior authorization for Duloxetine HCL DR 20 mg cap take 2 capsules by mouth twice a day.  The max daily dose is 2 caps.  Per Merrilee Seashore, Pharmacist pt can take one 60 mg Cap and one 20 mg cap to get the total 80 mg daily.  Duloxetine only comes in 20, 30 and 60 mg capsules.  Per Dr. Dianah Field verbal order ok to dispense one 60 mg cap and one 20 mg cap daily for a total of 80 mg max dose a day.  Derl Barrow, RN

## 2014-04-07 NOTE — Progress Notes (Signed)
Patient ID: Madison [name], female   DOB: 06-Nov-1976, 38 y.o.   MRN: [id number] Subjective:   CC: F/u HTN and anxiety  HPI:   HTN BP checked at home has been 180s/110 on average with a few better ones but many worse. She has had worse headache than baseline but denies chest pain, dizziness worse than baseline or sudden neurologic changes. She takes medications regularly.  Anxiety and pain Pain is playing a large role in anxiety which is "going through the roof." Radioneurotomy by pain clinic worked better than medial branch block. Tylenol is not helping patient. She ices her back "all the time." She takes norco and wonders if percocet would be better. She feels out of options since she was told to stop NSAIDs due to uncontrolled HTN. She has not seen Dr Gwenlyn Saran in some time due to scheduling conflicts.  Review of Systems - Per HPI. Additionally, abdominal "burning" sensation below left mastectomy scar and down her side x 2 weeks.  PMH: Medication change by neurologist: Increasing gabapentin and tapering off lyrica Recent switch from arm-brace crutches to rolling walker which pt likes better.    Objective:  Physical Exam BP 160/99  Pulse 110  Temp(Src) 98.4 F (36.9 C) (Oral)  Wt 260 lb (117.935 kg) GEN: NAD HEENT: Atraumatic, normocephalic, neck supple, EOMI, sclera clear  CV: RRR, no murmurs, rubs, or gallops though distant PULM: CTAB, normal effort ABD: soft, nontender, obese SKIN: No rash or cyanosis; warm and well-perfused PSYCH: Mood and affect mildly down, normal rate and volume of speech NEURO: Awake, alert, no focal deficits grossly, normal speech, using walker    Assessment:     Madison [name] is a 38 y.o. female here for follow up of HTN, anxiety, and pain.    Plan:     HTN Poorly controlled.  - Increase captopril-HCTZ 25-25mg  from daily to BID. - Continue norvasc 10mg  daily. - F/u BMET next visit. - Continue checking BP at home. - F/u 1  month.  Anxiety Worsened likely due to pain, though the reverse is probably also true. Pt is consistently poorly controlled. She takes cymbalta 20mg  AM and 60mg  PM which works well but insurance has told her they will not cover. - Represcribe cymbalta 40mg  BID. - Encouraged pt to f/u with Dr Gwenlyn Saran as anxiety is unlikely to improve with meds alone. She is amenable. - Pt would like me to discuss if she has other options for pain or is relegated to steroids, as these make her hungry and gain weight and she feels stuck. - Will refill her vicodin for pain. Will not plan to increase to percocet and in fact re-emphasized that chronic narcotics are not a good solution as they can actually eventually worsen pain, create dependence, and worsen depression.  Pain Likely worsened with anxiety.  - See above for anxiety. - Refilled 1 month of vicodin. - Will not plan to increase to percocet and in fact re-emphasized that chronic narcotics are not a good solution as they can actually eventually worsen pain, create dependence, and worsen depression. - UDS today. Pt has pain contract on file. - Per pt, Dr Wong(Neuro) is weaning up gabapentin with plan to taper off lyrica.    Follow-up: Follow up in 1 mo for BP, pain and anxiety.    [name], MD [place]

## 2014-04-07 NOTE — Patient Instructions (Signed)
Good to see you.  We are increasing cymbalta to 40mg  twice daily (same total dose). Follow up with Dr Gwenlyn Saran.  For pain, we are getting a UDS today as part of clinic polic.  For your BP, increase captopril-HCTZ to twice daily. On follow up we will check bloodwork. Continue checking BP at home.  See me in 1 month.  Best,  [name], MD

## 2014-04-07 NOTE — Telephone Encounter (Signed)
Yes, this is fine. Please let Ms Tetro know that despite our plan discussed at her appointment today for duloxetine 40mg  BID (which she had requested because insurance was having issues filling her 60mg  and 20mg  tablets), it seems insurance and/or pharmacy is more interested in filling the 20mg  and 60mg  tablets which both pt and I were okay with from the start.  [name], MD

## 2014-04-08 LAB — DRUG SCR UR, PAIN MGMT, REFLEX CONF
Amphetamine Screen, Ur: NEGATIVE
BARBITURATE QUANT UR: NEGATIVE
Benzodiazepines.: NEGATIVE
Cocaine Metabolites: NEGATIVE
Creatinine,U: 53.68 mg/dL
Marijuana Metabolite: NEGATIVE
Methadone: NEGATIVE
OPIATES: NEGATIVE
PROPOXYPHENE: NEGATIVE
Phencyclidine (PCP): NEGATIVE

## 2014-04-10 NOTE — Telephone Encounter (Signed)
Pt notified about the continuation of therapy with Duloxetine HCL, no change with 20 mg in AM and 60 mg PM.  Derl Barrow, RN

## 2014-04-10 NOTE — Telephone Encounter (Signed)
Will forward to RN. Mukhtar Shams,CMA  

## 2014-04-11 ENCOUNTER — Telehealth: Payer: Self-pay | Admitting: Family Medicine

## 2014-04-11 NOTE — Telephone Encounter (Signed)
Has questions about her cymbalta prescription Please advise

## 2014-04-13 NOTE — Telephone Encounter (Signed)
Left message on voicemail for patient to call back to discuss rx.

## 2014-04-17 ENCOUNTER — Other Ambulatory Visit: Payer: Self-pay | Admitting: Family Medicine

## 2014-04-17 NOTE — Telephone Encounter (Signed)
Patient needs both Elavil and Norvasc refilled. Patient states these were supposed to be refilled while she was in to see Dr. Darene Lamer on 04/07/14. Please advise.

## 2014-04-18 ENCOUNTER — Other Ambulatory Visit: Payer: Self-pay | Admitting: *Deleted

## 2014-04-18 ENCOUNTER — Telehealth: Payer: Self-pay | Admitting: Oncology

## 2014-04-18 ENCOUNTER — Other Ambulatory Visit: Payer: Self-pay | Admitting: Oncology

## 2014-04-18 ENCOUNTER — Ambulatory Visit (HOSPITAL_BASED_OUTPATIENT_CLINIC_OR_DEPARTMENT_OTHER): Payer: Medicare Other | Admitting: Oncology

## 2014-04-18 ENCOUNTER — Other Ambulatory Visit (HOSPITAL_BASED_OUTPATIENT_CLINIC_OR_DEPARTMENT_OTHER): Payer: Medicare Other

## 2014-04-18 VITALS — BP 152/87 | HR 123 | Temp 99.2°F | Resp 18 | Ht 65.0 in | Wt 258.6 lb

## 2014-04-18 DIAGNOSIS — R5381 Other malaise: Secondary | ICD-10-CM

## 2014-04-18 DIAGNOSIS — C50919 Malignant neoplasm of unspecified site of unspecified female breast: Secondary | ICD-10-CM

## 2014-04-18 DIAGNOSIS — C50019 Malignant neoplasm of nipple and areola, unspecified female breast: Secondary | ICD-10-CM

## 2014-04-18 DIAGNOSIS — R5383 Other fatigue: Secondary | ICD-10-CM

## 2014-04-18 DIAGNOSIS — Z170 Estrogen receptor positive status [ER+]: Secondary | ICD-10-CM

## 2014-04-18 LAB — CBC WITH DIFFERENTIAL/PLATELET
BASO%: 0.9 % (ref 0.0–2.0)
Basophils Absolute: 0.1 10*3/uL (ref 0.0–0.1)
EOS%: 3.8 % (ref 0.0–7.0)
Eosinophils Absolute: 0.5 10*3/uL (ref 0.0–0.5)
HEMATOCRIT: 43.9 % (ref 34.8–46.6)
HGB: 14.1 g/dL (ref 11.6–15.9)
LYMPH%: 21 % (ref 14.0–49.7)
MCH: 26.9 pg (ref 25.1–34.0)
MCHC: 32.2 g/dL (ref 31.5–36.0)
MCV: 83.6 fL (ref 79.5–101.0)
MONO#: 0.9 10*3/uL (ref 0.1–0.9)
MONO%: 6.6 % (ref 0.0–14.0)
NEUT#: 8.7 10*3/uL — ABNORMAL HIGH (ref 1.5–6.5)
NEUT%: 67.7 % (ref 38.4–76.8)
Platelets: 350 10*3/uL (ref 145–400)
RBC: 5.25 10*6/uL (ref 3.70–5.45)
RDW: 14.9 % — ABNORMAL HIGH (ref 11.2–14.5)
WBC: 12.9 10*3/uL — ABNORMAL HIGH (ref 3.9–10.3)
lymph#: 2.7 10*3/uL (ref 0.9–3.3)

## 2014-04-18 LAB — COMPREHENSIVE METABOLIC PANEL (CC13)
ALT: 34 U/L (ref 0–55)
ANION GAP: 15 meq/L — AB (ref 3–11)
AST: 23 U/L (ref 5–34)
Albumin: 3.6 g/dL (ref 3.5–5.0)
Alkaline Phosphatase: 106 U/L (ref 40–150)
BUN: 7.7 mg/dL (ref 7.0–26.0)
CO2: 29 meq/L (ref 22–29)
CREATININE: 0.7 mg/dL (ref 0.6–1.1)
Calcium: 10.2 mg/dL (ref 8.4–10.4)
Chloride: 99 mEq/L (ref 98–109)
Glucose: 90 mg/dl (ref 70–140)
Potassium: 3.4 mEq/L — ABNORMAL LOW (ref 3.5–5.1)
Sodium: 142 mEq/L (ref 136–145)
Total Bilirubin: 0.23 mg/dL (ref 0.20–1.20)
Total Protein: 7.5 g/dL (ref 6.4–8.3)

## 2014-04-18 MED ORDER — ANASTROZOLE 1 MG PO TABS: 1.0000 mg | ORAL_TABLET | Freq: Every day | ORAL | Status: DC

## 2014-04-18 MED ORDER — AMLODIPINE BESYLATE 10 MG PO TABS: ORAL_TABLET | ORAL | Status: DC

## 2014-04-18 NOTE — Progress Notes (Signed)
[place]  Telephone:([phone] Fax:([phone]) [phone]  OFFICE PROGRESS NOTE     ID: Madison [name]   DOB: [birth year]  MR#: [id number]  DTH#:[id number]   PCP: Madison Slipper, MD GYN: Madison [name] SU: Madison [name] OTHER MD: Madison [name], Madison [name], Madison [name]   HISTORY OF PRESENT ILLNESS: From Madison [name]'s new patient evaluation note dated 04/05/2009:  "This woman is here today with her mother and mother's friend.  She apparently was noted to have a palpable mass in the left breast at about the 3 o'clock position back in October.  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  She had a 12-monthfollowup mammogram.  In March of this year, she began experiencing pain in her breast.  [facility].  She had a mammogram on 02/28/2009.  Physical exam at that time showed a palpable superficial subareolar mass extending from the 12 to 3 o'clock position measuring 2 cm.  On physical exam, ultrasound showed this to be about a 2.4 x 2 x 1.4 cm mass with mild posterior acoustic enhancement.  This was a lobular and hypoechoic mass felt to be consistent with a fibroadenoma but malignancy cannot be ruled out.  Of note is that the mass previously measured 1.7 x 1.4 x 1.1 cm back in October.  No abscess was seen.  No axillary adenopathy was seen.  The patient was referred to Dr. SMargot [name]  Because of the feeling this represented a fibroadenoma, an excision was planned and performed on 04/02/2009.  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  Margins were positive for tumor.  Lymphovascular invasion was not identified.  Prior to surgery and there was a concern about an additional lesion seen.  Additional breast tissue removed from the primary surgery showed the  fibrocystic changes and intraductal papilloma.  No other evidence of malignancy.  This tumor was noted to be ER and PR positive.  Special stains were also performed and apparently this was negative for chromogranin and minimal staining for synaptophysin.  Total dimensions of this mass could not be accurately assessed.  It was felt to be about 1.5 cm.  Because of some trabecular and organoid appearance of this lesion in some of the sections, special stains were performed and the tumor was felt to be high grade.  The patient has had an unremarkable postoperative course."    Her subsequent history is as detailed below.   INTERVAL HISTORY: NKinleyreturns today for followup of her left-sided invasive ductal carcinoma  accompanied by her mother. She continues on anastrozole daily and appears to be tolerating that well. She does have hot flashes and night sweats as the main side effects from that treatment. Vaginal dryness is also a concern.  REVIEW OF SYSTEMS: Madison [name] multiple chronic issues which make for a multiply positive review of systems. Aside from the night sweats and hot flashes she has fibromyalgia, with pain in her lower back, multiple joints, and difficulty walking. She is fatigued. She has problems with her vision. She has significant sinus issues but denies hoarseness or cough. She has an irregular heartbeat. Sometimes her feet swell. She sleeps on 3 pillows. She has heartburn. She has stress urinary incontinence. She has a history of bladder infections. She has a history of seizures, headache, weakness,  anxiety, and depression. She has a history of hallucinations. A detailed review of systems was otherwise stable  PAST MEDICAL HISTORY: Past Medical History  Diagnosis Date  . Tobacco user   . Grand mal seizure   . Fibromyalgia   . Seizures   . Breast CA   . GERD (gastroesophageal reflux disease)   . Depression   . Arthritis   . Allergy   . Hypertension     PAST SURGICAL  HISTORY: Past Surgical History  Procedure Laterality Date  . Appendectomy    . Tonsillectomy    . Tubal ligation    . Mastectomy      bilateral  . Cholecystectomy  March 2011  . Abdominal hysterectomy  March 2012  . Breast surgery      bilateral mastectomy  . Port-a-cath insertion      FAMILY HISTORY Family History  Problem Relation Age of Onset  . Diabetes Sister   . Hypertension Maternal Grandfather   Both parents alive, but divorced  Her mother is the Chiropodist at [employer].  She has a sister and brother both of whom are alive and well in good health.  GYNECOLOGIC HISTORY: She is GX, P2, menarche age 38.  She underwent total abdominal hysterectomy with bilateral salpingo-oophorectomy in 01/2011.  SOCIAL HISTORY: She is separated.  Originally from [place], went to [employer] and subsequently went to [employer]. She is disabled secondary to her fibromyalgia and seizure disorder..  Her daughter Madison [name] (pronounced "Madison [name]") is currently 47, and lives with the patient. Son Madison [name], 21, is a special needs child and lives in a group home.   ADVANCED DIRECTIVES: In place. The patient has a living will but requests no extraordinary support in case of a terminal event. The patient's healthcare power of attorney is her mother, Madison [name], cell number [phone]   HEALTH MAINTENANCE: History  Substance Use Topics  . Smoking status: Current Every Day Smoker -- 1.00 packs/day    Types: Cigarettes  . Smokeless tobacco: Never Used     Comment: 1 ppd for 20 years   . Alcohol Use: No     Colonoscopy: Never  PAP: s/p hysterectomy  Bone density: pending  Lipid panel: Not on file  Allergies  Allergen Reactions  . Taxol [Paclitaxel]   . Tramadol   . Ultram [Tramadol Hcl]     Current Outpatient Prescriptions  Medication Sig Dispense Refill  . AMBULATORY NON FORMULARY MEDICATION Knee-high graduated compression stockings. Apply to lower extremities.  1  each  0  . amitriptyline (ELAVIL) 100 MG tablet Take 1 tablet (100 mg total) by mouth at bedtime.  60 tablet  5  . amLODipine (NORVASC) 10 MG tablet TAKE 1 TABLET BY MOUTH DAILY  60 tablet  5  . anastrozole (ARIMIDEX) 1 MG tablet Take 1 tablet (1 mg total) by mouth daily.  30 tablet  12  . antiseptic oral rinse (BIOTENE) LIQD 15 mLs by Mouth Rinse route as needed for dry mouth.      . captopril-hydrochlorothiazide (CAPOZIDE) 25-25 MG per tablet Take 1 tablet by mouth 2 (two) times daily.  60 tablet  5  . clonazePAM (KLONOPIN) 0.5 MG tablet Take 0.5-1 mg by mouth 2 (two) times daily.      . clonazePAM (KLONOPIN) 1 MG disintegrating tablet Take 2 tablets (2 mg total) by mouth 2 (two) times daily as needed.  60 tablet  3  . cyclobenzaprine (FLEXERIL) 10 MG tablet TAKE 1 TABLET BY MOUTH TWICE A  DAY AS NEEDED FOR MUSCLE SPASM  60 tablet  2  . Diazepam (DIASTAT ACUDIAL) 20 MG GEL Place 20 mg rectally as needed (for seizures longer than 5 minutes or seizures clusters.).  2 each  3  . DULoxetine (CYMBALTA) 20 MG capsule Take 2 capsules (40 mg total) by mouth 2 (two) times daily.  120 capsule  2  . esomeprazole (NEXIUM) 40 MG capsule Take 1 capsule (40 mg total) by mouth daily.  30 capsule  3  . gabapentin (NEURONTIN) 600 MG tablet Take 1 tablet (600 mg total) by mouth 3 (three) times daily.  3 tablet  1  . HYDROcodone-acetaminophen (NORCO) 10-325 MG per tablet Take 1 tablet by mouth every 6 (six) hours as needed.  30 tablet  0  . ibuprofen (ADVIL,MOTRIN) 800 MG tablet TAKE 1 TABLET BY MOUTH EVERY 8 HOURS IF NEEDED FOR PAIN. TAKE WITH FOOD.  180 tablet  1  . lamoTRIgine (LAMICTAL) 200 MG tablet Take 200 mg by mouth 2 (two) times daily.       Marland Kitchen levETIRAcetam (KEPPRA) 1000 MG tablet Take 2,000 mg by mouth 2 (two) times daily. Rx by Dr. Ginny Forth, [facility]      . levofloxacin (LEVAQUIN) 500 MG tablet Take 1 tablet (500 mg total) by mouth daily.  7 tablet  0  . lidocaine (XYLOCAINE) 2 % solution Take 20 mLs by  mouth as needed. Pt states she uses after a seizure.      . Melatonin 10 MG TABS Take 1 tablet by mouth at bedtime.      . mometasone (NASONEX) 50 MCG/ACT nasal spray Place 2 sprays into the nose daily.  17 g  11  . mometasone (NASONEX) 50 MCG/ACT nasal spray Place 2 sprays into the nose.      . Multiple Vitamin (MULTIVITAMIN) tablet Take 1 tablet by mouth daily.      . naproxen sodium (ANAPROX) 220 MG tablet 220 mg.      . neomycin-polymyxin-hydrocortisone (CORTISPORIN) 3.5-10000-1 otic suspension Place 3 drops into the right ear 4 (four) times daily.  10 mL  0  . nitrofurantoin, macrocrystal-monohydrate, (MACROBID) 100 MG capsule Take 1 capsule (100 mg total) by mouth 2 (two) times daily.  14 capsule  0  . omeprazole (PRILOSEC) 20 MG capsule Take 1 capsule (20 mg total) by mouth daily. for up to 5 months, then we will re-evaluate.  60 capsule  2  . ondansetron (ZOFRAN) 4 MG tablet Take 4 mg by mouth every 8 (eight) hours as needed.      . pregabalin (LYRICA) 150 MG capsule Take 150 mg by mouth 2 (two) times daily. [facility]- one table in am, 2 in PM      . ranitidine (ZANTAC) 150 MG tablet Take 150 mg by mouth as needed.       . vitamin E (VITAMIN E) 1000 UNIT capsule Take 1,000 Units by mouth daily.       No current facility-administered medications for this visit.   Facility-Administered Medications Ordered in Other Visits  Medication Dose Route Frequency Provider Last Rate Last Dose  . heparin lock flush 100 unit/mL  500 Units Intravenous Once Eston Esters, MD      . sodium chloride 0.9 % injection 10 mL  10 mL Intravenous PRN Eston Esters, MD      . sodium chloride 0.9 % injection 10 mL  10 mL Intravenous PRN Chauncey Cruel, MD   10 mL at 10/18/13 1434  OBJECTIVE: Young white woman who appears stated age, using a "walk chair" Filed Vitals:   04/18/14 1127  BP: 152/87  Pulse: 123  Temp: 99.2 F (37.3 C)  Resp: 18     Body mass index is 43.03 kg/(m^2).      ECOG FS: 2 -  Symptomatic, <50% confined to bed  Sclerae unicteric, pupils round and equal Oropharynx clear and moist No cervical or supraclavicular adenopathy Lungs no rales or rhonchi Heart regular rate and rhythm Abd soft, obese, nontender, positive bowel sounds MSK no focal spinal tenderness Neuro: nonfocal, well oriented, pleasant affect Breasts: Status post bilateral mastectomies. There is no evidence of local recurrence. Both axillae are benign   LAB RESULTS: Lab Results  Component Value Date   WBC 10.8* 01/11/2013   NEUTROABS 7.2 01/11/2013   HGB 13.1 01/11/2013   HCT 38.9 01/11/2013   MCV 84.7 01/11/2013   PLT 319 01/11/2013      Chemistry      Component Value Date/Time   NA 139 02/13/2014 1002   NA 139 07/27/2012 1300   K 3.8 02/13/2014 1002   K 3.6 07/27/2012 1300   CL 96 02/13/2014 1002   CL 105 07/27/2012 1300   CO2 30 02/13/2014 1002   CO2 23 07/27/2012 1300   BUN 10 02/13/2014 1002   BUN 8.0 07/27/2012 1300   CREATININE 0.70 02/13/2014 1002   CREATININE 0.8 07/27/2012 1300   CREATININE 0.83 01/15/2012 1331      Component Value Date/Time   CALCIUM 10.1 02/13/2014 1002   CALCIUM 9.8 07/27/2012 1300   ALKPHOS 108 01/11/2013 1440   ALKPHOS 126 07/27/2012 1300   AST 15 01/11/2013 1440   AST 23 07/27/2012 1300   ALT 19 01/11/2013 1440   ALT 27 07/27/2012 1300   BILITOT 0.2* 01/11/2013 1440   BILITOT 0.30 07/27/2012 1300       Lab Results  Component Value Date   LABCA2 23 07/27/2012    No components found with this basename: [id number]    No results found for this basename: INR,  in the last 168 hours  Urinalysis    Component Value Date/Time   COLORURINE YELLOW 07/16/2011 1040   APPEARANCEUR CLOUDY* 07/16/2011 1040   LABSPEC 1.019 07/16/2011 1040   LABSPEC 1.015 08/01/2009 1035   PHURINE 6.0 07/16/2011 1040   GLUCOSEU NEGATIVE 07/16/2011 1040   HGBUR TRACE* 07/16/2011 1040   BILIRUBINUR NEG 03/09/2014 1725   BILIRUBINUR NEGATIVE 07/16/2011 1040   KETONESUR TRACE* 07/16/2011 1040    PROTEINUR NEG 03/09/2014 1725   PROTEINUR NEGATIVE 07/16/2011 1040   UROBILINOGEN 0.2 03/09/2014 1725   UROBILINOGEN 0.2 07/16/2011 1040   NITRITE NEG 03/09/2014 1725   NITRITE NEGATIVE 07/16/2011 1040   LEUKOCYTESUR Negative 03/09/2014 1725    STUDIES: No results found.  ASSESSMENT: Madison [name] is a  38 y.o.  [place] woman:  (1) Status post left lumpectomy 04/02/2009 for a pT1c NX invasive ductal carcinoma, grade 3, estrogen and progesterone receptor positive, HER-2 not amplified, with positive margins  (2) Status post left modified radical mastectomy 04/09/2009 showing no residual tumor in the breast, but 1/26 lymph nodes involved, for final stage of pT1c pN1, stage IIA; status post subsequent right mastectomy with benign pathology  (3) Treated adjuvantly according to the ECOG E5103 study, with 4 cycles of dose dense Adriamycin/Cytoxan, followed by 10 doses of weekly paclitaxel, all given with bevacizumab or placebo.  (4) Completed postmastectomy radiation to the left chest wall and left supraclavicular  fossa 12/07/2009  (5) Received Tamoxifen from 01/2010 until 01/2011  (6) Status post TAH-BSO 02/17/2011 with benign pathology  (7) Started anastrozole in 03/2011; bone density July 2014 was normal   (8) Genetic counseling requested 04/18/2014   PLAN:  Nalaya is doing well as far as her breast cancer is concerned. The overall plan is to continue hormone therapy for a total of 5 years, which will take [place] to February of 2016. At that time the patient will be ready to "graduate".  She will still be followed as per the [phone] protocol for an additional 5 years. That can be arranged through her primary care physician however.  Today and Audre requested and facility DO NOT RESUSCITATE form. We completed that and gave her to her to have at home. She also requested a statement saying that blood should not be drawn from either arm except in case of emergency. She has a port in place and  blood should be drawn from that. We are going to continue to flush the patient's ports on an every 6 week basis indefinitely.  The patient never did get genetic counseling, and we are placing a second request to have that arranged.  Cariah has a good understanding of the overall plan. She agrees with that. She knows the goal of treatment in her case is cure. She will call with any problems that may develop before her next visit here.   Chauncey Cruel, MD  04/18/2014   11:47 AM

## 2014-04-18 NOTE — Telephone Encounter (Signed)
They have been refilled and sent to [facility] in [place], [place]. Thanks!  [name], MD PGY-2, [place]

## 2014-04-23 ENCOUNTER — Emergency Department: Payer: Self-pay | Admitting: Emergency Medicine

## 2014-04-23 LAB — CBC WITH DIFFERENTIAL/PLATELET
Basophil #: 0.2 10*3/uL — ABNORMAL HIGH (ref 0.0–0.1)
Basophil %: 1.4 %
EOS PCT: 4.1 %
Eosinophil #: 0.5 10*3/uL (ref 0.0–0.7)
HCT: 39.8 % (ref 35.0–47.0)
HGB: 12.8 g/dL (ref 12.0–16.0)
LYMPHS ABS: 2.7 10*3/uL (ref 1.0–3.6)
Lymphocyte %: 23.5 %
MCH: 27.3 pg (ref 26.0–34.0)
MCHC: 32.1 g/dL (ref 32.0–36.0)
MCV: 85 fL (ref 80–100)
MONO ABS: 0.8 x10 3/mm (ref 0.2–0.9)
Monocyte %: 7.1 %
NEUTROS PCT: 63.9 %
Neutrophil #: 7.3 10*3/uL — ABNORMAL HIGH (ref 1.4–6.5)
PLATELETS: 287 10*3/uL (ref 150–440)
RBC: 4.68 10*6/uL (ref 3.80–5.20)
RDW: 15.1 % — ABNORMAL HIGH (ref 11.5–14.5)
WBC: 11.4 10*3/uL — ABNORMAL HIGH (ref 3.6–11.0)

## 2014-04-23 LAB — COMPREHENSIVE METABOLIC PANEL
ALK PHOS: 105 U/L
Albumin: 3.3 g/dL — ABNORMAL LOW (ref 3.4–5.0)
Anion Gap: 4 — ABNORMAL LOW (ref 7–16)
BILIRUBIN TOTAL: 0.2 mg/dL (ref 0.2–1.0)
BUN: 4 mg/dL — ABNORMAL LOW (ref 7–18)
CO2: 31 mmol/L (ref 21–32)
CREATININE: 0.73 mg/dL (ref 0.60–1.30)
Calcium, Total: 9.4 mg/dL (ref 8.5–10.1)
Chloride: 104 mmol/L (ref 98–107)
Glucose: 92 mg/dL (ref 65–99)
Osmolality: 274 (ref 275–301)
Potassium: 3.9 mmol/L (ref 3.5–5.1)
SGOT(AST): 36 U/L (ref 15–37)
SGPT (ALT): 38 U/L (ref 12–78)
Sodium: 139 mmol/L (ref 136–145)
Total Protein: 7.3 g/dL (ref 6.4–8.2)

## 2014-04-23 LAB — TROPONIN I: Troponin-I: <0.02 ng/mL

## 2014-04-23 LAB — PRO B NATRIURETIC PEPTIDE: B-TYPE NATIURETIC PEPTID: 138 pg/mL — AB (ref 0–125)

## 2014-04-25 ENCOUNTER — Ambulatory Visit (HOSPITAL_BASED_OUTPATIENT_CLINIC_OR_DEPARTMENT_OTHER): Payer: Medicare Other | Admitting: Physical Medicine & Rehabilitation

## 2014-04-25 ENCOUNTER — Encounter: Payer: Self-pay | Admitting: Physical Medicine & Rehabilitation

## 2014-04-25 ENCOUNTER — Encounter: Payer: Medicare Other | Attending: Physical Medicine & Rehabilitation

## 2014-04-25 VITALS — BP 167/95 | HR 112 | Resp 16 | Ht 65.0 in | Wt 267.0 lb

## 2014-04-25 DIAGNOSIS — M25569 Pain in unspecified knee: Secondary | ICD-10-CM | POA: Insufficient documentation

## 2014-04-25 DIAGNOSIS — G894 Chronic pain syndrome: Secondary | ICD-10-CM | POA: Insufficient documentation

## 2014-04-25 DIAGNOSIS — M47817 Spondylosis without myelopathy or radiculopathy, lumbosacral region: Secondary | ICD-10-CM | POA: Insufficient documentation

## 2014-04-25 DIAGNOSIS — M76899 Other specified enthesopathies of unspecified lower limb, excluding foot: Secondary | ICD-10-CM

## 2014-04-25 DIAGNOSIS — M542 Cervicalgia: Secondary | ICD-10-CM | POA: Insufficient documentation

## 2014-04-25 DIAGNOSIS — G620 Drug-induced polyneuropathy: Secondary | ICD-10-CM | POA: Insufficient documentation

## 2014-04-25 DIAGNOSIS — M79609 Pain in unspecified limb: Secondary | ICD-10-CM | POA: Insufficient documentation

## 2014-04-25 DIAGNOSIS — M25559 Pain in unspecified hip: Secondary | ICD-10-CM | POA: Insufficient documentation

## 2014-04-25 DIAGNOSIS — M549 Dorsalgia, unspecified: Secondary | ICD-10-CM | POA: Insufficient documentation

## 2014-04-25 DIAGNOSIS — T451X5A Adverse effect of antineoplastic and immunosuppressive drugs, initial encounter: Secondary | ICD-10-CM | POA: Insufficient documentation

## 2014-04-25 DIAGNOSIS — IMO0001 Reserved for inherently not codable concepts without codable children: Secondary | ICD-10-CM | POA: Insufficient documentation

## 2014-04-25 DIAGNOSIS — R209 Unspecified disturbances of skin sensation: Secondary | ICD-10-CM | POA: Insufficient documentation

## 2014-04-25 DIAGNOSIS — Z79899 Other long term (current) drug therapy: Secondary | ICD-10-CM | POA: Insufficient documentation

## 2014-04-25 NOTE — Patient Instructions (Signed)
May repeat hip injections in 3 months.  If you wish to have a shoulder injection you can schedule this in 6 weeks

## 2014-04-25 NOTE — Progress Notes (Signed)
Trochanteric bursa injection With  ultrasound guidance  Indication Trochanteric bursitis. Exam has tenderness over the greater trochanter of the hip. Pain has not responded to conservative care such as exercise therapy and oral medications. Pain interferes with sleep or with mobility Informed consent was obtained after describing risks and benefits of the procedure with the patient these include bleeding bruising and infection. Patient has signed written consent form. Patient placed in a right  lateral decubitus position with the left hip superior. Ultrasound scanning identified greater trochanter ,area marked and prepped with Betadine and entered with a needle to bone contact. Needle slightly withdrawn then 6mg  of betamethasone with 4 cc 1% lidocaine were injected., The patient was then placed into a left lateral decubitus position and the right hip was injected using the same technique and needle. 80 mm echo block needle. Skin and subcutaneous tissues were anesthetize with 1% lidocaine x1.5 mL on each side under ultrasound guidance Patient tolerated procedure well. Post procedure instructions given.

## 2014-05-04 ENCOUNTER — Ambulatory Visit (INDEPENDENT_AMBULATORY_CARE_PROVIDER_SITE_OTHER): Payer: Medicare Other | Admitting: Cardiovascular Disease

## 2014-05-04 ENCOUNTER — Telehealth: Payer: Self-pay | Admitting: Family Medicine

## 2014-05-04 ENCOUNTER — Encounter: Payer: Self-pay | Admitting: Cardiovascular Disease

## 2014-05-04 ENCOUNTER — Telehealth: Payer: Self-pay

## 2014-05-04 ENCOUNTER — Telehealth: Payer: Self-pay | Admitting: *Deleted

## 2014-05-04 VITALS — BP 130/92 | HR 112 | Ht 65.0 in | Wt 264.2 lb

## 2014-05-04 DIAGNOSIS — I159 Secondary hypertension, unspecified: Secondary | ICD-10-CM

## 2014-05-04 DIAGNOSIS — R0989 Other specified symptoms and signs involving the circulatory and respiratory systems: Secondary | ICD-10-CM

## 2014-05-04 DIAGNOSIS — R000 Tachycardia, unspecified: Secondary | ICD-10-CM

## 2014-05-04 DIAGNOSIS — R0609 Other forms of dyspnea: Secondary | ICD-10-CM

## 2014-05-04 DIAGNOSIS — I4891 Unspecified atrial fibrillation: Secondary | ICD-10-CM

## 2014-05-04 DIAGNOSIS — R0602 Shortness of breath: Secondary | ICD-10-CM

## 2014-05-04 DIAGNOSIS — I158 Other secondary hypertension: Secondary | ICD-10-CM

## 2014-05-04 DIAGNOSIS — R0600 Dyspnea, unspecified: Secondary | ICD-10-CM

## 2014-05-04 MED ORDER — CARVEDILOL 6.25 MG PO TABS: 6.2500 mg | ORAL_TABLET | Freq: Two times a day (BID) | ORAL | Status: AC

## 2014-05-04 MED ORDER — CARVEDILOL 6.25 MG PO TABS: 6.2500 mg | ORAL_TABLET | Freq: Two times a day (BID) | ORAL | Status: DC

## 2014-05-04 NOTE — Assessment & Plan Note (Signed)
I requested TSH to evaluate thyroid function.

## 2014-05-04 NOTE — Telephone Encounter (Signed)
She should still plan to f/u with me as we were going to check BMET and f/u on her anxiety.  [name], MD PGY-2, [place]

## 2014-05-04 NOTE — Assessment & Plan Note (Signed)
The patient is having worsening dyspnea on sinus tachycardia. I recommend an echocardiogram to evaluate LV systolic function and pulmonary pressure. She did have previous DVT but has no signs of that currently. There is no strong suspicion for acute pulmonary embolism as symptoms have been progressive over a few months. If echocardiogram shows pulmonary hypertension, this can be evaluated.

## 2014-05-04 NOTE — Telephone Encounter (Signed)
Pt is fine with coming in tomorrow but her appt has already been cancelled.  Your schedule is already double booked and that time slot is already taken. Kapena Hamme,CMA

## 2014-05-04 NOTE — Telephone Encounter (Signed)
error 

## 2014-05-04 NOTE — Telephone Encounter (Signed)
Appt made for 05-08-14.  Jazmin Hartsell,CMA

## 2014-05-04 NOTE — Telephone Encounter (Signed)
LM for patient to call and schedule an appt to see Dr. Darene Lamer.  Mentioned in message that there was an opening on Monday afternoon.  Thanks [employer]

## 2014-05-04 NOTE — Telephone Encounter (Signed)
Pt called and states she needs a different rx for Carvedilol. States 28 pills will only last her 2 weeks. Please call.

## 2014-05-04 NOTE — Patient Instructions (Signed)
Your physician has requested that you have an echocardiogram. Echocardiography is a painless test that uses sound waves to create images of your heart. It provides your doctor with information about the size and shape of your heart and how well your heart's chambers and valves are working. This procedure takes approximately one hour. There are no restrictions for this procedure.   Your physician recommends that you have labs today:  TSH    Your physician has recommended you make the following change in your medication:  Stop Amlodipine  Start Carvedilol 6.25 mg twice daily   Your physician wants you to follow-up in: 6 months with Dr. Fletcher Anon. You will receive a reminder letter in the mail two months in advance. If you don't receive a letter, please call our office to schedule the follow-up appointment.

## 2014-05-04 NOTE — Progress Notes (Signed)
HPI  Madison [name] is a 38 y/o woman who is here today for evaluation of tachycardia, dyspnea and hypertension. She has known history of breast CA, morbid obesity, anxiety/depession, seizure d/o, fibromyalgia and ongoing tobacco use.  She was seen by Dr. Haroldine Laws most recently in 2012 for dyspnea. Echocardiogram showed EF 55% with no regional wall motion abnormalities. BNP normal. She underwent chemotherapy and left radical mastectomy for breast cancer. Has had f/u imaging studies during chemo and last echo 6/11 EF 65%.  Previously underwent EP procedure by Dr. 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 Post procedure c/b venous clot. On coumadin x 3 months.  She reports developing hypertension as a result of treatment with Avastin. She now reports worsening dyspnea and tachycardia over the last few months. No orthopnea or PND. She reports chronic chest pain related to costochondritis.   Allergies  Allergen Reactions  . Taxol [Paclitaxel]   . Tramadol   . Ultram [Tramadol Hcl]      Current Outpatient Prescriptions on File Prior to Visit  Medication Sig Dispense Refill  . AMBULATORY NON FORMULARY MEDICATION Knee-high graduated compression stockings. Apply to lower extremities.  1 each  0  . amitriptyline (ELAVIL) 100 MG tablet Take 1 tablet (100 mg total) by mouth at bedtime.  60 tablet  5  . anastrozole (ARIMIDEX) 1 MG tablet Take 1 tablet (1 mg total) by mouth daily.  90 tablet  12  . antiseptic oral rinse (BIOTENE) LIQD 15 mLs by Mouth Rinse route as needed for dry mouth.      . captopril-hydrochlorothiazide (CAPOZIDE) 25-25 MG per tablet Take 1 tablet by mouth 2 (two) times daily.  60 tablet  5  . clonazePAM (KLONOPIN) 0.5 MG tablet Take 0.5-1 mg by mouth 2 (two) times daily.      . Diazepam (DIASTAT ACUDIAL) 20 MG GEL Place 20 mg rectally as needed (for seizures longer than 5 minutes or seizures clusters.).  2 each  3  . DULoxetine (CYMBALTA) 20 MG capsule Take 2 capsules (40  mg total) by mouth 2 (two) times daily.  120 capsule  2  . gabapentin (NEURONTIN) 300 MG capsule Take 900 mg by mouth 3 (three) times daily.       Marland Kitchen HYDROcodone-acetaminophen (NORCO) 10-325 MG per tablet Take 1 tablet by mouth every 6 (six) hours as needed.  30 tablet  0  . lamoTRIgine (LAMICTAL) 200 MG tablet Take 600 mg by mouth 2 (two) times daily.       Marland Kitchen levETIRAcetam (KEPPRA) 1000 MG tablet Take 2,000 mg by mouth 2 (two) times daily. Rx by Dr. Ginny Forth, [facility]      . lidocaine (XYLOCAINE) 2 % solution Take 20 mLs by mouth as needed. Pt states she uses after a seizure.      . Melatonin 10 MG TABS Take 1 tablet by mouth at bedtime.      Marland Kitchen omeprazole (PRILOSEC) 20 MG capsule Take 1 capsule (20 mg total) by mouth daily. for up to 5 months, then we will re-evaluate.  60 capsule  2  . ondansetron (ZOFRAN) 4 MG tablet Take 4 mg by mouth every 8 (eight) hours as needed.      . ranitidine (ZANTAC) 150 MG tablet Take 150 mg by mouth as needed.       . clonazePAM (KLONOPIN) 1 MG disintegrating tablet Take 2 tablets (2 mg total) by mouth 2 (two) times daily as needed.  60 tablet  3   Current Facility-Administered  Medications on File Prior to Visit  Medication Dose Route Frequency Provider Last Rate Last Dose  . heparin lock flush 100 unit/mL  500 Units Intravenous Once Eston Esters, MD      . sodium chloride 0.9 % injection 10 mL  10 mL Intravenous PRN Eston Esters, MD      . sodium chloride 0.9 % injection 10 mL  10 mL Intravenous PRN Chauncey Cruel, MD   10 mL at 10/18/13 1434     Past Medical History  Diagnosis Date  . Tobacco user   . Grand mal seizure   . Fibromyalgia   . Seizures   . Breast CA   . GERD (gastroesophageal reflux disease)   . Depression   . Arthritis   . Allergy   . Hypertension      Past Surgical History  Procedure Laterality Date  . Appendectomy    . Tonsillectomy    . Tubal ligation    . Mastectomy      bilateral  . Cholecystectomy  March 2011  .  Abdominal hysterectomy  March 2012  . Breast surgery      bilateral mastectomy  . Port-a-cath insertion       Family History  Problem Relation Age of Onset  . Diabetes Sister   . Hypertension Maternal Grandfather      History   Social History  . Marital Status: Legally Separated    Spouse Name: N/A    Number of Children: N/A  . Years of Education: N/A   Occupational History  . Not on file.   Social History Main Topics  . Smoking status: Current Every Day Smoker -- 1.00 packs/day for 25 years    Types: Cigarettes  . Smokeless tobacco: Never Used     Comment: 1 ppd for 20 years   . Alcohol Use: No  . Drug Use: No  . Sexual Activity: Not Currently    Birth Control/ Protection: Surgical   Other Topics Concern  . Not on file   Social History Narrative   Living w parents in [place] due to cancer and inability to take car for herself or others. Has 3 children. 66 yo has bipolar and MR, currently in foster home. Other 2 live with her. Divorced.      ROS A 10 point review of system was performed. It is negative other than that mentioned in the history of present illness.   PHYSICAL EXAM   BP 130/92  Pulse 112  Ht 5\' 5"  (1.651 m)  Wt 264 lb 4 oz (119.863 kg)  BMI 43.97 kg/m2 Constitutional: She is oriented to person, place, and time. She appears well-developed and well-nourished. No distress.  HENT: No nasal discharge.  Head: Normocephalic and atraumatic.  Eyes: Pupils are equal and round. No discharge.  Neck: Normal range of motion. Neck supple. No JVD present. No thyromegaly present.  Cardiovascular: Tachycardic, regular rhythm, normal heart sounds. Exam reveals no gallop and no friction rub. No murmur heard.  Pulmonary/Chest: Effort normal and breath sounds normal. No stridor. No respiratory distress. She has no wheezes. She has no rales. She exhibits no tenderness.  Abdominal: Soft. Bowel sounds are normal. She exhibits no distension. There is no tenderness. There  is no rebound and no guarding.  Musculoskeletal: Normal range of motion. She exhibits no edema and no tenderness.  Neurological: She is alert and oriented to person, place, and time. Coordination normal.  Skin: Skin is warm and dry. No rash noted. She is  not diaphoretic. No erythema. No pallor.  Psychiatric: She has a normal mood and affect. Her behavior is normal. Judgment and thought content normal.     EKG: Sinus  Tachycardia  -RSR(V1) -nondiagnostic.   PROBABLY NORMAL   ASSESSMENT AND PLAN

## 2014-05-04 NOTE — Telephone Encounter (Signed)
OK can you make her a f/u next week with me? If not, whenever she can next get in is fine. It is not a good idea to triple book her. Thanks.  [name], MD

## 2014-05-04 NOTE — Telephone Encounter (Signed)
Informed patient that RX has been resent

## 2014-05-04 NOTE — Telephone Encounter (Signed)
Madison [name] went to the cardiologist today and was given new medication Carvedilol for bp and rapid heart rate and was taken off the Norvasc.  Also had the same procedure done there that she would have had for her visit with you tomorrow.  Will cancel Friday's visit.

## 2014-05-04 NOTE — Assessment & Plan Note (Signed)
Given for resting tachycardia, I stopped amlodipine and switched to carvedilol 6.25 mg twice daily.

## 2014-05-05 ENCOUNTER — Encounter: Payer: Self-pay | Admitting: Physical Medicine & Rehabilitation

## 2014-05-05 ENCOUNTER — Ambulatory Visit: Payer: Self-pay | Admitting: Family Medicine

## 2014-05-05 LAB — TSH: TSH: 1.23 u[IU]/mL (ref 0.450–4.500)

## 2014-05-08 ENCOUNTER — Ambulatory Visit (INDEPENDENT_AMBULATORY_CARE_PROVIDER_SITE_OTHER): Payer: Medicare Other | Admitting: Family Medicine

## 2014-05-08 ENCOUNTER — Encounter: Payer: Self-pay | Admitting: Family Medicine

## 2014-05-08 VITALS — BP 134/89 | HR 106 | Temp 98.9°F | Ht 65.0 in | Wt 262.2 lb

## 2014-05-08 DIAGNOSIS — F411 Generalized anxiety disorder: Secondary | ICD-10-CM

## 2014-05-08 DIAGNOSIS — K219 Gastro-esophageal reflux disease without esophagitis: Secondary | ICD-10-CM

## 2014-05-08 DIAGNOSIS — I159 Secondary hypertension, unspecified: Secondary | ICD-10-CM

## 2014-05-08 DIAGNOSIS — I158 Other secondary hypertension: Secondary | ICD-10-CM

## 2014-05-08 DIAGNOSIS — G8929 Other chronic pain: Secondary | ICD-10-CM

## 2014-05-08 MED ORDER — HYDROCODONE-ACETAMINOPHEN 10-325 MG PO TABS: 1.0000 | ORAL_TABLET | Freq: Four times a day (QID) | ORAL | Status: DC | PRN

## 2014-05-08 MED ORDER — OMEPRAZOLE 20 MG PO CPDR: 20.0000 mg | DELAYED_RELEASE_CAPSULE | Freq: Every day | ORAL | Status: DC

## 2014-05-08 MED ORDER — MAGIC MOUTHWASH: 5.0000 mL | Freq: Three times a day (TID) | ORAL | Status: DC | PRN

## 2014-05-08 MED ORDER — LIDOCAINE 5 % EX PTCH: 1.0000 | MEDICATED_PATCH | CUTANEOUS | Status: DC

## 2014-05-08 NOTE — Progress Notes (Signed)
Patient ID: Madison [name], female   DOB: 1976-07-03, 38 y.o.   MRN: [id number] Subjective:   CC: Follow up HTN, anxiety, and pain  HPI:   HTN Patient has been taking captopril-HCTZ 25-25mg  BID, and cardiologist stopped norvasc and added carvedilol for pulse control. She reports feeling tired with this but otherwise no complaints and takes daily. BP at home is 140s/<80s. Chest still has the "beating hard" sensation of heart beat but is improving. Denies new chest pain, dyspnea, dizziness, or fainting. BMET checked by oncology recently was normal.  Anxiety Patient taking cymbalta 60mg  PM and 20mg  AM and feels okay with this regimen. She is interested in switching from klonopin to ativan which worked faster and wore off faster for her in the past. She has not seen Dr Madison [name] because of insurance issue. She still has tremors "all the time" that she attributes to her anxiety.  Pain "All over" pain is present constantly and worse after seizures. She reports taht usually, flare is worse in legs and low back after seizure. Prior to seizure she used vicodin 2-3 times weekly. After seizure last week, she used 1 tab every 4 hours. Over the past week, pain has improved but she still has significant left leg sciatica. She is frustrated that she cannot take NSAIDs (due to persistent HTN). Dr Madison [name] with pain management has told pt he is at the end of what he can do for her, per pt. She walks a lot, doing home PT exercises q other day. Swelling has come down in legs since being on lyrica.   Review of Systems - Per HPI. Additionally, patient would like prilosec refilled and wants prescription for magic mouthwash due to biting her tongue during a seizure. She also requests I check her ear due to odd sinus sensation.  PMH: Recent to [facility] with leg pain - Per pt, LE duplex Neg for clot Last saw neurologist in May; next appt in Aug.  Meds: Neurologist is weaning pt off lyrica and onto  neurontin.  Objective:  Physical Exam BP 134/89  Pulse 106  Temp(Src) 98.9 F (37.2 C) (Oral)  Ht 5\' 5"  (1.651 m)  Wt 262 lb 3.2 oz (118.933 kg)  BMI 43.63 kg/m2 GEN: NAD, seated in office HEENT: AT/[place], sclera clear, EOMI, TMs clear bilaterally with no erythema or exudate CV: RRR PULM: Normal effort EXTR: No asymmetry or erythema PSYCH: Mood and affect euthymic, normal rate and volume of speech NEURO: Awake, alert, normal speech, occasional fine tremor in arms and legs, normal gait that is slowed and pt uses walker.    Assessment:     Madison [name] is a 38 y.o. female with h/o HTN, epileptiform activity NOS, reflux, insomnia NOS, obesity, chronic pain, and depressive disorder and anxiety here for follow up.    Plan:     HTN BP and pulse improved on coreg (switched 6/4 by cards from amlodipine) and captopril-HCTZ. BMET normal. - Continue taking this regimen. - Continue checking at home.  Anxiety Stable, and likely contributes to her pain and "tremor" symptoms. - Continue cymbalta 80mg  daily (insurance has approved 60mg  PM and 20mg  AM dosing). - Discussed that short-acting benzos have increased habit-forming risk. - Need f/u to further evaluate patient's anxiety disorder (GAD vs panic attacks vs alternative dx). - Emphasized importance of therapy plus medication. Pt to attempt to find out about insurance coverage and let me know so we can move forward with Dr Madison [name] vs another psychologist depending on what  she finds.  Chronic pain Patient has chronic pain "all over" along with obesity and seizure-like activity (description resembles tonic-clonic seizures) that limits her ability to stay active. - Vicodin re-prescribed. Re-emphasized this is not good long-term solution for chronic pain. - At f/u, cut back. - Lidocain patch prescribed for most painful areas PRN. - Heat and ice. - Continue activity and stay motivated to do home PT that you have learned. - Eventually, once  HTN better controlled, we can consider occasional NSAID as it did help pt. - Magic mouthwash prescribed per pt request due to biting tongue during sz. - Will need to ask neurologist for records. Will obtain ROI from pt at f/u.  Reflux Not re-evaluated recently. Ran out of prilosec. Had wanted to re-evaluate if pt is still needing this. - Re-prescribed for 1 month. - Pt understands she is to set up a f/u appt within that time to evaluate reflux.  Sinus symptoms Not fully evaluated but per request checked TMs which were clear. - Return precautions if develops pain or fever discussed.   # Health Maintenance: Not discussed  Follow-up: Follow up in 1 month for f/u of reflux.   Madison Sinclair, MD [place]

## 2014-05-09 ENCOUNTER — Encounter: Payer: Self-pay | Admitting: *Deleted

## 2014-05-09 NOTE — Progress Notes (Signed)
Prior Authorization received from [facility] for Lidocaine 5% patch. No formulary listed and PA form placed in provider box for completion. Derl Barrow, RN

## 2014-05-11 NOTE — Assessment & Plan Note (Signed)
Patient has chronic pain "all over" along with obesity and seizure-like activity (description resembles tonic-clonic seizures) that limits her ability to stay active. - Vicodin re-prescribed. Re-emphasized this is not good long-term solution for chronic pain. - At f/u, cut back. - Lidocain patch prescribed for most painful areas PRN. - Heat and ice. - Continue activity and stay motivated to do home PT that you have learned. - Eventually, once HTN better controlled, we can consider occasional NSAID as it did help pt. - Magic mouthwash prescribed per pt request due to biting tongue during sz. - Will need to ask neurologist for records. Will obtain ROI from pt at f/u.

## 2014-05-11 NOTE — Assessment & Plan Note (Addendum)
BP and pulse improved on coreg (switched 6/4 by cards from amlodipine) and captopril-HCTZ. BMET normal. - Continue taking this regimen. - Continue checking at home.

## 2014-05-11 NOTE — Assessment & Plan Note (Signed)
Not re-evaluated recently. Ran out of prilosec. Had wanted to re-evaluate if pt is still needing this. - Re-prescribed for 1 month. - Pt understands she is to set up a f/u appt within that time to evaluate reflux.

## 2014-05-11 NOTE — Assessment & Plan Note (Signed)
Stable, and likely contributes to her pain and "tremor" symptoms. - Continue cymbalta 80mg  daily (insurance has approved 60mg  PM and 20mg  AM dosing). - Discussed that short-acting benzos have increased habit-forming risk. - Need f/u to further evaluate patient's anxiety disorder (GAD vs panic attacks vs alternative dx). - Emphasized importance of therapy plus medication. Pt to attempt to find out about insurance coverage and let me know so we can move forward with Dr Gwenlyn Saran vs another psychologist depending on what she finds.

## 2014-05-19 ENCOUNTER — Other Ambulatory Visit (INDEPENDENT_AMBULATORY_CARE_PROVIDER_SITE_OTHER): Payer: Medicare Other

## 2014-05-19 ENCOUNTER — Other Ambulatory Visit: Payer: Self-pay

## 2014-05-19 ENCOUNTER — Encounter: Payer: Self-pay | Admitting: Family Medicine

## 2014-05-19 DIAGNOSIS — R0602 Shortness of breath: Secondary | ICD-10-CM

## 2014-05-19 DIAGNOSIS — I4891 Unspecified atrial fibrillation: Secondary | ICD-10-CM

## 2014-05-19 NOTE — Progress Notes (Signed)
Patient ID: Madison [name], female   DOB: Jul 27, 1976, 38 y.o.   MRN: [id number]  Received notification from [facility] that patient has been discharged from [facility] due to goals being met.  [name], MD PGY-2, [place]

## 2014-05-23 ENCOUNTER — Telehealth: Payer: Self-pay | Admitting: Family Medicine

## 2014-05-23 NOTE — Telephone Encounter (Signed)
Has a sinus infection and has had a seizure She has an appt on Friday Can she get something called in before then?

## 2014-05-24 MED ORDER — AMOXICILLIN-POT CLAVULANATE 875-125 MG PO TABS: 1.0000 | ORAL_TABLET | Freq: Two times a day (BID) | ORAL | Status: DC

## 2014-05-24 MED ORDER — FLUTICASONE PROPIONATE 50 MCG/ACT NA SUSP: 2.0000 | Freq: Every day | NASAL | Status: DC

## 2014-05-24 NOTE — Telephone Encounter (Signed)
Call to discuss with patient. she reports 3 days of congestion, mouth breathing, and fever with a TMax to 102 degrees farenheit. She has been able to keep down fluids but has been nauseated today. She has not had diarrhea. She does not report shortness of breath and is able to speak to me in full sentences over the phone. She has had 2 seizures and called her neurologist who feels this is likely related to current illness.   Given her seizure disorder but more importantly her h/o breast cancer, will treat more aggressively with antibiotics at this time. - Augmentin 875/125mg  BID x 7 days. - Push fluids, including gatorade with electrolytes, even if decreased appetite for food. - Rest, hand washing. - Neti pot and humidified air. Pt is currently using nasal saline spray. - Fluticasone nasal spray. - She knows she needs eval in office. She has appt in 2 days. We have had trouble agenda-setting in the past and she is aware that we will need to spend some time to discuss this current illness at this upcoming appt. - Return precautions reviewed.  [name], MD PGY-2, [place]

## 2014-05-24 NOTE — Telephone Encounter (Signed)
Will forward to MD again. Jazmin Hartsell,CMA  

## 2014-05-24 NOTE — Telephone Encounter (Signed)
Pt called back. °

## 2014-05-26 ENCOUNTER — Encounter: Payer: Self-pay | Admitting: Family Medicine

## 2014-05-26 ENCOUNTER — Ambulatory Visit (INDEPENDENT_AMBULATORY_CARE_PROVIDER_SITE_OTHER): Payer: Medicare Other | Admitting: Family Medicine

## 2014-05-26 VITALS — BP 147/82 | HR 99 | Temp 98.0°F | Ht 65.0 in | Wt 267.0 lb

## 2014-05-26 DIAGNOSIS — J018 Other acute sinusitis: Secondary | ICD-10-CM

## 2014-05-26 DIAGNOSIS — K219 Gastro-esophageal reflux disease without esophagitis: Secondary | ICD-10-CM

## 2014-05-26 DIAGNOSIS — J019 Acute sinusitis, unspecified: Secondary | ICD-10-CM | POA: Insufficient documentation

## 2014-05-26 DIAGNOSIS — F411 Generalized anxiety disorder: Secondary | ICD-10-CM

## 2014-05-26 MED ORDER — FLUCONAZOLE 150 MG PO TABS: 150.0000 mg | ORAL_TABLET | Freq: Once | ORAL | Status: DC

## 2014-05-26 MED ORDER — OMEPRAZOLE 40 MG PO CPDR: 40.0000 mg | DELAYED_RELEASE_CAPSULE | Freq: Every day | ORAL | Status: DC

## 2014-05-26 NOTE — Assessment & Plan Note (Signed)
Symptoms are subjectively improving and objectively there is no sign of oropharyngeal purulent or bacterial otitis media. Lungs are clear and patient is breathing easily. Vitals are stable and she is afebrile here. -Continue 4 more days of seven-day course of Augmentin (started in patient with h/o breast cancer in favor of watchful waiting). -Diflucan prescription provided in case patient is to develop a yeast infection, per her request. -Return precautions reviewed. Followup middle to late next week if symptoms aren't no longer improving or sooner if they worsen. -Discussed same-day or urgent care possibility if symptoms worsen. Also discussed that the phone tree is not a means for urgent communication.

## 2014-05-26 NOTE — Assessment & Plan Note (Signed)
Did not get a chance to talk much about this today, but would benefit her last office visit note  From a clear understanding of her anxiety disorder. -Followup in 2 weeks to discuss.

## 2014-05-26 NOTE — Assessment & Plan Note (Addendum)
Ms. Madison [name] would benefit from following up with GI, but at this time she would rather try increasing the dose of Prilosec. -Increase Prilosec to 40 mg daily for 6 weeks trial. -We could try Nexium again but insurance is unlikely to approve at this time. If she fails Prilosec we may go this route. -She is to followup with me in 2 weeks to see how this is doing, and if no improvement we will switch to Nexium early. -If increased PPI trial is not improving symptoms she is agreeable to following up with GI. Her mother sees Madison [name] and that is who she would prefer. -Also discussed that she will get maximal benefit if she cuts back on caffeine and cigarette use. She is not willing to do that at this time but will think about it.

## 2014-05-26 NOTE — Patient Instructions (Addendum)
Follow up mid-late next week if you are not feeling better. Continue hydrating and resting. If you get high fevers or feel worse, seek immediate care at our sameday clinic or Urgent Care.  For your reflux, we are trying prilosec 40mg  daily. This will not get better if you do not work on cutting down on caffeine and cigarettes. Pick one and try your best to cut back.  If you are feeling better, plan to follow up with me in 2 weeks to discuss anxiety. I think seeing each other every 2-3 weeks will also help to allow [place] to talk about all we need to, in addition to trying for 30 minute slots for each visit.  Best,  [name], MD

## 2014-05-26 NOTE — Progress Notes (Signed)
Patient ID: Madison [name], female   DOB: 1976/04/06, 38 y.o.   MRN: [id number] Subjective:   CC: followup sinusitis, discussed reflux  HPI:    Follow up sinusitis Patient called on the 23rd and had reported 3 days of symptoms at that time. With her history of breast cancer we decided to start Augmentin. She has been taking this for the past 3 days and has 4 days of treatment left. Symptoms are slowly improving. She has not had any more fevers. She is still not able to breathe through her nose but continues trying to use nasal saline and a 90 pots. She has also had to use Aleve D. For her symptoms, that she does not plan to use this long-term. She is staying at hydrated as possible but occasionally her urine is dark. She has Zofran at home for nausea. She has had 2 seizures during the first 2 days of illness.  Followup reflux Patient has been taking Prilosec 20 mg daily in the morning and a ranitidine in the evenings when she lays down. Symptoms are worse with lying down. Triggers include spicy foods and alcohol which she has cut out. She still eats some chocolate and drinks 2-3 cups of coffee a day and tea. She drinks one small cup of caffeinated soda every other day. She smokes one pack per day. She does not feel ready to cut back on caffeine or cigarettes. She initially doubled reflux issues greater than 5 years ago when she started chemotherapy, and was told that she had at GI tract ulcer based on an endoscopy. For the past 6 months we have been trialing PPI therapy with Prilosec, since her insurance was not approving Nexium. She does feel that Nexium worked better. She is interested in increasing Prilosec to see if that helps.she has not had any emesis but has had a little bit of regurgitation and throat burning sensation.   Review of Systems - Per HPI.  PMH:  Smoking status: 10 cig/day    Objective:  Physical Exam BP 147/82  Pulse 99  Temp(Src) 98 F (36.7 C) (Oral)  Ht 5\' 5"  (1.651  m)  Wt 267 lb (121.11 kg)  BMI 44.43 kg/m2 GEN: NAD Cardiovascular: Regular rhythm, mild tachycardia to 100s, no m/r/g Pulmonary:CTAB, no wheezes or crackles    HEENT: TM mild retracted and clear bilaterally with no erythema and not pearly, O/p clear,  With mild erythema; neck supple, No LAD Abdomen: Abd generalized discomfort, worse in bilateral lower quadrant tender Neuro: Awake, alert, able to walk with a walker, normal speech Psych: Mood and affect are mildly anxious today but pleasant, speech and thought processes are goal oriented    Assessment:     Madison [name] is a 38 y.o. female with complex past medical history including history of breast cancer here for followup of sinusitis and reevaluation of reflux.    Plan:     Follow up sinusitis Symptoms are subjectively improving and objectively there is no sign of oropharyngeal purulent or bacterial otitis media. Lungs are clear and patient is breathing easily. Vitals are stable and she is afebrile here. -Continue 4 more days of seven-day course of Augmentin. -Diflucan prescription provided in case patient is to develop a yeast infection, per her request. -Return precautions reviewed. Followup middle to late next week if symptoms aren't no longer improving or sooner if they worsen. -Discussed same-day or urgent care possibility if symptoms worsen. Also discussed that the phone tree is not a means  for urgent communication.  F/u reflux Ms. Madison [name] would benefit from following up with GI, but at this time she would rather try increasing the dose of Prilosec. -Increase Prilosec to 40 mg daily for 6 weeks trial. -We could try Nexium again but insurance is unlikely to approve at this time. If she fails Prilosec we may go this route. -She is to followup with me in 2 weeks to see how this is doing, and if no improvement we will switch to Nexium early. -If increased PPI trial is not improving symptoms she is agreeable to following up with  GI. Her mother sees Dr. Collene Mares and that is who she would prefer. -Also discussed that she will get maximal benefit if she cuts back on caffeine and cigarette use. She is not willing to do that at this time but will think about it.  Anxiety Did not get a chance to talk much about this today, but would benefit her last office visit note  From a clear understanding of her anxiety disorder. -Followup in 2 weeks to discuss.  Appointment times Patient requests 30 minutes slots per appointment. I think this is reasonable given the amount of things we tend to discuss each visit. I have also encouraged her that appointments with me every 2-3 weeks may be more appropriate than more spaced out. She is in agreement. -Will discuss with front office and put a note in epic for 30 minutes slots for her regular PCP followup with the patient. -Patient to followup with me in 2 weeks.  I have spent at least 25 minutes in room with patient, >50% of this time used in counseling.   [name], MD [place]

## 2014-05-26 NOTE — Progress Notes (Deleted)
Patient ID: Madison [name], female   DOB: December 02, 1975, 38 y.o.   MRN: [id number] Subjective:   CC: ***  HPI:   1. ***Sinusitis Aleve D  Neti pot No further fevers Still not nose breathing Still using saline Dark urine Zofran  - Four days left of augmentin - diflucan   Reflux prilosec in AM Takes zantac so when lay down doesn't  Had run out of prilosec. Janu 5  years been on something nexium works better and she would like to try that Scoped >5 years ago - ulcer. Regurgitation No unwished weight loss. Cut out spicy foods, cut out alcohol. Eats some chocolate.  Drinks some caffeine (2-3 coffee), tea Caffeine soda, 1 small cup every other day Smoke 1 pack per day -  prilosec up to 40mg       Review of Systems - Per HPI. Additionally, ***  ***PMH, FH, or SH Smoking status: ***    Objective:  Physical Exam BP 147/82  Pulse 99  Temp(Src) 98 F (36.7 C) (Oral)  Ht 5\' 5"  (1.651 m)  Wt 267 lb (121.11 kg)  BMI 44.43 kg/m2 GEN: ***     Assessment:     Madison [name] is a 38 y.o. female with h/o *** here for ***    Plan:     # See problem list and after visit summary for problem-specific plans. ***  # Health Maintenance: ***  Follow-up: Follow up in *** for ***.   Madison Sinclair, MD [place]

## 2014-06-07 ENCOUNTER — Encounter: Payer: Self-pay | Admitting: Family Medicine

## 2014-06-11 ENCOUNTER — Encounter: Payer: Self-pay | Admitting: Family Medicine

## 2014-06-12 ENCOUNTER — Other Ambulatory Visit: Payer: Self-pay | Admitting: Family Medicine

## 2014-06-12 ENCOUNTER — Telehealth: Payer: Self-pay

## 2014-06-12 NOTE — Progress Notes (Addendum)
Filled out and returned to [place] office. Apologies for delay - paperwork had gotten placed in incorrect location by me and I did not see it until this week.  [name], MD

## 2014-06-12 NOTE — Telephone Encounter (Signed)
Please call and let patient know that I received diflucan refill but we had prescribed this empirically at last visit and if she is still having vaginal symptoms, she needs same-day evaluation today or tomorrow in clinic to find out what is going on. I will not refill at this time. This is the same thing I replied in her MyChart message.  Thx,  [name], MD

## 2014-06-12 NOTE — Telephone Encounter (Signed)
Patient called requesting a neurotomy injection in her left leg for sciatic nerve pain.  Please advise.

## 2014-06-12 NOTE — Telephone Encounter (Signed)
May schedule Left L3,4,5 RF next visit

## 2014-06-12 NOTE — Telephone Encounter (Signed)
Attempted to contact patient. Left a voicemail to return call to clinic to schedule appt. Patient needs appt for Left L3,4,5 RF.

## 2014-06-13 NOTE — Progress Notes (Signed)
PA for Lidoderm denied per [employer].  Chronic pain is not a medically accepted indication for this medication.  [facility] pharmacy notified.  Derl Barrow, RN

## 2014-06-13 NOTE — Progress Notes (Signed)
PA faxed to [facility] for review.  Derl Barrow, RN'

## 2014-06-19 NOTE — Progress Notes (Signed)
Patient ID: Madison [name], female   DOB: [birth year], 38 y.o.   MRN: [id number]  Letter sent to patient to notify her.  [name], MD

## 2014-06-22 ENCOUNTER — Telehealth: Payer: Self-pay | Admitting: Family Medicine

## 2014-06-22 ENCOUNTER — Encounter: Payer: Self-pay | Admitting: Family Medicine

## 2014-06-22 ENCOUNTER — Ambulatory Visit (INDEPENDENT_AMBULATORY_CARE_PROVIDER_SITE_OTHER): Payer: Medicare Other | Admitting: Family Medicine

## 2014-06-22 VITALS — BP 144/82 | HR 96 | Wt 263.0 lb

## 2014-06-22 DIAGNOSIS — Z9181 History of falling: Secondary | ICD-10-CM

## 2014-06-22 DIAGNOSIS — F3289 Other specified depressive episodes: Secondary | ICD-10-CM

## 2014-06-22 DIAGNOSIS — F411 Generalized anxiety disorder: Secondary | ICD-10-CM

## 2014-06-22 DIAGNOSIS — R5381 Other malaise: Secondary | ICD-10-CM

## 2014-06-22 DIAGNOSIS — R269 Unspecified abnormalities of gait and mobility: Secondary | ICD-10-CM

## 2014-06-22 DIAGNOSIS — F329 Major depressive disorder, single episode, unspecified: Secondary | ICD-10-CM

## 2014-06-22 DIAGNOSIS — R569 Unspecified convulsions: Secondary | ICD-10-CM

## 2014-06-22 DIAGNOSIS — R296 Repeated falls: Secondary | ICD-10-CM

## 2014-06-22 DIAGNOSIS — R278 Other lack of coordination: Secondary | ICD-10-CM

## 2014-06-22 DIAGNOSIS — R279 Unspecified lack of coordination: Secondary | ICD-10-CM

## 2014-06-22 DIAGNOSIS — R2681 Unsteadiness on feet: Secondary | ICD-10-CM

## 2014-06-22 DIAGNOSIS — G8929 Other chronic pain: Secondary | ICD-10-CM

## 2014-06-22 DIAGNOSIS — F4001 Agoraphobia with panic disorder: Secondary | ICD-10-CM

## 2014-06-22 DIAGNOSIS — Z79899 Other long term (current) drug therapy: Secondary | ICD-10-CM

## 2014-06-22 MED ORDER — HYDROCODONE-ACETAMINOPHEN 10-325 MG PO TABS: 1.0000 | ORAL_TABLET | Freq: Three times a day (TID) | ORAL | Status: DC | PRN

## 2014-06-22 MED ORDER — CYCLOBENZAPRINE HCL 10 MG PO TABS: ORAL_TABLET | ORAL | Status: DC

## 2014-06-22 NOTE — Patient Instructions (Signed)
Good to see you today.  For your dizziness with gabapentin, you can still increase to 1200mg  TID but slower. You decided to decrease lunchtime dose and increase back to 1200mg  later. We refilled norco today. Use as few as possible. You think this amount will last 3 months. I also filled 1 month of flexeril today. For your anxiety, I will do a little research about therapy options. Do not change medication for now. 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 I am including information below. If you have any severe increase in pain, anxiety, or other complaints, seek immediate care. Otherwise, follow up with me in 2 weeks to follow up on other issues (reflux, sinus pain).  Best,  [name], MD  Agoraphobia Agoraphobia is a type of anxiety disorder. Anxiety is intense fear and worry. Agoraphobia is an intense fear of losing control or having a panic attack while in public. During a panic attack, you may experience the following things:  Dizziness.  Shortness of breath.  Chest pain.  Sweating.  Nausea.  Diarrhea.  Choking sensation.  Trembling or shaking.  Fear of going crazy.  Fear of dying. You may fear not being able to escape a situation, being embarrassed, or not having help available to you. This fear can make it difficult to be in crowds, to travel, or even to leave the house. The panic may seem like more than you can cope with.  RISK FACTORS Agoraphobia can affect people at most any age. It usually starts during the teenage years or in young adulthood. Women have agoraphobia more often than men. No one knows exactly what causes agoraphobia. However, there are certain risk factors that are associated with agoraphobia:  Extreme stress, brought on by traumatic situations, such as physical or sexual abuse.  History of drug or alcohol abuse.  Family history of anxiety. SYMPTOMS   Fear of leaving home.  Fear of being  someplace where you cannot get out quickly.  Feelings of helplessness.  Fear of being alone. DIAGNOSIS  To determine if you have agoraphobia, your caregiver will probably ask you about:  Fears you may have.  Changes in your behavior because of these fears.  Drug and alcohol use. TREATMENT  Psychotherapy and medicine can help people with agoraphobia. Medicine can work immediately to reduce symptoms of panic or reduce general levels of anxiety, whereas psychotherapy has been demonstrated to show better long-term outcomes. They often are used together.  Psychotherapy may include:  Cognitive behavioral therapy. This type of therapy helps you figure out what causes your fears. Your fear might be caused by your thoughts or certain situations. The therapy will then help you learn ways to reduce these fears.  Exposure therapy. Often, the more fear is avoided, the greater it becomes. Exposure therapy helps you face the things that cause your fears by learning relaxation techniques while you approach what you fear. Meditation and deep breathing are two examples.  Medicines for agoraphobia may include:  Antidepressants. These drugs affect certain chemicals in your brain. Selective serotonin reuptake inhibitors are one type that can decrease general levels of anxiety and help prevent panic attacks.  Benzodiazepines. These drugs block feelings of anxiety and panic. However, prolonged use can lead to physiological and psychological dependence and can cause withdrawal symptoms when discontinued.  Beta-blockers. These medicines can keep you from getting excited. They have an effect on the nerve cells that cause that feeling. The drugs help you  feel less tense and anxious. HOME CARE INSTRUCTIONS  Take medicine as directed by your caregiver. Follow the directions carefully. Make sure your caregiver knows about all other medicines you take. Do not start any new medicine unless your caregiver says it is  okay. Do not take more medicine than is prescribed. Alert your caregiver if you plan to discontinue a medicine that he or she has prescribed to you for anxiety.  Try not to avoid fearful situations. Avoidance can increase your fears and could lead to a fear of leaving your home.  Learn relaxation techniques. Consider taking a class in meditation or deep breathing to help you stay calm.  Consider joining a support group. Ask your caregiver for a list of groups in your area.  Do not drink alcohol or use drugs, especially if you are taking medicine for anxiety. SEEK MEDICAL CARE IF:  You are missing major life activities, such as school or work, because of your fears.  You have symptoms of depression, such as depressed mood, loss of interest, or negative feelings about yourself. SEEK IMMEDIATE MEDICAL CARE IF:   You cannot leave your home.  You have trouble breathing.  You have chest pain.  You think about hurting yourself or someone else. FOR MORE INFORMATION Anxiety Disorders Association of America: www.adaa.org Document Released: 04/09/2011 Document Revised: 05/18/2012 Document Reviewed: 04/09/2011 [facility] Patient Information 2015 [place], [place]. This information is not intended to replace advice given to you by your health care provider. Make sure you discuss any questions you have with your health care provider.

## 2014-06-22 NOTE — Telephone Encounter (Signed)
Spoke with patient and informed her of possible plan to send to neuro rehab and let them assess her for wheelchair. Josef Tourigny,CMA

## 2014-06-22 NOTE — Progress Notes (Signed)
Patient ID: Madison [name], female   DOB: [birth year], 37 y.o.   MRN: [id number] Subjective:   CC: Anxiety eval and medication review  HPI:   Anxiety Madison [name] is a 38 y.o. female with complex PMH including h/o breast cancer s/p mastectomy, seizure disorder, depression, and anxiety here for evaluation of anxiety. Reported onset 5 years ago with breast cancer dx. She was started on xanax by Dr Truddie Coco, which helped. She has had anxiety regarding all aspects of her life since, including her medical conditions, her daughter, her finances, etc. Over the past 6 months, she has developed anxiety and "panicky feeling" with palpitations and fast breathing surrounding crowds including going to [place] and her daughter's school. She specifically notes worsened symptoms at the busier areas of store. She takes cymbalta, amitriptyline, and klonopin PRN which she ends up taking 1-2 times daily. She is interested in another benzodiazepine as she does not feel klonopin works fast enough.   Medication reconciliation: Patient has recently had some dizziness with increase of her gabapentin from 900mg  TID to 1200mg  TID by neurology. She states dizziness is usually transient after a few days.   * Fall in clinic * In clinic today, she was standing to get a breath mint from her mother, felt dizzy got off-balance, and was unable to pull feet from under her and fell. Fall was witnessed. Patient did not hit her head and stated that nothing else was hurting her after fall. She was able to stand and be seated immediately after in her chair. She denied any increased pain from fall.    Review of Systems - Per HPI.   PMH: Cholecystectomy, hysterectomy Medications: Increased gabapentin this week to 1200mg  TID by neurologist SH: Does not drive Husband who does not help with her medical issues Smoking status: Smoker    Objective:  Physical Exam BP 144/82  Pulse 96  Wt 263 lb (119.296 kg) GEN: NAD PSYCH: Mood and affect  mildly anxious and occasionally tearful HEENT: AT/[place], sclera clear, EOMI GAIT: Uses walker to walk well; when standing at end of visit requires assistance for gait and due to dizziness is seated in wheelchair NEURO: Grossly normal, with occasional mild tremor of hands at baseline, normal speech   Assessment:     Madison [name] is a 38 y.o. female with complex PMH here for medication reconciliation and eval of anxiety.    Plan:     Anxiety Most consistent with GAD and agoraphobia. She reports having therapy in the past which helped but now having insurance coverage issues. She wants something quicker-acting than klonopin. She certainly has medical problems contributing to anxiety, but the reverse is likely also true. - Discussed with Madison [name] and her mother the necessity of therapy, which they both fully agree with. - Will look into therapy options and discuss with Dr Madison [name]. She prefers someone in [facility]. - Discussed that benzos are not a good choice for Madison [name] due to her dizziness, multiple medications, and h/o falls. Discussed habit-forming nature of medication and will not change to shorter-acting.  - Plan to start tapering klonopin down in future. - At f/u, will plan to schedule this as it has a long half-life and is not a good PRN medication. - At f/u, would also consider referral to psychiatry at [facility] who can interact with pt's neurologist, especially if she is unable to taper down on klonopin. - consider increasing cymbalta and discontinuing amitryptiline as it has more SE profile than cymbalta, including dry  mouth and sedation. If she was amenable, would taper down and stop. With cymbalta, we can increase up to 40mg  BID. Could consider increasing daily dose vs night time dose as well if sleep is an issue.  Medication review Madison [name] is on quite a few sedating medications including klonopin, keppra, lamictal, norco, gabapentin, melatonin, cymbalta, amitriptyline, and flexeril.   - We discussed that changing from klonopin to shorter-acting benzo is unsafe in her.  - Would continue an attempt to reduce sedating medications at follow-up, including scheduling benzo, tapering it down slowly, and tapering down amitriptyline and increasing cymbalta if needed (both SNRIs).  - Recent increase of gabapentin to 1200mg  TID. With fall in clinic today, plan to decrease lunchtime dose back to 900mg .  - Will need to discuss serotonin syndrome risk at follow up. - Pt requested one-time refill of flexeril which I filled. Neurologist to fill in future.  - Per request, filled norco for 3 month supply (90 tab). Discussed fall risk with this medication as well.  Fall in clinic with gait and balance instability Likely due to dizziness from recent gabapentin increase. Patient had no preceding chest pain or lightheadedness. BP today was mildly hypertensive. Dizziness resolving prior to patient leaving clinic. No head trauma, syncope, or other known trauma. Pt stated nothing hurt newly after fall. - Recommended decreasing back to 900mg  for lunchtime dose of gabapentin and calling neurologist to discuss today's dizziness. -  Wheelchair was used to transport her out of clinic to car. Mother drove her home. - Pt does not drive, has bars in bathroom, well-lit living space, and 5 days/week CNA.  - Family requesting wheelchair. Will place referral to neuro-rehabilitation for wheelchair eval.  # Health Maintenance: Not discussed.  Follow-up: Follow up planned for every 2 weeks as pt has multiple concerns.  Issues deferred to another visit: Sinusitis: Reportedly feeling better after 14 days of augmentin. Left face/jaw pain Yeast infection: After augmentin. Pt took diflucan.  Reflux: Improving on prilosec 60mg  daily for 6 weeks (increased last visit, should be on this 2 more weeks).   I have spent 45 minutes in room with patient, >50% of this time used in counseling.   Madison Sinclair,  MD [place]

## 2014-06-22 NOTE — Telephone Encounter (Signed)
Pt called again and wold like Jazmin to call her. jw

## 2014-06-22 NOTE — Telephone Encounter (Signed)
Pt called because she forgot to tell the team that she needs her prescription for her wheelchair to go to the [facility] in [place]. Please call her if you have questions. jw

## 2014-06-23 DIAGNOSIS — F4001 Agoraphobia with panic disorder: Secondary | ICD-10-CM | POA: Insufficient documentation

## 2014-06-23 DIAGNOSIS — Z79899 Other long term (current) drug therapy: Secondary | ICD-10-CM | POA: Insufficient documentation

## 2014-06-23 NOTE — Assessment & Plan Note (Signed)
Fall in clinic with gait and balance instability Likely due to dizziness from recent gabapentin increase. Patient had no preceding chest pain or lightheadedness. BP today was mildly hypertensive. Dizziness resolving prior to patient leaving clinic. No head trauma, syncope, or other known trauma. Pt stated nothing hurt newly after fall. - Recommended decreasing back to 900mg  for lunchtime dose of gabapentin and calling neurologist to discuss today's dizziness. -  Wheelchair was used to transport her out of clinic to car. Mother drove her home. - Pt does not drive, has bars in bathroom, well-lit living space, and 5 days/week CNA.  - Family requesting wheelchair. Will place referral to neuro-rehabilitation for wheelchair eval.

## 2014-06-23 NOTE — Assessment & Plan Note (Signed)
Most consistent with GAD and agoraphobia. She reports having therapy in the past which helped but now having insurance coverage issues. She wants something quicker-acting than klonopin. She certainly has medical problems contributing to anxiety, but the reverse is likely also true. - Discussed with Murriel and her mother the necessity of therapy, which they both fully agree with. - Will look into therapy options and discuss with Dr Gwenlyn Saran. She prefers someone in [facility]. - Discussed that benzos are not a good choice for Aadhya due to her dizziness, multiple medications, and h/o falls. Discussed habit-forming nature of medication and will not change to shorter-acting.  - Plan to start tapering klonopin down in future. - At f/u, will plan to schedule this as it has a long half-life and is not a good PRN medication. - At f/u, would also consider referral to psychiatry at [facility] who can interact with pt's neurologist, especially if she is unable to taper down on klonopin. - consider increasing cymbalta and discontinuing amitryptiline as it has more SE profile than cymbalta, including dry mouth and sedation. If she was amenable, would taper down and stop. With cymbalta, we can increase up to 40mg  BID. Could consider increasing daily dose vs night time dose as well if sleep is an issue.

## 2014-06-23 NOTE — Assessment & Plan Note (Signed)
Roise is on quite a few sedating medications including klonopin, keppra, lamictal, norco, gabapentin, melatonin, cymbalta, amitriptyline, and flexeril.  - We discussed that changing from klonopin to shorter-acting benzo is unsafe in her.  - Would continue an attempt to reduce sedating medications at follow-up, including scheduling benzo, tapering it down slowly, and tapering down amitriptyline and increasing cymbalta if needed (both SNRIs).  - Recent increase of gabapentin to 1200mg  TID. With fall in clinic today, plan to decrease lunchtime dose back to 900mg .  - Will need to discuss serotonin syndrome risk at follow up. - Pt requested one-time refill of flexeril which I filled. Neurologist to fill in future.  - Per request, filled norco for 3 month supply (90 tab). Discussed fall risk with this medication as well.

## 2014-06-26 ENCOUNTER — Other Ambulatory Visit: Payer: Self-pay | Admitting: *Deleted

## 2014-06-26 NOTE — Telephone Encounter (Signed)
I received a voice message on the nurse line from the pharmacy requesting the refill.  Derl Barrow, RN

## 2014-06-26 NOTE — Telephone Encounter (Signed)
[name], did the pharmacy auto-send this to [place]? I just filled this 7/23 on pt request but we had discussed that her neurologist fills subsequent refills as he is the one routinely filling it.  Thx,  [name], MD

## 2014-06-26 NOTE — Telephone Encounter (Signed)
Yes, I placed referral on the 24th. Thanks!  [name], MD

## 2014-07-05 ENCOUNTER — Telehealth: Payer: Self-pay | Admitting: *Deleted

## 2014-07-05 ENCOUNTER — Other Ambulatory Visit: Payer: Self-pay | Admitting: *Deleted

## 2014-07-05 ENCOUNTER — Telehealth: Payer: Self-pay | Admitting: Family Medicine

## 2014-07-05 NOTE — Telephone Encounter (Signed)
Spoke with Suanne Marker at Dr. Jorene Guest office in [place].  Pt is there now to have 2 teeth pulled.  She was wondering if patient needs to be treated with ABX prior to procedure.  Spoke Dr. Darene Lamer and she states that patient does not need any treatment prior.  Suanne Marker is aware of this and states that they might end up putting her on something afterwards. Jazmin Hartsell,CMA

## 2014-07-05 NOTE — Telephone Encounter (Signed)
[employer] called me with call from dental office where patient is currently due to dental emergency, and where she is requiring pulling of teeth. They are asking about if she needs abx ppx. On review, she has no h/o valvular heart disease per her multiple echocardiograms and most recent cardiology note. Will not recommend ppx abx at this time.  [name], MD

## 2014-07-06 ENCOUNTER — Other Ambulatory Visit: Payer: Self-pay | Admitting: *Deleted

## 2014-07-06 MED ORDER — DULOXETINE HCL 20 MG PO CPEP: 40.0000 mg | ORAL_CAPSULE | Freq: Two times a day (BID) | ORAL | Status: DC

## 2014-07-06 NOTE — Telephone Encounter (Signed)
This refill for flexeril is likely a pharmacy error as this was just filled at 06/22/14 visit with me after pt request for one-time refill. Her neurologist usually fills this otherwise. Will refuse.  [name], MD

## 2014-07-11 ENCOUNTER — Telehealth: Payer: Self-pay | Admitting: Family Medicine

## 2014-07-13 ENCOUNTER — Ambulatory Visit (INDEPENDENT_AMBULATORY_CARE_PROVIDER_SITE_OTHER): Payer: Medicare Other | Admitting: Family Medicine

## 2014-07-13 VITALS — BP 130/72 | HR 105 | Resp 16 | Ht 65.0 in | Wt 263.0 lb

## 2014-07-13 DIAGNOSIS — F4001 Agoraphobia with panic disorder: Secondary | ICD-10-CM

## 2014-07-13 DIAGNOSIS — F411 Generalized anxiety disorder: Secondary | ICD-10-CM

## 2014-07-13 DIAGNOSIS — N898 Other specified noninflammatory disorders of vagina: Secondary | ICD-10-CM | POA: Insufficient documentation

## 2014-07-13 MED ORDER — FLUCONAZOLE 150 MG PO TABS: 150.0000 mg | ORAL_TABLET | Freq: Once | ORAL | Status: DC

## 2014-07-13 NOTE — Progress Notes (Signed)
Patient ID: Madison [name], female   DOB: Jul 25, 1976, 38 y.o.   MRN: [id number] Subjective:   CC: F/u anxiety  HPI:   Madison [name] is here to f/u on anxiety. At our last visit, she mentioned being very interested in a therapist and thinking about a psychiatrist. Today, she and her mother reiterate wanting a therapist and she is amenable to a psychiatrist. She is also still interested in a shorter-acting benzodiazepine since the klonopin is less helpful than she feels it used to be. She still feels anxious all the time. Her mother helps her organize medications, and she had been on klonopin in the mornings for 1 week but in the last week she increased back to her prior dose of BID because she started receiving dosepacks. She still reports frequent falls. 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   Review of Systems - Per HPI. Additionally, she has a neurology appointment coming up. Additionally, reports cottage cheese-like vaginal discharge with itching after recent antibiotic at dentist office (amoxicillin).  SH: daily smoker    Objective:  Physical Exam BP 130/72  Pulse 105  Resp 16  Ht 5\' 5"  (1.651 m)  Wt 263 lb (119.296 kg)  BMI 43.77 kg/m2 GEN: NAD, seated in exam room NEURO: Uses walker to get around [facility]: Mood and affect down with occasional smiling    Assessment:     Madison [name] is a 38 y.o. female with complex PMH here for follow up of anxiety.    Plan:     Follow up anxiety Stable at a high level of anxiety. Requesting change to shorter acting benzodiazepine. INterested in psychologist and psychiatrist. - Pointed patient to [email or web address] and reviewed website with her, so she can pick a psychologist. - Referral placed to psychiatrist in [name]. Suggested pt ask Dr Jacelyn Grip  for recommendations. - Recommended scheduling klonopin with goal of very slow taper in the future. Dr Jacelyn Grip prescribes this medication. Emphasized that medication should not be taken more than prescribed and it is up to Madison [name] to make it last whole month. See AVS for more details. - Recommend tapering up cymbalta and tapering off amitriptyline. Will call pt with exact recommendations. - F/u 2 weeks.  Vaginal discharge Mentioned at end of visit, so we did not do wet prep. Itchy, thick cottage-cheese like vaginal discharge reported with no fevers, chills, or other concerns noted. Resists monastat at this time due to cost. - Diflucan rx'ed - Return if fevers, chills, no improvement, abdominal pain, or other concerns.  # Health Maintenance: Not discussed.  Follow-up: Follow up in 2 weeks for f/u of anxiety.   I have spent at least 25 minutes in room with patient, >50% of this time used in counseling.   [name], MD [place]

## 2014-07-13 NOTE — Patient Instructions (Addendum)
[email or web address] I urge you to browse this to pick a psychologist in your area.  I will refer you to a psychiatrist in [facility]. It may be helpful to ask Dr Jacelyn Grip for a particular psychiatrist.  My recommendations:  Schedule klonopin because a) it is long-acting and therefore is not an as-needed medication and b) this takes out the thinking for you, therefore taking the anxiety out of making this decision. Like any other medicine, it is scheduled, and you should not take it more than it is scheduled. You have long-standing anxiety and as you know, will likely still have anxiety after taking this medication. That  Is not a reason to take another pill. You will need to have other ways to deal with your anxiety. You will have enough for 1 month and if you run out early, it will not feel good. It is up to you to make this amount work. This medication has risk of withdrawal, so you do not want to run out early.  I will call you with my recommendations for cymbalta and amitriptyline.  We are prescribing diflucan.  Follow up with me in 2 weeks.  Best,  [name], MD

## 2014-07-13 NOTE — Assessment & Plan Note (Signed)
Stable at a high level of anxiety. Requesting change to shorter acting benzodiazepine. INterested in psychologist and psychiatrist. - Pointed patient to [email or web address] and reviewed website with her, so she can pick a psychologist. - Referral placed to psychiatrist in [facility]. Suggested pt ask Dr Jacelyn Grip for recommendations. - Recommended scheduling klonopin with goal of very slow taper in the future. Dr Jacelyn Grip prescribes this medication. Emphasized that medication should not be taken more than prescribed and it is up to [facility] to make it last whole month. See AVS for more details. - Recommend tapering up cymbalta and tapering off amitriptyline. Will call pt with exact recommendations. - F/u 2 weeks.

## 2014-07-13 NOTE — Assessment & Plan Note (Signed)
Mentioned at end of visit, so we did not do wet prep. Itchy, thick cottage-cheese like vaginal discharge reported with no fevers, chills, or other concerns noted. Resists monastat at this time due to cost. - Diflucan rx'ed - Return if fevers, chills, no improvement, abdominal pain, or other concerns.

## 2014-07-14 ENCOUNTER — Telehealth: Payer: Self-pay | Admitting: Family Medicine

## 2014-07-14 MED ORDER — AMITRIPTYLINE HCL 25 MG PO TABS: ORAL_TABLET | ORAL | Status: DC

## 2014-07-14 MED ORDER — DULOXETINE HCL 60 MG PO CPEP: ORAL_CAPSULE | ORAL | Status: DC

## 2014-07-14 NOTE — Telephone Encounter (Signed)
LM with detailed information from MD.  Madison [name]

## 2014-07-14 NOTE — Telephone Encounter (Signed)
Blue Team, Please let Ms Krizek know that my recommendation for her medication that I said I would call her about is as follows:  Cymbalta taper-up: Take cymbalta 60mg  in AM and 40mg  in PM for 1 week. Then take cymbalta 60mg  in AM and 60mg  in PM for second week and continue this dose.  Amitriptyline taper-off: Take amitriptyline 50mg  nightly for 1 week. Then take 25mg  nightly for 1 week. Then stop taking.  I sent in new prescriptions to allow her to do this taper to her [place].  If she has any trouble with these changes, she should call. If she has any chest pain, trouble breathing, or severe symptoms, she should go to the ED. However, the taper makes this unlikely.  Thanks.  [name], MD

## 2014-07-17 ENCOUNTER — Other Ambulatory Visit: Payer: Self-pay | Admitting: *Deleted

## 2014-07-18 ENCOUNTER — Telehealth: Payer: Self-pay | Admitting: *Deleted

## 2014-07-18 MED ORDER — AMITRIPTYLINE HCL 25 MG PO TABS: ORAL_TABLET | ORAL | Status: DC

## 2014-07-18 MED ORDER — FLUTICASONE PROPIONATE 50 MCG/ACT NA SUSP: 2.0000 | Freq: Every day | NASAL | Status: DC

## 2014-07-18 MED ORDER — DULOXETINE HCL 60 MG PO CPEP: ORAL_CAPSULE | ORAL | Status: DC

## 2014-07-18 NOTE — Telephone Encounter (Signed)
[facility] called needing specific directions for taping Cymbalta.  A new Rx can be sent in electronically.  Derl Barrow, RN

## 2014-07-18 NOTE — Telephone Encounter (Signed)
Resent rx for cymbalta taper-up along with rx for amitriptyline taper-off.   [name], MD

## 2014-07-21 ENCOUNTER — Other Ambulatory Visit: Payer: Self-pay | Admitting: Family Medicine

## 2014-07-25 NOTE — Telephone Encounter (Signed)
This refill for flexeril is likely a pharmacy error as this was filled by me at 06/22/14 visit after pt request for one-time refill. Her neurologist usually fills this otherwise. Will refuse.  [name], MD

## 2014-07-27 ENCOUNTER — Ambulatory Visit: Payer: Medicare Other | Admitting: Physical Medicine & Rehabilitation

## 2014-07-27 ENCOUNTER — Encounter: Payer: Medicare Other | Attending: Physical Medicine & Rehabilitation

## 2014-08-03 ENCOUNTER — Encounter: Payer: Self-pay | Admitting: Physical Medicine & Rehabilitation

## 2014-08-03 ENCOUNTER — Encounter: Payer: Medicare Other | Attending: Physical Medicine & Rehabilitation

## 2014-08-03 ENCOUNTER — Other Ambulatory Visit: Payer: Self-pay | Admitting: Neurology

## 2014-08-03 ENCOUNTER — Ambulatory Visit (HOSPITAL_BASED_OUTPATIENT_CLINIC_OR_DEPARTMENT_OTHER): Payer: Medicare Other | Admitting: Physical Medicine & Rehabilitation

## 2014-08-03 VITALS — BP 134/77 | HR 93 | Resp 14 | Ht 65.0 in | Wt 260.0 lb

## 2014-08-03 DIAGNOSIS — M545 Low back pain, unspecified: Secondary | ICD-10-CM | POA: Diagnosis present

## 2014-08-03 DIAGNOSIS — M47817 Spondylosis without myelopathy or radiculopathy, lumbosacral region: Secondary | ICD-10-CM | POA: Diagnosis not present

## 2014-08-03 DIAGNOSIS — G959 Disease of spinal cord, unspecified: Secondary | ICD-10-CM

## 2014-08-03 NOTE — Progress Notes (Signed)
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  Informed consent was obtained after describing risks and benefits of the procedure with the patient, this includes bleeding, bruising, infection, paralysis and medication side effects. The patient wishes to proceed and has given written consent. The patient was placed in a prone position. The lumbar and sacral area was marked and prepped with Betadine. A 25-gauge 1-1/2 inch needle was inserted into the skin and subcutaneous tissue at 3 sites in one ML of 1% lidocaine was injected into each site. Then a 20-gauge 15 cm radio frequency needle with a 1 cm curved active tip was inserted targeting the left S1 SAP/sacral ala junction. Bone contact was made and confirmed with lateral imaging. Sensory stimulation at 50 Hz followed by motor stimulation at 2 Hz confirm proper needle location followed by injection of one ML of the solution containing one ML of 4 mg per mL dexamethasone and 3 mL of 1% MPF lidocaine. Then the left L5 SAP/transverse process junction was targeted. Bone contact was made and confirmed with lateral imaging. Sensory stimulation at 50 Hz followed by motor stimulation at 2 Hz confirm proper needle location followed by injection of one ML of the solution containing one ML of 4 mg per mL dexamethasone and 3 mL of 1% MPF lidocaine. Then the left L4 SAP/transverse process junction was targeted. Bone contact was made and confirmed with lateral imaging. Sensory stimulation at 50 Hz followed by motor stimulation at 2 Hz confirm proper needle location followed by injection of one ML of the solution containing one ML of 4 mg per mL dexamethasone and 3 mL of 1% MPF lidocaine. Radio frequency lesion being at [facility] for 90 seconds was  performed. Needles were removed. Post procedure instructions and vital signs were performed. Patient tolerated procedure well. Followup appointment was given.  If right-sided pain becomes more intense would repeat RF next month right side L3 L4-L5

## 2014-08-03 NOTE — Patient Instructions (Signed)

## 2014-08-03 NOTE — Progress Notes (Signed)
  PROCEDURE RECORD [facility]   Name: Madison [name] DOB:04-Jun-1976 MRN: [id number]  Date:08/03/2014  Physician: Alysia Penna, MD    Nurse/CMA: Shumaker RN  Allergies:  Allergies  Allergen Reactions  . Taxol [Paclitaxel]   . Tramadol   . Ultram [Tramadol Hcl]     Consent Signed: Yes.    Is patient diabetic? No.  CBG today? Pregnant: No. LMP: No LMP recorded. Patient has had a hysterectomy. (age 40-55)  Anticoagulants: no Anti-inflammatory: no Antibiotics: no  Procedure: Left L3-4-5 Radiofrequency Neurotomy   Position: Prone Start Time:12:07  End Time:12:26 Fluoro Time: 13  RN/CMA Linnie Delgrande CMA Shumaker RN    Time 1120 1234    BP 134/77 144/90    Pulse 93 79    Respirations 14 14    O2 Sat 92 94    S/S 6 6    Pain Level 9/10 6/10     D/C home with mother, patient A & O X 3, D/C instructions reviewed, and sits independently.

## 2014-08-07 ENCOUNTER — Telehealth: Payer: Self-pay | Admitting: Family Medicine

## 2014-08-07 LAB — COMPREHENSIVE METABOLIC PANEL
ALT: 45 U/L
ANION GAP: 5 — AB (ref 7–16)
Albumin: 3.7 g/dL (ref 3.4–5.0)
Alkaline Phosphatase: 98 U/L
BUN: 13 mg/dL (ref 7–18)
Bilirubin,Total: 0.4 mg/dL (ref 0.2–1.0)
CHLORIDE: 102 mmol/L (ref 98–107)
CO2: 29 mmol/L (ref 21–32)
CREATININE: 0.94 mg/dL (ref 0.60–1.30)
Calcium, Total: 9.1 mg/dL (ref 8.5–10.1)
EGFR (African American): >60
GLUCOSE: 103 mg/dL — AB (ref 65–99)
OSMOLALITY: 272 (ref 275–301)
Potassium: 3.8 mmol/L (ref 3.5–5.1)
SGOT(AST): 42 U/L — ABNORMAL HIGH (ref 15–37)
SODIUM: 136 mmol/L (ref 136–145)
TOTAL PROTEIN: 7.4 g/dL (ref 6.4–8.2)

## 2014-08-07 LAB — CBC
HCT: 43.2 % (ref 35.0–47.0)
HGB: 13.7 g/dL (ref 12.0–16.0)
MCH: 26.9 pg (ref 26.0–34.0)
MCHC: 31.8 g/dL — AB (ref 32.0–36.0)
MCV: 85 fL (ref 80–100)
Platelet: 327 10*3/uL (ref 150–440)
RBC: 5.11 10*6/uL (ref 3.80–5.20)
RDW: 16.3 % — AB (ref 11.5–14.5)
WBC: 10 10*3/uL (ref 3.6–11.0)

## 2014-08-07 LAB — MAGNESIUM: Magnesium: 2 mg/dL

## 2014-08-07 LAB — PROTIME-INR
INR: 1
Prothrombin Time: 12.7 secs (ref 11.5–14.7)

## 2014-08-07 LAB — LIPASE, BLOOD: Lipase: 116 U/L (ref 73–393)

## 2014-08-07 LAB — HCG, QUANTITATIVE, PREGNANCY: Beta Hcg, Quant.: 1 m[IU]/mL

## 2014-08-07 LAB — TROPONIN I

## 2014-08-07 NOTE — Telephone Encounter (Signed)
Patient called reporting severe pain and inability to ambulate.  This started after her recent radio frequency ablation performed by Dr. Ella Bodo. I advised her to go to the ED for evaluation.  She is in agreement.

## 2014-08-08 ENCOUNTER — Other Ambulatory Visit: Payer: Self-pay | Admitting: Oncology

## 2014-08-08 ENCOUNTER — Observation Stay: Payer: Self-pay | Admitting: Internal Medicine

## 2014-08-08 ENCOUNTER — Telehealth: Payer: Self-pay | Admitting: Oncology

## 2014-08-08 DIAGNOSIS — C50919 Malignant neoplasm of unspecified site of unspecified female breast: Secondary | ICD-10-CM

## 2014-08-08 LAB — CBC WITH DIFFERENTIAL/PLATELET
BASOS ABS: 0.1 10*3/uL (ref 0.0–0.1)
BASOS PCT: 1 %
Eosinophil #: 0.4 10*3/uL (ref 0.0–0.7)
Eosinophil %: 4.4 %
HCT: 41.6 % (ref 35.0–47.0)
HGB: 13.2 g/dL (ref 12.0–16.0)
LYMPHS ABS: 2.9 10*3/uL (ref 1.0–3.6)
LYMPHS PCT: 32.2 %
MCH: 26.8 pg (ref 26.0–34.0)
MCHC: 31.6 g/dL — ABNORMAL LOW (ref 32.0–36.0)
MCV: 85 fL (ref 80–100)
MONO ABS: 0.9 x10 3/mm (ref 0.2–0.9)
Monocyte %: 9.8 %
Neutrophil #: 4.8 10*3/uL (ref 1.4–6.5)
Neutrophil %: 52.6 %
PLATELETS: 290 10*3/uL (ref 150–440)
RBC: 4.9 10*6/uL (ref 3.80–5.20)
RDW: 16.3 % — AB (ref 11.5–14.5)
WBC: 9.1 10*3/uL (ref 3.6–11.0)

## 2014-08-08 LAB — BASIC METABOLIC PANEL
ANION GAP: 6 — AB (ref 7–16)
BUN: 11 mg/dL (ref 7–18)
CREATININE: 0.85 mg/dL (ref 0.60–1.30)
Calcium, Total: 8.7 mg/dL (ref 8.5–10.1)
Chloride: 100 mmol/L (ref 98–107)
Co2: 34 mmol/L — ABNORMAL HIGH (ref 21–32)
EGFR (African American): >60
GLUCOSE: 143 mg/dL — AB (ref 65–99)
OSMOLALITY: 281 (ref 275–301)
POTASSIUM: 3.4 mmol/L — AB (ref 3.5–5.1)
SODIUM: 140 mmol/L (ref 136–145)

## 2014-08-08 NOTE — Progress Notes (Unsigned)
I was called today by Dr. Posey Pronto from [facility] telling me the call was admitted there for a fall following his seizure event. They obtained a CT of her neck and found to lytic lesions.  I find that Madison [name] already has an MRI of the cervical spine scheduled for September 13. She will need to be fully restaged, and I am adding a PET scan before she sees me again September 23.

## 2014-08-08 NOTE — Telephone Encounter (Signed)
PT CALLED BACK AND ASKED THAT I CALL HER MOM Madison [name] RE APPTS. PER PT SHE IS  A LITTLE BIT CONFUSED AND WROTE THE APPOINTMENTS DOWN ILLEGIBLY. CALLED MOM WITH APPTS FOR 9/21 LB/FLUSH AND 9/23 GM. S/W MOM RE PET SCAN DATE ORDERED FOR 9/21. PER MOM SHE WOULD LIKE TO LEAVE PET AS SCHEDULE. PER MOM WHEN RAD CALLED THEY ASKED WHEN THEY WANTED PET AND THEY WANTED IT ASAP.

## 2014-08-08 NOTE — Telephone Encounter (Signed)
PT CALLED TODAY RE CALL FROM GM THAT SHE NEEDED PET/FU. PER 08/08/14 POF PT GIVEN LAB FOR 9/21 AND F/U W/GM 9/23. PT ALSO ALREADY ON SCHEDULE FOR PET 9/11. PER 9/8 POF AND PET ORDER EXPECTED DATE FOR PET IS 9/21. PER PT SHE WILL CALL [facility] TO HAVE THEM R/S PET. ALSO 9/22 FLUSH APPT MOVED TO 9/21 AFTER LAB. PT AWARE AND HAS APPTS FOR 9/21 AND 9/23.

## 2014-08-09 ENCOUNTER — Telehealth: Payer: Self-pay | Admitting: Oncology

## 2014-08-09 NOTE — Telephone Encounter (Signed)
cld & spoke w/pt to adv GM moved appt to 9/24 @ 12. Pt understood tim,e & date and req to spk to nurse-trans to [id number]

## 2014-08-10 ENCOUNTER — Encounter: Payer: Self-pay | Admitting: Physical Medicine & Rehabilitation

## 2014-08-11 ENCOUNTER — Encounter: Payer: Self-pay | Admitting: Family Medicine

## 2014-08-11 ENCOUNTER — Encounter (HOSPITAL_COMMUNITY): Payer: Self-pay

## 2014-08-11 ENCOUNTER — Ambulatory Visit (INDEPENDENT_AMBULATORY_CARE_PROVIDER_SITE_OTHER): Payer: Medicare Other | Admitting: Family Medicine

## 2014-08-11 ENCOUNTER — Ambulatory Visit (HOSPITAL_COMMUNITY)
Admission: RE | Admit: 2014-08-11 | Discharge: 2014-08-11 | Disposition: A | Discharge status: Home / Self Care | Payer: Medicare Other | Source: Ambulatory Visit | Attending: Oncology | Admitting: Oncology

## 2014-08-11 VITALS — BP 136/70 | HR 93 | Temp 98.3°F | Ht 65.0 in | Wt 259.4 lb

## 2014-08-11 DIAGNOSIS — F411 Generalized anxiety disorder: Secondary | ICD-10-CM

## 2014-08-11 DIAGNOSIS — Z789 Other specified health status: Secondary | ICD-10-CM

## 2014-08-11 DIAGNOSIS — C50919 Malignant neoplasm of unspecified site of unspecified female breast: Secondary | ICD-10-CM | POA: Insufficient documentation

## 2014-08-11 LAB — GLUCOSE, CAPILLARY: Glucose-Capillary: 102 mg/dL — ABNORMAL HIGH (ref 70–99)

## 2014-08-11 MED ORDER — OXYCODONE-ACETAMINOPHEN 10-325 MG PO TABS: 1.0000 | ORAL_TABLET | Freq: Three times a day (TID) | ORAL | Status: DC | PRN

## 2014-08-11 MED ORDER — FLUDEOXYGLUCOSE F - 18 (FDG) INJECTION
13.1000 | Freq: Once | INTRAVENOUS | Status: AC | PRN
  Administered 2014-08-11: 13.1 via INTRAVENOUS

## 2014-08-11 NOTE — Patient Instructions (Signed)
I'm sorry you are going through this. I commend your strength. That being said, therapy can be very helpful to support you as you face this big news. Please also let me know how I can continue to help.  Continue your cymbalta and discuss scheduling the klonopin with Dr Jacelyn Grip and slow tapering it down. As you go through this information, it may not be the best time to plan to taper off but we can rediscuss this in the future.  We are increasing your pain medication temporarily to percocet. Take 1/2 tablet and increase to 1 tablet if needed. Be very wary that this can cause drowsiness and increase the risk for falls.  Follow up with me after you see your oncologist.  Best,  [name], MD

## 2014-08-11 NOTE — Progress Notes (Signed)
Patient ID: Madison [name], female   DOB: 04-10-1976, 38 y.o.   MRN: [id number] Subjective:   CC: F/u anxiety, possible cancer metastasis  HPI:   Possible cancer metastasis Madison [name] is a 38 y.o. female with h/o breast cancer here after fall and trip to [facility] ED 08/08/14 where she had spinal CT scan showing lytic C2 and T2 osseous lesions highly suspicious for osseous metastatic disease in the setting of breast cancer (per records she brings from [facility], scanned into chart). CBC, CMET, and head CT and CXR WNL. She is obviously shaken by this news and has made up her mind that she does not want any treatment if this is recurrent cancer. She has spoken with her oncologist and has previously set her mother up as her HCPOA Madison [name], [phone], [place], [place] [address]). They have scanned this into epic in the past. She also has DNR drawn up. Severe pain in left lower back and leg, requesting stronger medication than vicodin. Reports having 6 vicodin 5-325mg  tabs left at home, was taking 2 at a time. Scheduled for PET scan today.  Anxiety Mildly worsened since last visit due to possible cancer mets. Taking cymbalta 60mg  BID with no side effects from taper up. Has tapered off amitriptyline. Neurologist has not changed her klonopin yet as Madison [name] has not talked to him yet about my recommendation to schedule and slowly taper down. She has not yet contacted therapist. She has been contacted by psychiatrist in [place]. She is now hesitant to do either since she is feeling withdrawn after imaging showed possible cancer mets in spine.  Review of Systems - Per HPI.   PMH: Meds reviewed. - Stopped norvasc and amitriptyline. - INcreased keppra to 2500mg  BID and plans to increase to 3000mg  BID next week (due to increased seizures lately). - Lyrica75mg  BID x 2 weeks, then plans to increase to 150mg  BID. Plans decrease in sept. - Omeprazole 40mg  daily working for now     Objective:  Physical Exam BP 136/70  Pulse 93  Temp(Src) 98.3 F (36.8 C) (Oral)  Ht 5\' 5"  (1.651 m)  Wt 259 lb 6.4 oz (117.663 kg)  BMI 43.17 kg/m2 GEN: NAD SKIN: Long scratches down right and left arms and right cheek. All intact, right with mild purulence that is dried, minimal erythema surrounding healing scratch, no induration or warmth. PSYCH: Mood and affect mildly withdrawn and flat     Assessment:     Madison [name] is a 38 y.o. female with h/o breast cancer and anxiety here for f/u of anxiety and discussion of bony lesions on imaging at outside hospital.    Plan:     Possible cancer metastasis with CT scan with bony lytic leisons in C2 and T2.  Pt with severe back pain extending down left leg and fall, imaging done 08/08/14 at [facility] showing the above (scanned into records). - Rx for percocet 10-325mg  for 1 tab TID prn which is how she has been using the viodin, #90. Urged her to use 1/2 tab when possible. Warned re: risk for falls. Has family with her 24 hours. - F/u with onc scheduled end of this month 9/24. - F/u with me after this. - Skin healing well after scratches from fall. Recommended keeping wrapped in gauze only when using walker and arm rubbing against body to prevent continued irritation esp to right arm.  Anxiety Chronically poorly-controlled, pt reports mildly worsened with recent news of possible return of  her breast cancer as spinal mets. Transitioned off amitriptyline to cymbalta 60mg  BID. - Continue ccymbalta 60mg  BID. - Continued to recommend therapist and psychiatrist. Resistant to both at this time. - F/u with Dr Jacelyn Grip re: klonopin slow taper as he is prescribing, though with possible cancer mets may not be best time to start taper. - F/u after 9/24 visit with oncologist.  Follow-up: Follow up in 3 weeks for f/u of anxiety and bony lesions.   [name], MD [place]

## 2014-08-13 ENCOUNTER — Ambulatory Visit
Admission: RE | Admit: 2014-08-13 | Discharge: 2014-08-13 | Disposition: A | Discharge status: Home / Self Care | Payer: Medicare Other | Source: Ambulatory Visit | Attending: Neurology | Admitting: Neurology

## 2014-08-13 ENCOUNTER — Other Ambulatory Visit: Payer: Self-pay | Admitting: Oncology

## 2014-08-13 ENCOUNTER — Ambulatory Visit
Admission: RE | Admit: 2014-08-13 | Discharge: 2014-08-13 | Disposition: A | Discharge status: Home / Self Care | Source: Ambulatory Visit | Attending: Neurology | Admitting: Neurology

## 2014-08-13 DIAGNOSIS — G959 Disease of spinal cord, unspecified: Secondary | ICD-10-CM

## 2014-08-13 DIAGNOSIS — Z789 Other specified health status: Secondary | ICD-10-CM | POA: Insufficient documentation

## 2014-08-13 MED ORDER — GADOBENATE DIMEGLUMINE 529 MG/ML IV SOLN
20.0000 mL | Freq: Once | INTRAVENOUS | Status: AC | PRN
  Administered 2014-08-13: 20 mL via INTRAVENOUS

## 2014-08-13 NOTE — Assessment & Plan Note (Signed)
Chronically poorly-controlled, pt reports mildly worsened with recent news of possible return of her breast cancer as spinal mets. Transitioned off amitriptyline to cymbalta 60mg  BID. - Continue ccymbalta 60mg  BID. - Continued to recommend therapist and psychiatrist. Resistant to both at this time. - F/u with Dr Jacelyn Grip re: klonopin slow taper as he is prescribing, though with possible cancer mets may not be best time to start taper. - F/u after 9/24 visit with oncologist.

## 2014-08-13 NOTE — Assessment & Plan Note (Signed)
Pt with severe back pain extending down left leg and fall, imaging done 08/08/14 at [facility] showing the above (scanned into records). - Rx for percocet 10-325mg  for 1 tab TID prn which is how she has been using the viodin, #90. Urged her to use 1/2 tab when possible. Warned re: risk for falls. Has family with her 24 hours. - F/u with onc scheduled end of this month 9/24. - F/u with me after this. - Skin healing well after scratches from fall. Recommended keeping wrapped in gauze only when using walker and arm rubbing against body to prevent continued irritation esp to right arm.

## 2014-08-14 ENCOUNTER — Telehealth: Payer: Self-pay | Admitting: Oncology

## 2014-08-14 NOTE — Telephone Encounter (Signed)
pt cld to chge lab appt time-*stated just had a flush @ [facility]-just wanted labs only-r/s & gave pt time & date

## 2014-08-15 ENCOUNTER — Other Ambulatory Visit: Payer: Self-pay | Admitting: Oncology

## 2014-08-15 ENCOUNTER — Telehealth: Payer: Self-pay | Admitting: Family Medicine

## 2014-08-15 NOTE — Telephone Encounter (Signed)
She is requresting a prescription for a wheelchair PHq9 description screen: 8

## 2014-08-16 ENCOUNTER — Telehealth: Payer: Self-pay | Admitting: Emergency Medicine

## 2014-08-16 ENCOUNTER — Other Ambulatory Visit: Payer: Self-pay | Admitting: Oncology

## 2014-08-16 DIAGNOSIS — C50919 Malignant neoplasm of unspecified site of unspecified female breast: Secondary | ICD-10-CM

## 2014-08-16 NOTE — Telephone Encounter (Signed)
Sent a message to [employer] with [facility].  She emailed "angie n" regarding this according to notes.  Will wait to hear from her.  I don't see that anything was scheduled. Aime Meloche,CMA

## 2014-08-16 NOTE — Telephone Encounter (Signed)
Left message on patient's voicemail with dates and times of upcoming lab and MD appointments for Sept.

## 2014-08-16 NOTE — Telephone Encounter (Signed)
On 06/23/14 I had placed a referral for neurorehab for evaluation for wheelchair when patient asked for a wheelchair at that time. Did anything come of this? Thanks,  [name], MD

## 2014-08-16 NOTE — Progress Notes (Unsigned)
I called me call with the results of her PET scan. This shows several areas of pleural thickening, some left hilar lymphadenopathy, scattered bilateral pulmonary nodules, a hepatoduodenal ligament lymph node and multiple bony lesions.  She had initially thought that she wanted no treatment. We discussed the average survival of stage IV disease, and the fact that untreated disease really result in much worse quality of life as well as also length of life.  We do need biopsy confirmation of this. She is agreeing to a bone marrow biopsy and I am setting that up through interventional radiology it was long as soon as possible.  She has had lab work recently at [facility]. She will bring those results. She would prefer not to get "stuck" again on September 21 and I am canceling that lab work. She will see me again on September 24. Hopefully at that time we will have results of her biopsy and can consider switching to fulvestrant and Palbociclib and discontinuing the anastrozole. We can start to fulvestrant on the same day if she is agreeable.

## 2014-08-17 ENCOUNTER — Telehealth: Payer: Self-pay | Admitting: Oncology

## 2014-08-17 ENCOUNTER — Other Ambulatory Visit: Payer: Self-pay | Admitting: Emergency Medicine

## 2014-08-17 NOTE — Telephone Encounter (Signed)
Fwding to blue team. Please call and let patient know status of referral.  [name], MD

## 2014-08-17 NOTE — Telephone Encounter (Signed)
Pt returning Jazmin's phone call. Pls call back

## 2014-08-17 NOTE — Telephone Encounter (Signed)
Spoke with Angie at [facility] and appt was made for 09/19/14.  Pt is aware of this.  [facility] will be present at appt to take care of ordering wheelchair for patient. Launi Asencio,CMA

## 2014-08-17 NOTE — Telephone Encounter (Signed)
Pt calls again, checking status of RX for wheelchair. Pls call patient.

## 2014-08-17 NOTE — Telephone Encounter (Signed)
per pof to sch bone marrow biopsy-per [facility] in process of sch and they will contact pt

## 2014-08-18 ENCOUNTER — Other Ambulatory Visit: Payer: Self-pay | Admitting: Radiology

## 2014-08-18 ENCOUNTER — Other Ambulatory Visit: Payer: Self-pay | Admitting: Emergency Medicine

## 2014-08-18 ENCOUNTER — Encounter (HOSPITAL_COMMUNITY): Payer: Self-pay | Admitting: Pharmacy Technician

## 2014-08-21 ENCOUNTER — Other Ambulatory Visit: Payer: Self-pay | Admitting: *Deleted

## 2014-08-21 ENCOUNTER — Other Ambulatory Visit: Payer: Self-pay

## 2014-08-21 DIAGNOSIS — I159 Secondary hypertension, unspecified: Secondary | ICD-10-CM

## 2014-08-21 MED ORDER — CAPTOPRIL-HYDROCHLOROTHIAZIDE 25-25 MG PO TABS: 1.0000 | ORAL_TABLET | Freq: Two times a day (BID) | ORAL | Status: AC

## 2014-08-22 ENCOUNTER — Ambulatory Visit (HOSPITAL_COMMUNITY)
Admission: RE | Admit: 2014-08-22 | Discharge: 2014-08-22 | Disposition: A | Discharge status: Home / Self Care | Payer: Medicare Other | Source: Ambulatory Visit | Attending: Oncology | Admitting: Oncology

## 2014-08-22 ENCOUNTER — Encounter (HOSPITAL_COMMUNITY): Payer: Self-pay

## 2014-08-22 DIAGNOSIS — M949 Disorder of cartilage, unspecified: Secondary | ICD-10-CM

## 2014-08-22 DIAGNOSIS — C50919 Malignant neoplasm of unspecified site of unspecified female breast: Secondary | ICD-10-CM | POA: Insufficient documentation

## 2014-08-22 DIAGNOSIS — R000 Tachycardia, unspecified: Secondary | ICD-10-CM | POA: Insufficient documentation

## 2014-08-22 DIAGNOSIS — M899 Disorder of bone, unspecified: Secondary | ICD-10-CM | POA: Diagnosis not present

## 2014-08-22 LAB — CBC
HCT: 41.6 % (ref 36.0–46.0)
Hemoglobin: 13.2 g/dL (ref 12.0–15.0)
MCH: 27.2 pg (ref 26.0–34.0)
MCHC: 31.7 g/dL (ref 30.0–36.0)
MCV: 85.8 fL (ref 78.0–100.0)
PLATELETS: 311 10*3/uL (ref 150–400)
RBC: 4.85 MIL/uL (ref 3.87–5.11)
RDW: 15.2 % (ref 11.5–15.5)
WBC: 9.6 10*3/uL (ref 4.0–10.5)

## 2014-08-22 LAB — PROTIME-INR
INR: 0.99 (ref 0.00–1.49)
PROTHROMBIN TIME: 13.1 s (ref 11.6–15.2)

## 2014-08-22 LAB — APTT: aPTT: 31 seconds (ref 24–37)

## 2014-08-22 MED ORDER — FENTANYL CITRATE 0.05 MG/ML IJ SOLN
INTRAMUSCULAR | Status: AC
  Filled 2014-08-22: qty 4

## 2014-08-22 MED ORDER — MIDAZOLAM HCL 2 MG/2ML IJ SOLN
INTRAMUSCULAR | Status: AC | PRN
  Administered 2014-08-22 (×2): 1 mg via INTRAVENOUS

## 2014-08-22 MED ORDER — SODIUM CHLORIDE 0.9 % IV SOLN
Freq: Once | INTRAVENOUS | Status: AC
  Administered 2014-08-22: 10:00:00 via INTRAVENOUS

## 2014-08-22 MED ORDER — HEPARIN SOD (PORK) LOCK FLUSH 100 UNIT/ML IV SOLN
500.0000 [IU] | Freq: Once | INTRAVENOUS | Status: AC
  Administered 2014-08-22: 500 [IU] via INTRAVENOUS
  Filled 2014-08-22 (×2): qty 5

## 2014-08-22 MED ORDER — MIDAZOLAM HCL 2 MG/2ML IJ SOLN
INTRAMUSCULAR | Status: AC
  Filled 2014-08-22: qty 4

## 2014-08-22 MED ORDER — FENTANYL CITRATE 0.05 MG/ML IJ SOLN
INTRAMUSCULAR | Status: AC | PRN
  Administered 2014-08-22: 100 ug via INTRAVENOUS

## 2014-08-22 NOTE — Discharge Instructions (Signed)
Bone Marrow Aspiration, Bone Marrow Biopsy °Care After °Read the instructions outlined below and refer to this sheet in the next few weeks. These discharge instructions provide you with general information on caring for yourself after you leave the hospital. Your caregiver may also give you specific instructions. While your treatment has been planned according to the most current medical practices available, unavoidable complications occasionally occur. If you have any problems or questions after discharge, call your caregiver. °FINDING OUT THE RESULTS OF YOUR TEST °Not all test results are available during your visit. If your test results are not back during the visit, make an appointment with your caregiver to find out the results. Do not assume everything is normal if you have not heard from your caregiver or the medical facility. It is important for you to follow up on all of your test results.  °HOME CARE INSTRUCTIONS  °You have had sedation and may be sleepy or dizzy. Your thinking may not be as clear as usual. For the next 24 hours: °· Only take over-the-counter or prescription medicines for pain, discomfort, and or fever as directed by your caregiver. °· Do not drink alcohol. °· Do not smoke. °· Do not drive. °· Do not make important legal decisions. °· Do not operate heavy machinery. °· Do not care for small children by yourself. °· Keep your dressing clean and dry. You may replace dressing with a bandage after 24 hours. °· You may take a bath or shower after 24 hours. °· Use an ice pack for 20 minutes every 2 hours while awake for pain as needed. °SEEK MEDICAL CARE IF:  °· There is redness, swelling, or increasing pain at the biopsy site. °· There is pus coming from the biopsy site. °· There is drainage from a biopsy site lasting longer than one day. °· An unexplained oral temperature above 102° F (38.9° C) develops. °SEEK IMMEDIATE MEDICAL CARE IF:  °· You develop a rash. °· You have difficulty  breathing. °· You develop any reaction or side effects to medications given. °Document Released: 06/06/2005 Document Revised: 02/09/2012 Document Reviewed: 11/14/2008 °ExitCare® Patient Information ©2015 [employer]. This information is not intended to replace advice given to you by your health care provider. Make sure you discuss any questions you have with your health care provider. °Conscious Sedation, Adult, Care After °Refer to this sheet in the next few weeks. These instructions provide you with information on caring for yourself after your procedure. Your health care provider may also give you more specific instructions. Your treatment has been planned according to current medical practices, but problems sometimes occur. Call your health care provider if you have any problems or questions after your procedure. °WHAT TO EXPECT AFTER THE PROCEDURE  °After your procedure: °· You may feel sleepy, clumsy, and have poor balance for several hours. °· Vomiting may occur if you eat too soon after the procedure. °HOME CARE INSTRUCTIONS °· Do not participate in any activities where you could become injured for at least 24 hours. Do not: °¨ Drive. °¨ Swim. °¨ Ride a bicycle. °¨ Operate heavy machinery. °¨ Cook. °¨ Use power tools. °¨ Climb ladders. °¨ Work from a high place. °· Do not make important decisions or sign legal documents until you are improved. °· If you vomit, drink water, juice, or soup when you can drink without vomiting. Make sure you have little or no nausea before eating solid foods. °· Only take over-the-counter or prescription medicines for pain, discomfort, or fever   as directed by your health care provider.  Make sure you and your family fully understand everything about the medicines given to you, including what side effects may occur.  You should not drink alcohol, take sleeping pills, or take medicines that cause drowsiness for at least 24 hours.  If you smoke, do not smoke without  supervision.  If you are feeling better, you may resume normal activities 24 hours after you were sedated.  Keep all appointments with your health care provider. SEEK MEDICAL CARE IF:  Your skin is pale or bluish in color.  You continue to feel nauseous or vomit.  Your pain is getting worse and is not helped by medicine.  You have bleeding or swelling.  You are still sleepy or feeling clumsy after 24 hours. SEEK IMMEDIATE MEDICAL CARE IF:  You develop a rash.  You have difficulty breathing.  You develop any type of allergic problem.  You have a fever. MAKE SURE YOU:  Understand these instructions.  Will watch your condition.  Will get help right away if you are not doing well or get worse. Document Released: 09/07/2013 Document Reviewed: 09/07/2013 [facility] Patient Information 2015 [place], [place]. This information is not intended to replace advice given to you by your health care provider. Make sure you discuss any questions you have with your health care provider.

## 2014-08-22 NOTE — Procedures (Signed)
Interventional Radiology Procedure Note History: Previous breast CT.  Question of new mets Procedure: CT guided aspirate and core biopsy of right iliac bone lesion. Lesion FDG+ on PET.  Complications: None Recommendations: - Bedrest supine x 1hour - Follow biopsy results  Signed,  Dulcy Fanny. Earleen Newport, DO

## 2014-08-22 NOTE — H&P (Signed)
Chief Complaint: "I'm having a biopsy"   Referring Physician(s): Magrinat,Gustav C  History of Present Illness: Madison [name] is a 38 y.o. female with history of breast carcinoma and recent PET scan revealing hypermetabolic lymphadenopathy as well as bony lytic lesions. She presents today for CT guided biopsy of a right iliac crest lytic lesion.  Past Medical History  Diagnosis Date  . Tobacco user   . Grand mal seizure   . Fibromyalgia   . Seizures   . Breast CA   . GERD (gastroesophageal reflux disease)   . Depression   . Arthritis   . Allergy   . Hypertension     Past Surgical History  Procedure Laterality Date  . Appendectomy    . Tonsillectomy    . Tubal ligation    . Mastectomy      bilateral  . Cholecystectomy  March 2011  . Abdominal hysterectomy  March 2012  . Breast surgery      bilateral mastectomy  . Port-a-cath insertion      Allergies: Taxol; Adhesive; and Ultram  Medications: Prior to Admission medications   Medication Sig Start Date End Date Taking? Authorizing Provider  Alum & Mag Hydroxide-Simeth (MAGIC MOUTHWASH) SOLN Take 5 mLs by mouth 3 (three) times daily as needed for mouth pain. 05/08/14   [name], MD  anastrozole (ARIMIDEX) 1 MG tablet Take 1 mg by mouth every morning.    Historical Provider, MD  antiseptic oral rinse (BIOTENE) LIQD 15 mLs by Mouth Rinse route as needed for dry mouth.     Historical Provider, MD  captopril-hydrochlorothiazide (CAPOZIDE) 25-25 MG per tablet Take 1 tablet by mouth 2 (two) times daily. 08/21/14   [name], MD  carvedilol (COREG) 6.25 MG tablet Take 1 tablet (6.25 mg total) by mouth 2 (two) times daily. 05/04/14   Wellington Hampshire, MD  clonazePAM (KLONOPIN) 0.5 MG tablet Take 0.5-1 mg by mouth 2 (two) times daily. 06/28/12   Clearnce Sorrel, MD  clonazePAM (KLONOPIN) 1 MG disintegrating tablet Take 2 tablets (2 mg total) by mouth 2 (two) times daily as needed. 06/28/12 09/03/13  Clearnce Sorrel, MD  cyclobenzaprine (FLEXERIL) 10 MG tablet Take 10 mg by mouth at bedtime.    Historical Provider, MD  Diazepam (DIASTAT ACUDIAL) 20 MG GEL Place 20 mg rectally as needed (for seizures longer than 5 minutes or seizures clusters.). 03/29/12   Clearnce Sorrel, MD  DULoxetine (CYMBALTA) 60 MG capsule Take 60 mg by mouth 2 (two) times daily.    Historical Provider, MD  fluticasone (FLONASE) 50 MCG/ACT nasal spray Place 2 sprays into both nostrils daily as needed for allergies or rhinitis.    Historical Provider, MD  gabapentin (NEURONTIN) 300 MG capsule Take 1,200 mg by mouth 3 (three) times daily.  03/31/14   Historical Provider, MD  LamoTRIgine 300 MG TB24 Take 1 tablet by mouth 2 (two) times daily.    Historical Provider, MD  levETIRAcetam (KEPPRA) 1000 MG tablet Take 3,000 mg by mouth 2 (two) times daily.    Historical Provider, MD  lidocaine (XYLOCAINE) 2 % solution Take 20 mLs by mouth as needed. Pt states she uses after a seizure. 01/15/12   Historical Provider, MD  LYRICA 150 MG capsule Take 150 mg by mouth 2 (two) times daily.  07/06/14   Historical Provider, MD  Melatonin 10 MG TABS Take 1 tablet by mouth at bedtime.    Historical Provider, MD  omeprazole (PRILOSEC) 40 MG capsule Take 40 mg by mouth every morning.    Historical Provider, MD  ondansetron (ZOFRAN) 4 MG tablet Take 4 mg by mouth every 8 (eight) hours as needed for nausea or vomiting.      Historical Provider, MD  oxyCODONE-acetaminophen (PERCOCET) 10-325 MG per tablet Take 0.5 tablets by mouth every 8 (eight) hours  as needed for pain.    Historical Provider, MD  ranitidine (ZANTAC) 150 MG tablet Take 150 mg by mouth at bedtime.     Historical Provider, MD    Family History  Problem Relation Age of Onset  . Diabetes Sister   . Hypertension Maternal Grandfather     History   Social History  . Marital Status: Single    Spouse Name: N/A    Number of Children: N/A  . Years of Education: N/A   Social History Main Topics  . Smoking status: Current Every Day Smoker -- 1.00 packs/day for 25 years    Types: Cigarettes  . Smokeless tobacco: Never Used     Comment: 1 ppd for 20 years   . Alcohol Use: No  . Drug Use: No  . Sexual Activity: Not Currently    Birth Control/ Protection: Surgical   Other Topics Concern  . Not on file   Social History Narrative   Living w parents in [place] due to cancer and inability to take car for herself or others. Has 3 children. 49 yo has bipolar and MR, currently in foster home. Other 2 live with her. Divorced.          Review of Systems  Constitutional: Negative for fever and chills.  Respiratory: Negative for cough and shortness of breath.   Cardiovascular: Negative for chest pain.  Gastrointestinal: Negative for nausea, vomiting, abdominal pain and blood in stool.  Genitourinary: Negative for dysuria and hematuria.  Musculoskeletal: Positive for back pain and neck pain.  Neurological: Positive for headaches.       Hx seizures (on meds)  Hematological: Does not bruise/bleed easily.    Vital Signs: BP 131/72  Pulse 94  Temp(Src) 98.2 F (36.8 C) (Oral)  Resp 16  SpO2 93%  Physical Exam  Constitutional: She is oriented to person, place, and time. She appears well-developed and well-nourished.  Cardiovascular: Normal rate and regular rhythm.   Pulmonary/Chest: Effort normal and breath sounds normal.  Abdominal: Soft.  Bowel sounds are normal. There is no tenderness.  obese  Musculoskeletal: Normal range of motion.  Trace edema  Neurological: She is alert and oriented to person, place, and time.    Imaging: Mr Cervical Spine W Wo Contrast  08/13/2014   CLINICAL DATA:  Neck pain. Breast cancer. Abnormal CT cervical spine  EXAM: MRI CERVICAL SPINE WITHOUT AND WITH CONTRAST  TECHNIQUE: Multiplanar and multiecho pulse sequences of the cervical spine, to include the craniocervical junction and cervicothoracic junction, were obtained according to standard protocol without and with intravenous contrast.  CONTRAST:  15mL MULTIHANCE GADOBENATE DIMEGLUMINE 529 MG/ML IV SOLN  COMPARISON:  CT 08/07/2014  FINDINGS: Image quality degraded by motion.  Abnormality in the bone at C2 posteriorly and on the left compatible with metastatic disease as noted on CT. No tumor extension into the canal. No cord compression.  Bone marrow tumor at T2 present. This is a lytic lesion on CT. No tumor in the canal.  Negative for fracture.  Normal alignment with mild kyphosis  C2-3:  Negative disc space  C3-4:  Mild spondylosis without spinal stenosis  C4-5:  Negative  C5-6: Disc degeneration and spondylosis with central osteophyte and mild spinal stenosis. Mild foraminal stenosis bilaterally  C6-7: Left foraminal narrowing due to spurring  C7-T1:  Negative  IMPRESSION: Metastatic disease posterior elements at C2 involving the spinous processes and left-sided lamina and facet. Review of the CT reveals a pathologic fracture through the spinous process of  C2.  Metastatic disease T2 vertebral body without fracture.  Cervical spondylosis most notably at C5-6.   Electronically Signed   By: Franchot Gallo M.D.   On: 08/13/2014 18:05   Mr Thoracic Spine W Wo Contrast  08/14/2014   CLINICAL DATA:  Neck and back pain.  Frequent falls.  Breast cancer.  EXAM: MRI THORACIC SPINE WITHOUT AND WITH CONTRAST  TECHNIQUE: Multiplanar and multiecho pulse sequences of  the thoracic spine were obtained without and with intravenous contrast.  CONTRAST:  20 mL MultiHance  COMPARISON:  PET-CT 08/11/2014  FINDINGS: Images are mildly degraded by motion artifact.  Vertebral alignment is normal. Vertebral body heights are preserved without compression fracture. T2 hyperintense, enhancing lesions measure 1.3 cm in the T2 vertebral body, 2.3 cm in the T8 vertebral body, and 2.1 cm in the T9 vertebral body. The T8 lesion appears to disrupt the right lateral vertebral body cortex with slight extension into the right-sided paravertebral soft tissues. There also appears to be some disruption of the right lateral T9 vertebral body cortex by the lesion in that vertebral body. No epidural tumor is identified.  Small central disc protrusion at T2-3 and small right central disc protrusion at T6-7 do not result in spinal canal stenosis. No neural foraminal stenosis is identified. Thoracic spinal cord is normal in caliber and signal allowing for motion artifact. Incidental note is made of an azygos fissure.  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 No epidural tumor identified.   Electronically Signed   By: Logan Bores   On: 08/14/2014 08:10   Nm Pet Image Restag (ps) Skull Base To Thigh  08/11/2014   CLINICAL DATA:  Subsequent treatment strategy for breast cancer.  EXAM: NUCLEAR MEDICINE PET SKULL BASE TO THIGH  TECHNIQUE: 13.1 mCi F-18 FDG was injected intravenously. Full-ring PET imaging was performed from the skull base to thigh after the radiotracer. CT data was obtained and used for attenuation correction and anatomic localization.  FASTING BLOOD GLUCOSE:  Value: 102 mg/dl  COMPARISON:  Cervical spine CT from 08/07/2014. Abdomen and pelvis CT from 06/11/2009. Chest CT from 04/20/2009.  FINDINGS: NECK  No hypermetabolic lymph nodes in the neck.  CHEST  Scattered areas of hypermetabolic irregular pleural thickening are identified. Anterior right upper lobe index  pleural irregularity (image 60 series 4) has SUV max = 5. Other scattered similar areas are seen bilaterally.  Hypermetabolic left hilar lymphadenopathy is evident. Index, well-discriminated lymph node in the anterior left hilum (image 65 series 4) has SUV max = 4.3.  Scattered irregular bilateral pulmonary nodules are hypermetabolic. Index nodule seen in the left upper lobe on image 64 of series 4 is hypermetabolic with SUV max = 3.8. A cluster of right lower lobe nodules (image 79 series 4) demonstrates SUV max = 2.4.  ABDOMEN/PELVIS  2.7 x 2.5 cm hepatoduodenal ligament lymph node (image 110 series 4) is hypermetabolic with SUV max = 6.3. No other areas of suspicious soft tissue hypermetabolism are seen in the abdomen or pelvis.  SKELETON  The lytic T2 lesion seen on recent cervical spine CT is hypermetabolic with SUV max = 5.4. The lytic lesion at T2 is also hypermetabolic with SUV max = 3.5. 1.9 cm lytic lesion in the T9 vertebral body is hypermetabolic with SUV max = 3.9. Index lytic lesion in the posterior right iliac crest has SUV max = 3.7. The tiny hypermetabolic focus in the clivus is associated with numerous other scattered foci of hypermetabolism  in the the rectal lumbar spine and bony pelvis suspicious for marrow involvement although no associated abnormality can be identified on CT imaging.  IMPRESSION: Hypermetabolic mediastinal and left hilar lymphadenopathy is associated with scattered areas of hypermetabolic irregular bilateral pleural thickening and bilateral hypermetabolic irregular pulmonary nodules. The uptake in these regions is higher than would be expected for an infectious/inflammatory etiology, making these regions concerning for metastatic involvement.  2.7 cm markedly hypermetabolic hepatoduodenal ligament lymph node in the abdomen, consistent with metastatic disease.  Lytic, hypermetabolic lesions at C2, T2, T9, and right iliac crest, consistent with metastatic disease. Other scattered  foci of hypermetabolism within the marrow show no associated CT abnormality, but also likely reflect metastatic marrow involvement.   Electronically Signed   By: Misty Stanley M.D.   On: 08/11/2014 16:35    Labs: Lab Results  Component Value Date   WBC 12.9* 04/18/2014   HCT 43.9 04/18/2014   MCV 83.6 04/18/2014   PLT 350 04/18/2014   NA 142 04/18/2014   K 3.4* 04/18/2014   CL 96 02/13/2014   CO2 29 04/18/2014   GLUCOSE 90 04/18/2014   BUN 7.7 04/18/2014   CREATININE 0.7 04/18/2014   CALCIUM 10.2 04/18/2014   PROT 7.5 04/18/2014   ALBUMIN 3.6 04/18/2014   AST 23 04/18/2014   ALT 34 04/18/2014   ALKPHOS 106 04/18/2014   BILITOT 0.23 04/18/2014   GFRNONAA >60 07/16/2011   GFRAA >60 07/16/2011   INR 1.00* 07/11/2009    Assessment and Plan: Madison [name] is a 38 y.o. female with history of breast carcinoma and recent PET scan revealing hypermetabolic lymphadenopathy as well as bony lytic lesions. She presents today for CT guided biopsy of a right iliac crest lytic lesion.   Details/risks of procedure d/w pt/family with their understanding and consent.                Signed: Autumn Messing 08/22/2014, 9:00 AM

## 2014-08-23 ENCOUNTER — Ambulatory Visit: Payer: Self-pay | Admitting: Oncology

## 2014-08-24 ENCOUNTER — Ambulatory Visit (HOSPITAL_BASED_OUTPATIENT_CLINIC_OR_DEPARTMENT_OTHER): Payer: Medicare Other | Admitting: Oncology

## 2014-08-24 ENCOUNTER — Encounter: Payer: Self-pay | Admitting: Oncology

## 2014-08-24 ENCOUNTER — Other Ambulatory Visit: Payer: Self-pay | Admitting: Oncology

## 2014-08-24 ENCOUNTER — Telehealth: Payer: Self-pay | Admitting: *Deleted

## 2014-08-24 ENCOUNTER — Other Ambulatory Visit: Payer: Self-pay | Admitting: *Deleted

## 2014-08-24 ENCOUNTER — Telehealth: Payer: Self-pay | Admitting: Oncology

## 2014-08-24 VITALS — BP 113/65 | HR 95 | Temp 98.1°F | Resp 18 | Ht 65.0 in | Wt 260.9 lb

## 2014-08-24 DIAGNOSIS — C50019 Malignant neoplasm of nipple and areola, unspecified female breast: Secondary | ICD-10-CM

## 2014-08-24 DIAGNOSIS — C7800 Secondary malignant neoplasm of unspecified lung: Secondary | ICD-10-CM

## 2014-08-24 DIAGNOSIS — F329 Major depressive disorder, single episode, unspecified: Secondary | ICD-10-CM

## 2014-08-24 DIAGNOSIS — F3289 Other specified depressive episodes: Secondary | ICD-10-CM

## 2014-08-24 DIAGNOSIS — F341 Dysthymic disorder: Secondary | ICD-10-CM

## 2014-08-24 DIAGNOSIS — C7952 Secondary malignant neoplasm of bone marrow: Secondary | ICD-10-CM

## 2014-08-24 DIAGNOSIS — C7951 Secondary malignant neoplasm of bone: Secondary | ICD-10-CM

## 2014-08-24 DIAGNOSIS — C773 Secondary and unspecified malignant neoplasm of axilla and upper limb lymph nodes: Secondary | ICD-10-CM

## 2014-08-24 DIAGNOSIS — C50919 Malignant neoplasm of unspecified site of unspecified female breast: Secondary | ICD-10-CM

## 2014-08-24 MED ORDER — ONDANSETRON HCL 4 MG PO TABS: 4.0000 mg | ORAL_TABLET | Freq: Three times a day (TID) | ORAL | Status: DC | PRN

## 2014-08-24 MED ORDER — MORPHINE SULFATE ER 15 MG PO TBCR: 15.0000 mg | EXTENDED_RELEASE_TABLET | Freq: Two times a day (BID) | ORAL | Status: DC

## 2014-08-24 MED ORDER — PROCHLORPERAZINE MALEATE 10 MG PO TABS: 10.0000 mg | ORAL_TABLET | Freq: Four times a day (QID) | ORAL | Status: AC | PRN

## 2014-08-24 MED ORDER — MORPHINE SULFATE 15 MG PO TABS: 15.0000 mg | ORAL_TABLET | Freq: Three times a day (TID) | ORAL | Status: DC | PRN

## 2014-08-24 NOTE — Telephone Encounter (Signed)
gv adn printed appt sched and avs for pt for OCT °

## 2014-08-24 NOTE — Progress Notes (Signed)
Madison [name]  [phone] [phone]  OFFICE PROGRESS NOTE     ID: Madison [name]   DOB: [birth year]  MR#: [id number]  HDQ#:[id number]   PCP: Conni Slipper, MD GYN: Everett Graff SU: Osborn Coho OTHER MD: Thea Silversmith, Cormac O'Donovan, Glori Bickers  CHIEF COMPLAINT: Recurrent/metastatic breast cancer CURRENT TREATMENT: Hospice referral   HISTORY OF PRESENT ILLNESS: From Dr. Collier Salina [name]'s new patient evaluation note dated 04/05/2009:  "This woman is here today with her mother and mother's friend.  She apparently was noted to have a palpable mass in the left breast at about the 3 o'clock position back in October.  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  She had a 85-monthfollowup mammogram.  In March of this year, she began experiencing pain in her breast.  [facility].  She had a mammogram on 02/28/2009.  Physical exam at that time showed a palpable superficial subareolar mass extending from the 12 to 3 o'clock position measuring 2 cm.  On physical exam, ultrasound showed this to be about a 2.4 x 2 x 1.4 cm mass with mild posterior acoustic enhancement.  This was a lobular and hypoechoic mass felt to be consistent with a fibroadenoma but malignancy cannot be ruled out.  Of note is that the mass previously measured 1.7 x 1.4 x 1.1 cm back in October.  No abscess was seen.  No axillary adenopathy was seen.  The patient was referred to Dr. SMargot [name]  Because of the feeling this represented a fibroadenoma, an excision was planned and performed on 04/02/2009.  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  Margins were positive for tumor.  Lymphovascular invasion was not identified.  Prior to surgery and there was a concern about an  additional lesion seen.  Additional breast tissue removed from the primary surgery showed the fibrocystic changes and intraductal papilloma.  No other evidence of malignancy.  This tumor was noted to be ER and PR positive.  Special stains were also performed and apparently this was negative for chromogranin and minimal staining for synaptophysin.  Total dimensions of this mass could not be accurately assessed.  It was felt to be about 1.5 cm.  Because of some trabecular and organoid appearance of this lesion in some of the sections, special stains were performed and the tumor was felt to be high grade.  The patient has had an unremarkable postoperative course."    Her subsequent history is as detailed below.   INTERVAL HISTORY: NBrynnleyreturns today for followup of her left-sided invasive ductal carcinoma  accompanied by her mother and a friend. Since the last visit here in May call had restaging studies which show widespread metastatic disease involving lungs bones and lymph nodes. I called her with these results and set her up for a bone marrow biopsy to confirm that this was metastatic disease. Bone marrow biopsy was performed 08/22/2014 and showed ([id number]) metastatic adenocarcinoma with extracellular mucin. The preliminary report shows of these cells were estrogen receptor positive.  REVIEW OF SYSTEMS: NElmyra Rickscontinues to have pain "all over". The pain is stabbing throbbing aching and cramping. It is constant. She is very tired. She is in bed most of the day. She is using a walker today. Her feet or swelling. She has an  irregular heartbeat. She has severe heartburn. She has a history of seizures, but they seem to be well controlled at present. She has frequent headaches. These are however not a change for her. She feels very forgetful. She is on a very high dose of Neurontin in addition to Lyrica and she thinks this may be the reason for the forgetfulness. She is anxious and depressed but denies  suicidal thoughts. She is experiencing frequent hot flashes.  PAST MEDICAL HISTORY: Past Medical History  Diagnosis Date  . Tobacco user   . Grand mal seizure   . Fibromyalgia   . Seizures   . Breast CA   . GERD (gastroesophageal reflux disease)   . Depression   . Arthritis   . Allergy   . Hypertension     PAST SURGICAL HISTORY: Past Surgical History  Procedure Laterality Date  . Appendectomy    . Tonsillectomy    . Tubal ligation    . Mastectomy      bilateral  . Cholecystectomy  March 2011  . Abdominal hysterectomy  March 2012  . Breast surgery      bilateral mastectomy  . Port-a-cath insertion      FAMILY HISTORY Family History  Problem Relation Age of Onset  . Diabetes Sister   . Hypertension Maternal Grandfather   Both parents alive, but divorced  Her mother is the Chiropodist at [employer].  She has a sister and brother both of whom are alive and well in good health.  GYNECOLOGIC HISTORY: She is GX, P2, menarche age 60.  She underwent total abdominal hysterectomy with bilateral salpingo-oophorectomy in 01/2011.  SOCIAL HISTORY: She is separated.  Originally from [place], went to [employer] and subsequently went to [employer]. She is disabled secondary to her fibromyalgia and seizure disorder..  Her daughter Madison [name] (pronounced "Madison [name]") is currently 39, and lives with the patient. Son Madison [name], 21, is a special needs child and lives in a group home.   ADVANCED DIRECTIVES: In place. The patient has a living will but requests no extraordinary support in case of a terminal event. The patient's healthcare power of attorney is her mother, Madison [name], cell number [phone]   HEALTH MAINTENANCE: History  Substance Use Topics  . Smoking status: Current Every Day Smoker -- 1.00 packs/day for 25 years    Types: Cigarettes  . Smokeless tobacco: Never Used     Comment: 1 ppd for 20 years   . Alcohol Use: No     Colonoscopy: Never  PAP: s/p  hysterectomy  Bone density: pending  Lipid panel: Not on file  Allergies  Allergen Reactions  . Taxol [Paclitaxel] Shortness Of Breath  . Adhesive [Tape] Rash  . Ultram [Tramadol Hcl] Rash    Current Outpatient Prescriptions  Medication Sig Dispense Refill  . Alum & Mag Hydroxide-Simeth (MAGIC MOUTHWASH) SOLN Take 5 mLs by mouth 3 (three) times daily as needed for mouth pain.  120 mL  0  . anastrozole (ARIMIDEX) 1 MG tablet Take 1 mg by mouth every morning.      Marland Kitchen antiseptic oral rinse (BIOTENE) LIQD 15 mLs by Mouth Rinse route as needed for dry mouth.      . captopril-hydrochlorothiazide (CAPOZIDE) 25-25 MG per tablet Take 1 tablet by mouth 2 (two) times daily.  60 tablet  5  . carvedilol (COREG) 6.25 MG tablet Take 1 tablet (6.25 mg total) by mouth 2 (two) times daily.  180 tablet  3  . clonazePAM (KLONOPIN) 0.5 MG  tablet Take 0.5-1 mg by mouth 2 (two) times daily.      . clonazePAM (KLONOPIN) 1 MG disintegrating tablet Take 2 tablets (2 mg total) by mouth 2 (two) times daily as needed.  60 tablet  3  . cyclobenzaprine (FLEXERIL) 10 MG tablet Take 10 mg by mouth at bedtime.      . Diazepam (DIASTAT ACUDIAL) 20 MG GEL Place 20 mg rectally as needed (for seizures longer than 5 minutes or seizures clusters.).  2 each  3  . DULoxetine (CYMBALTA) 60 MG capsule Take 60 mg by mouth 2 (two) times daily.      . fluticasone (FLONASE) 50 MCG/ACT nasal spray Place 2 sprays into both nostrils daily as needed for allergies or rhinitis.      Marland Kitchen gabapentin (NEURONTIN) 300 MG capsule Take 1,200 mg by mouth 3 (three) times daily.       . LamoTRIgine 300 MG TB24 Take 1 tablet by mouth 2 (two) times daily.      Marland Kitchen levETIRAcetam (KEPPRA) 1000 MG tablet Take 3,000 mg by mouth 2 (two) times daily.      Marland Kitchen lidocaine (XYLOCAINE) 2 % solution Take 20 mLs by mouth as needed. Pt states she uses after a seizure.      Marland Kitchen LYRICA 150 MG capsule Take 150 mg by mouth 2 (two) times daily.       . Melatonin 10 MG TABS Take 1  tablet by mouth at bedtime.      Marland Kitchen omeprazole (PRILOSEC) 40 MG capsule Take 40 mg by mouth every morning.      . ondansetron (ZOFRAN) 4 MG tablet Take 4 mg by mouth every 8 (eight) hours as needed for nausea or vomiting.       Marland Kitchen oxyCODONE-acetaminophen (PERCOCET) 10-325 MG per tablet Take 0.5 tablets by mouth every 8 (eight) hours as needed for pain.      . ranitidine (ZANTAC) 150 MG tablet Take 150 mg by mouth at bedtime.        No current facility-administered medications for this visit.   Facility-Administered Medications Ordered in Other Visits  Medication Dose Route Frequency Provider Last Rate Last Dose  . heparin lock flush 100 unit/mL  500 Units Intravenous Once Eston Esters, MD      . sodium chloride 0.9 % injection 10 mL  10 mL Intravenous PRN Eston Esters, MD      . sodium chloride 0.9 % injection 10 mL  10 mL Intravenous PRN Chauncey Cruel, MD   10 mL at 10/18/13 1434    OBJECTIVE: Young white woman walking with a walker Filed Vitals:   08/24/14 1204  BP: 113/65  Pulse: 95  Temp: 98.1 F (36.7 C)  Resp: 18     Body mass index is 43.42 kg/(m^2).      ECOG FS: 3 - Symptomatic, >50% confined to bed  Sclerae unicteric, pupils round and equal Oropharynx clear, teeth in good repair No cervical or supraclavicular adenopathy Lungs no rales or rhonchi, fair excursion bilaterally Heart regular rate and rhythm Abd soft, obese, nontender, positive bowel sounds MSK diffuse tenderness to mild palpation over the spine Neuro: nonfocal, alert and oriented x3, pleasant affect Breasts:    LAB RESULTS: Lab Results  Component Value Date   WBC 9.6 08/22/2014   NEUTROABS 8.7* 04/18/2014   HGB 13.2 08/22/2014   HCT 41.6 08/22/2014   MCV 85.8 08/22/2014   PLT 311 08/22/2014      Chemistry  Component Value Date/Time   NA 142 04/18/2014 1101   NA 139 02/13/2014 1002   K 3.4* 04/18/2014 1101   K 3.8 02/13/2014 1002   CL 96 02/13/2014 1002   CL 105 07/27/2012 1300   CO2 29 04/18/2014  1101   CO2 30 02/13/2014 1002   BUN 7.7 04/18/2014 1101   BUN 10 02/13/2014 1002   CREATININE 0.7 04/18/2014 1101   CREATININE 0.70 02/13/2014 1002   CREATININE 0.83 01/15/2012 1331      Component Value Date/Time   CALCIUM 10.2 04/18/2014 1101   CALCIUM 10.1 02/13/2014 1002   ALKPHOS 106 04/18/2014 1101   ALKPHOS 108 01/11/2013 1440   AST 23 04/18/2014 1101   AST 15 01/11/2013 1440   ALT 34 04/18/2014 1101   ALT 19 01/11/2013 1440   BILITOT 0.23 04/18/2014 1101   BILITOT 0.2* 01/11/2013 1440       Lab Results  Component Value Date   LABCA2 23 07/27/2012    No components found with this basename: [id number]     Recent Labs Lab 08/22/14 0950  INR 0.99    Urinalysis    Component Value Date/Time   COLORURINE YELLOW 07/16/2011 1040   APPEARANCEUR CLOUDY* 07/16/2011 1040   LABSPEC 1.019 07/16/2011 1040   LABSPEC 1.015 08/01/2009 1035   PHURINE 6.0 07/16/2011 1040   GLUCOSEU NEGATIVE 07/16/2011 1040   HGBUR TRACE* 07/16/2011 1040   BILIRUBINUR NEG 03/09/2014 1725   BILIRUBINUR NEGATIVE 07/16/2011 1040   KETONESUR TRACE* 07/16/2011 1040   PROTEINUR NEG 03/09/2014 1725   PROTEINUR NEGATIVE 07/16/2011 1040   UROBILINOGEN 0.2 03/09/2014 1725   UROBILINOGEN 0.2 07/16/2011 1040   NITRITE NEG 03/09/2014 1725   NITRITE NEGATIVE 07/16/2011 1040   LEUKOCYTESUR Negative 03/09/2014 1725    STUDIES: Patient: CHRISS, REDEL Collected: 08/22/2014 Client: [facility] Accession: [id number] Received: 08/22/2014 Corrie Mckusick, DO DOB: 1976-04-16 Age: 39 Gender: F Reported: 08/23/2014 [address] Patient Ph: [phone] MRN#: [id number] [place], [place] [address] Client Acc#: Chart: Phone: [phone] Fax: LMP: Visit#: [id number] CC: Sarajane Jews Magrinat CYTOPATHOLOGY REPORT Adequacy Reason Satisfactory For Evaluation. Diagnosis BONE, FINE NEEDLE ASPIRATION, RIGHT ILIAC (SPECIMEN 1 OF 1 COLLECTED 08/22/14): MALIGNANT CELLS CONSISTENT WITH METASTATIC ADENOCARCINOMA WITH EXTRACELLULAR MUCIN. Aldona Bar MD Pathologist, Electronic Signature (Case signed 08/23/2014)  Mr Cervical Spine W Wo Contrast  08/13/2014   CLINICAL DATA:  Neck pain. Breast cancer. Abnormal CT cervical spine  EXAM: MRI CERVICAL SPINE WITHOUT AND WITH CONTRAST  TECHNIQUE: Multiplanar and multiecho pulse sequences of the cervical spine, to include the craniocervical junction and cervicothoracic junction, were obtained according to standard protocol without and with intravenous contrast.  CONTRAST:  66m MULTIHANCE GADOBENATE DIMEGLUMINE 529 MG/ML IV SOLN  COMPARISON:  CT 08/07/2014  FINDINGS: Image quality degraded by motion.  Abnormality in the bone at C2 posteriorly and on the left compatible with metastatic disease as noted on CT. No tumor extension into the canal. No cord compression.  Bone marrow tumor at T2 present. This is a lytic lesion on CT. No tumor in the canal.  Negative for fracture.  Normal alignment with mild kyphosis  C2-3:  Negative disc space  C3-4:  Mild spondylosis without spinal stenosis  C4-5:  Negative  C5-6: Disc degeneration and spondylosis with central osteophyte and mild spinal stenosis. Mild foraminal stenosis bilaterally  C6-7: Left foraminal narrowing due to spurring  C7-T1:  Negative  IMPRESSION: Metastatic disease posterior elements at C2 involving the spinous processes and left-sided lamina and facet.  Review of the CT reveals a pathologic fracture through the spinous process of C2.  Metastatic disease T2 vertebral body without fracture.  Cervical spondylosis most notably at C5-6.   Electronically Signed   By: Franchot Gallo M.D.   On: 08/13/2014 18:05   Mr Thoracic Spine W Wo Contrast  08/14/2014   CLINICAL DATA:  Neck and back pain.  Frequent falls.  Breast cancer.  EXAM: MRI THORACIC SPINE WITHOUT AND WITH CONTRAST  TECHNIQUE: Multiplanar and multiecho pulse sequences of the thoracic spine were obtained without and with intravenous contrast.  CONTRAST:  20 mL MultiHance  COMPARISON:  PET-CT  08/11/2014  FINDINGS: Images are mildly degraded by motion artifact.  Vertebral alignment is normal. Vertebral body heights are preserved without compression fracture. T2 hyperintense, enhancing lesions measure 1.3 cm in the T2 vertebral body, 2.3 cm in the T8 vertebral body, and 2.1 cm in the T9 vertebral body. The T8 lesion appears to disrupt the right lateral vertebral body cortex with slight extension into the right-sided paravertebral soft tissues. There also appears to be some disruption of the right lateral T9 vertebral body cortex by the lesion in that vertebral body. No epidural tumor is identified.  Small central disc protrusion at T2-3 and small right central disc protrusion at T6-7 do not result in spinal canal stenosis. No neural foraminal stenosis is identified. Thoracic spinal cord is normal in caliber and signal allowing for motion artifact. Incidental note is made of an azygos fissure.  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 No epidural tumor identified.   Electronically Signed   By: Logan Bores   On: 08/14/2014 08:10   Nm Pet Image Restag (ps) Skull Base To Thigh  08/11/2014   CLINICAL DATA:  Subsequent treatment strategy for breast cancer.  EXAM: NUCLEAR MEDICINE PET SKULL BASE TO THIGH  TECHNIQUE: 13.1 mCi F-18 FDG was injected intravenously. Full-ring PET imaging was performed from the skull base to thigh after the radiotracer. CT data was obtained and used for attenuation correction and anatomic localization.  FASTING BLOOD GLUCOSE:  Value: 102 mg/dl  COMPARISON:  Cervical spine CT from 08/07/2014. Abdomen and pelvis CT from 06/11/2009. Chest CT from 04/20/2009.  FINDINGS: NECK  No hypermetabolic lymph nodes in the neck.  CHEST  Scattered areas of hypermetabolic irregular pleural thickening are identified. Anterior right upper lobe index pleural irregularity (image 60 series 4) has SUV max = 5. Other scattered similar areas are seen bilaterally.   Hypermetabolic left hilar lymphadenopathy is evident. Index, well-discriminated lymph node in the anterior left hilum (image 65 series 4) has SUV max = 4.3.  Scattered irregular bilateral pulmonary nodules are hypermetabolic. Index nodule seen in the left upper lobe on image 64 of series 4 is hypermetabolic with SUV max = 3.8. A cluster of right lower lobe nodules (image 79 series 4) demonstrates SUV max = 2.4.  ABDOMEN/PELVIS  2.7 x 2.5 cm hepatoduodenal ligament lymph node (image 110 series 4) is hypermetabolic with SUV max = 6.3. No other areas of suspicious soft tissue hypermetabolism are seen in the abdomen or pelvis.  SKELETON  The lytic T2 lesion seen on recent cervical spine CT is hypermetabolic with SUV max = 5.4. The lytic lesion at T2 is also hypermetabolic with SUV max = 3.5. 1.9 cm lytic lesion in the T9 vertebral body is hypermetabolic with SUV max = 3.9. Index lytic lesion in the posterior right iliac crest has SUV max = 3.7. The tiny hypermetabolic  focus in the clivus is associated with numerous other scattered foci of hypermetabolism in the the rectal lumbar spine and bony pelvis suspicious for marrow involvement although no associated abnormality can be identified on CT imaging.  IMPRESSION: Hypermetabolic mediastinal and left hilar lymphadenopathy is associated with scattered areas of hypermetabolic irregular bilateral pleural thickening and bilateral hypermetabolic irregular pulmonary nodules. The uptake in these regions is higher than would be expected for an infectious/inflammatory etiology, making these regions concerning for metastatic involvement.  2.7 cm markedly hypermetabolic hepatoduodenal ligament lymph node in the abdomen, consistent with metastatic disease.  Lytic, hypermetabolic lesions at C2, T2, T9, and right iliac crest, consistent with metastatic disease. Other scattered foci of hypermetabolism within the marrow show no associated CT abnormality, but also likely reflect  metastatic marrow involvement.   Electronically Signed   By: Misty Stanley M.D.   On: 08/11/2014 16:35   Ct Biopsy  08/22/2014   CLINICAL DATA:  38 year old female with a history of breast carcinoma. She now has evidence of metastatic disease with FDG avid lesions on PET-CT. She is referred for targeted biopsy of right iliac crest FDG avid lesion.  EXAM: CT GUIDED fine needle biopsy and core BIOPSY OF right iliac crest lesion  ANESTHESIA/SEDATION: 2  mg IV Versed; 100 mcg IV Fentanyl  Total Moderate Sedation Time: 20 minutes.  PROCEDURE: The procedure risks, benefits, and alternatives were explained to the patient. Questions regarding the procedure were encouraged and answered. The patient understands and consents to the procedure.  A scout CT of the pelvis was acquired for surgical planning purposes. Once we determine a safe approach to the right iliac crest lesion, a fiducial marker was placed on the patient's skin, and another limited CT image was acquired.  The posterior skin overlying the lesion was prepped with Betadinein a sterile fashion, and a sterile drape was applied covering the operative field. A sterile gown and sterile gloves were used for the procedure. Local anesthesia was provided with 1% Lidocaine. A spinal needle was advanced to the posterior cortex the right iliac bone with a final image acquired for location.  A small stab incision was then made with an 11 blade scalpel, and a 15 cm, 11 gauge Murphy needle was advanced to the posterior cortex of the right iliac bone. We confirmed position with a CT image. The needle was then advanced with manual forced into the posterior cortex and to the margin of the target lesion. Again, the tip of the needle was confirmed within the lesion after withdrawing the right sharp stylet from within the trocar.  We then sample the lesion with for separate 20 gauge Francine needles, 2 separate 18 gauge core biopsy, and then a final 11 gauge core biopsy with the  [facility] needle. All of the tissue specimen were passed to a cytotechnologist in the room for slide preparation and transportation of pathology lab.  Upon withdrawal of the Murphy needle, a sterile bandage was placed.  The patient tolerated the procedure well and remained hemodynamically stable throughout.  No complications were encountered and no significant blood loss encounter.  Complications: None  FINDINGS: Lytic lesion within the right posterior iliac bone, similar to comparison PET-CT.  Images during the case demonstrate placement of 11 gauge Murphy needle at the margin of the lesion. All samples were completed through the [facility] needle, with the exception of the final 11 gauge core biopsy, which was acquired with the [facility] needle itself.  IMPRESSION: Status post CT-guided biopsy of right posterior  iliac bone lesion, with tissue specimen sent to pathology for complete histopathologic analysis.  Signed,  Dulcy Fanny. Earleen Newport, DO  [facility]  [facility]   Electronically Signed   By: Corrie Mckusick O.D.   On: 08/22/2014 12:45     ASSESSMENT: Madison [name] is a  38 y.o.  [place],  woman:  (1) Status post left lumpectomy 04/02/2009 for a pT1c NX invasive ductal carcinoma, grade 3, estrogen and progesterone receptor positive, HER-2 not amplified, with positive margins  (2) Status post left modified radical mastectomy 04/09/2009 showing no residual tumor in the breast, but 1/26 lymph nodes involved, for final stage of pT1c pN1, stage IIA; status post subsequent right mastectomy with benign pathology  (3) Treated adjuvantly according to the ECOG E5103 study, with 4 cycles of dose dense doxorubicin and cyclophosphamide, followed by 10 doses of weekly paclitaxel, all given with bevacizumab or placebo.  (4) Completed postmastectomy radiation to the left chest wall and left supraclavicular fossa 12/07/2009  (5) Received Tamoxifen from 01/2010 until  01/2011  (6) Status post TAH-BSO 02/17/2011 with benign pathology  (7) Started anastrozole in 03/2011; discontinued September 2015 with a diagnosis of metastatic disease.  (8) Genetic counseling requested 04/18/2014  METASTATIC DISEASE: (9) metastatic recurrence involving mediastinal and left hilar lymph nodes, bilateral pleural thickening, bilateral pulmonary nodules, the hepatoduodenal ligament lymph nodes, and numerous bony lesions including C2 T2-T8 and T9 noted on scans September 2015; right iliac bone marrow biopsy 08/22/2014 confirms metastatic adenocarcinoma with extracellular mucin, estrogen receptor positive  (10) patient refuses life-prolonging treatment; hospice referral place 08/24/2014  (a) morphine Contin 15 mg twice a day with MSIR 15 mg 3 times a day when necessary started 08/25/2014  (b) bowel prophylaxis with daily MiraLAX and stool softeners, 2 tablets twice daily, started 08/25/2014   PLAN: I spent approximately one hour with Madison [name] and her mother and friend going over her situation. She understands she has stage IV breast cancer involving lungs, bones, and lymph nodes. Unfortunately we do not know how to cure stage IV breast cancer. The goal of treatment is control.  It would be very reasonable for her to receive fulvestrant and Palbociclib and that likely would note only prolong her life but possibly ameliorate some of her symptoms. We should also have multiple chemotherapy agents that could be useful in this setting. We discussed the possible toxicities, side effects and complications of these agents, which are generally well-tolerated and we also discussed zolendronate for the bony metastatic disease.  However Tiney really does not want any treatment. She just wants to be made comfortable. She already has a living will in place and her mother is healthcare power of attorney. She understands that under these circumstances she may well die within the next few months.  Unfortunately her quality of life is already so poor she does not want a prolonged period  I have given her information on kids path. We will place a hospice referral today. She will start MS Contin at 15 mg twice a day, with MSIR for breakthrough pain. She understands the possible side effects toxicities and complications of these agents including nausea (I wrote for Zofran and Compazine when necessary) and constipation. We discussed a bowel prophylaxis regimen. She is also going off the anastrozole and tapering off the gabapentin. She will continue on Lyrica as before.  I have made it [place] a return appointment here in a little under a month to make sure everything is in place.  Usually I see my hospice patients every 2 months thereafter.  This patient should not be resuscitated in case of a terminal event.   Chauncey Cruel, MD  08/24/2014   12:07 PM

## 2014-08-24 NOTE — Telephone Encounter (Signed)
Received prior authorization request from [facility] for Ondansetron 4mg  tablet.  Gave request to care management.

## 2014-08-24 NOTE — Progress Notes (Signed)
[facility], [id number], approved ondansetron 4mg  from 12/01/13-11/30/14

## 2014-08-25 ENCOUNTER — Other Ambulatory Visit: Payer: Self-pay | Admitting: Family Medicine

## 2014-08-25 ENCOUNTER — Encounter: Payer: Self-pay | Admitting: *Deleted

## 2014-08-25 ENCOUNTER — Ambulatory Visit: Payer: Self-pay | Admitting: Neurology

## 2014-08-25 ENCOUNTER — Telehealth: Payer: Self-pay | Admitting: Neurology

## 2014-08-25 NOTE — Telephone Encounter (Signed)
Pt caregiver called and states that she could not get the pt here for appt on 08-25-14 so Dr Delice Lesch said that we could resch appt. She has appt on 10-02-14 and she is on the cancellation list

## 2014-08-25 NOTE — Addendum Note (Signed)
Addended by: Laureen Abrahams on: 08/25/2014 05:43 PM   Modules accepted: Orders, Medications

## 2014-08-29 ENCOUNTER — Encounter: Payer: Self-pay | Admitting: Family Medicine

## 2014-08-29 ENCOUNTER — Telehealth: Payer: Self-pay | Admitting: Family Medicine

## 2014-08-29 ENCOUNTER — Ambulatory Visit (INDEPENDENT_AMBULATORY_CARE_PROVIDER_SITE_OTHER): Payer: Medicare Other | Admitting: Family Medicine

## 2014-08-29 VITALS — BP 136/69 | HR 97 | Temp 98.6°F | Ht 65.0 in | Wt 260.2 lb

## 2014-08-29 DIAGNOSIS — C50919 Malignant neoplasm of unspecified site of unspecified female breast: Secondary | ICD-10-CM

## 2014-08-29 NOTE — Telephone Encounter (Signed)
Pt asked me about if flu shot was contraindicated in her today, as she seems to remember it was in the past due to her cancer. I told her I did not think it would be at this time, but will forward this question to her oncologist Dr Jana Hakim.  [name], MD

## 2014-08-29 NOTE — Progress Notes (Signed)
Patient ID: Madison [name], female   DOB: 10/27/1976, 38 y.o.   MRN: [id number] Subjective:   CC: Follow up to update on cancer  HPI:   Madison [name] is a 38 y.o. female with h/o breast cancer, malignant, that was diagnosed at a young age. She presents with her mother today to discuss her recently diagnosed metastases of her breast cancer. She is s/p bilateral mastectomy. She was recently found to have metastases in multiple locations via PET scan and bone marrow biopsy. She sees Dr Magrinat who has presented various treatment options. She currently does not want any treatment because she feels it just will prolong the inevitable. She reports today her biggest symptom is left leg pain. They have prescribed MS Contin which helps. She is seeing hospice now who did the initial visit at her home 4 days ago. She will get a nurse visit 2x/week, personal care assistant daily, social worker, chaplain, and volunteer. She is overwhelmed but she and her mom think this will be good. She just had a wheelchair delivered at home. She has a concern about her frequent issues with UTIs and chronic sinusitis. She wants to make sure that she knows who to contact if she has issues with these things - me as her medical doctor or Hospice.   She also wants to update me that she is seeing a neurology specialist in epilepsy at [facility] to make visits easier (since Dr Wong is in [place]).  Review of Systems - Per HPI.   PMH: Medications - She has 5 more days until she has weaned off neurontin and wants me to update her chart. She thinks she feels better weaning off this. Meds: completing gabapentin in next 5 days (weaning off)    Objective:  Physical Exam BP 136/69  Pulse 97  Temp(Src) 98.6 F (37 C) (Oral)  Ht 5' 5" (1.651 m)  Wt 260 lb 3.2 oz (118.026 kg)  BMI 43.30 kg/m2 GEN: NAD, seated in exam room PULM: Normal effort PSYCH: Mood and affect mildly flat, mildly down NEURO: Seated, walks with help from mother  and rolling walker    Assessment:     Madison [name] is a 38 y.o. female with recently diagnosed metastatic breast cancer here for follow up to update me.    Plan:     # See problem list and after visit summary for problem-specific plans.  Follow-up: Follow up in 1 month to f/u how you are doing with all of these changes, or sooner if needed.   I have spent at least 25 minutes in room with patient, >50% of this time used in counseling.    T , MD [facility]   

## 2014-08-29 NOTE — Assessment & Plan Note (Addendum)
Stage 4 breast cancer with the following hx: Invasive ductal carcinoma grade 3, ER and PR positive, HER-2 not amplified, s/p left modified radical mastectomy, right mastectomy, treated with 4 cycles of doxorubicin and cyclophosphamide and 10 doses of weekly paclitaxel, with postmastectomy radiation to left chest wall and left supraclavicular fossa 12/2009, tamoxifen, s/p TAH-BSO 01/2011 (benign), started anastrazole 03/2011, d/c 08/2014 with dx of metastatic disease (mediastinal and left hilar lymph nodes, bilateral pleural thickening, bilatearl pulmonary nodules, hepatoduodenal ligament lymph nodes, bony lesions C2, T2-T8, and T9, 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 Refusing life-prolonging tx. MS contin and MSIR started.  - Removed gabapentin from medication list as she has been weaning off of it and stops in 5 days. - Pt to ask hospice question about who to contact in case of a medical issue. Always feel free to contact me. - Can update me via MyChart. - I will contact Magrinat to ask about flu shot. I think it will benefit pt but she is concerned because in the past she was told by someone not to get it. - Consider psychological therapy, though for now the pastor coming to her house will fill that role for her in her opinion. - Consider Dr Magrinat's therapeutic options if any may help you feel more comfortable. - Has f/u with Dr Jana Hakim in <1 month. - DNR per Dr Magrinat's note - at f/u, will be sure this order has been placed.

## 2014-08-29 NOTE — Addendum Note (Signed)
Addended by: Conni Slipper T on: 08/29/2014 12:57 PM   Modules accepted: Orders

## 2014-09-01 ENCOUNTER — Other Ambulatory Visit: Payer: Self-pay | Admitting: *Deleted

## 2014-09-01 ENCOUNTER — Telehealth: Payer: Self-pay | Admitting: *Deleted

## 2014-09-01 MED ORDER — MAGIC MOUTHWASH: 5.0000 mL | Freq: Three times a day (TID) | ORAL | Status: AC | PRN

## 2014-09-01 NOTE — Telephone Encounter (Signed)
Received message from Wells Guiles, Pharmacist from [facility] needing the provider to call regarding the ingredients in the magic mouth wash.  She would like to know what formula is needed to be used; please call [phone].  Derl Barrow, RN

## 2014-09-04 ENCOUNTER — Telehealth: Payer: Self-pay | Admitting: Emergency Medicine

## 2014-09-04 ENCOUNTER — Encounter: Payer: Self-pay | Admitting: Neurology

## 2014-09-04 ENCOUNTER — Ambulatory Visit (INDEPENDENT_AMBULATORY_CARE_PROVIDER_SITE_OTHER): Admitting: Neurology

## 2014-09-04 VITALS — BP 98/62 | HR 90 | Ht 65.0 in | Wt 260.0 lb

## 2014-09-04 DIAGNOSIS — C50912 Malignant neoplasm of unspecified site of left female breast: Secondary | ICD-10-CM

## 2014-09-04 DIAGNOSIS — G40319 Generalized idiopathic epilepsy and epileptic syndromes, intractable, without status epilepticus: Secondary | ICD-10-CM | POA: Insufficient documentation

## 2014-09-04 DIAGNOSIS — G629 Polyneuropathy, unspecified: Secondary | ICD-10-CM

## 2014-09-04 DIAGNOSIS — R519 Headache, unspecified: Secondary | ICD-10-CM

## 2014-09-04 DIAGNOSIS — R51 Headache: Secondary | ICD-10-CM

## 2014-09-04 NOTE — Progress Notes (Signed)
NEUROLOGY CONSULTATION NOTE  Madison [name] MRN: [id number] DOB: 1976-03-15  Referring provider: Dr. Neena Rhymes Primary care provider: Dr. Conni Slipper  Reason for consult:  Establish care for seizures  Dear Dr Jacelyn Grip:  Thank you for your kind referral of Madison [name] for consultation of the above symptoms. Although her history is well known to you, please allow me to reiterate it for the purpose of our medical record. The patient was accompanied to the clinic by her mother who also provides collateral information. Records from [facility] were personally reviewed where available.  HISTORY OF PRESENT ILLNESS: This is a 39 year old left-handed woman with a history of metastatic breast cancer, hypertension, migraines, anxiety/depression, and idiopathic epilepsy with generalized seizures.  She had a single seizure occurred in 2008 and no medication was initiated at that time. Seizures recurred in October 2010 during chemotherapy. She would have clusters of seizures every 2-3 weeks. She denies any prior warning symptoms.  Semiology described as head turning to the right, loud guttural sound, falling to the floor if standing, becomes stiff and shakes. Afterwards, she reports feeling like "someone has beat me up, head hurts for several days and am in a fog." She had an EMU admission at [facility] in June 2012 which showed abnormal EEG with irregularly generalized paroxysms of spike and polyspike and wave complexes. Two seizures were captured consisting of decreased responsiveness followed by tonic clonic activity with right head turn and right arm elevation preceding, with generalized ictal onset, ?secondary bilateral synchrony. MRI of brain in August 2012 was normal, as was the EEG done at the same time. She continued to have seizures and had another EMU admission on from 09/05/13 - 09/10/13. Typical events were not captured.  She was feeling much better, more awake and alert with  medication taper, and home dose of Zonegran was discontinued. She resumed  Keppra and Lamictal doses at discharge. She was also taking Lyrica for leg pain, doses higher than 182m BID caused sedation.  She was tried on Neurontin up to 12068mTID, which also caused sedation and seizures. She is currently tapering this off as instructed by Dr. WoJacelyn Griplast dose on Friday. She had panic attacks/agoraphobia when briefly off Lyrica.  They report that she has not had any seizures since Keppra was increased to 300069mID, last seizure was in August.  She continues as well on Lamotrigine 200m40mD (higher doses caused dizziness and hallucinations).  She takes prn clonazepam 2mg 63mer seizure to prevent clustering.  She is tolerating current anti-epileptic medication regimen.  They deny any staring/unresponsive episodes, olfactory/gustatory hallucinations, rising epigastric sensation. She reports daily body jerks. She reports memory problems and fatigue. She denies any falls since Labor Day. She has been dealing with chronic low back pain radiating behind her left leg. She has tingling in the bottom of both feet and numbness in the left ulnar distribution. [facility]  She also continues to report daily 2/10 headaches, with a "slow/dull burn" in the frontal or occipital regions, no associated nausea, vomiting, photo/phonophobia. She reports these are different that her migraines, last migraine was 5-6 months ago.  She is now on MS Contin for the pain.  She takes Cymbalta 60mg 21mfor depression and pain.  Elavil has been weaned off, it was used for headache in the past. She denies any diplopia, she has dizziness described as floating with positional changes. She has some difficulty swallowing ("pills  get stuck"). She has constipation and is prone to bladder and sinus infections.  Breast CA history reviewed: She has Invasive ductal carcinoma grade 3, ER and PR positive, HER-2 not  amplified, s/p left modified radical mastectomy, right mastectomy, treated with 4 cycles of doxorubicin and cyclophosphamide and 10 doses of weekly paclitaxel, with postmastectomy radiation to left chest wall and left supraclavicular fossa 12/2009, tamoxifen, s/p TAH-BSO 01/2011 (benign), started anastrazole 03/2011, d/c 08/2014 with diagnosis of metastatic disease (mediastinal and left hilar lymph nodes, bilateral pleural thickening, bilateral pulmonary nodules, hepatoduodenal ligament lymph nodes, bony lesions C2, T2-T8, and T9, right iliac bone marrow biopsy with metastatic adenocarcinoma with extracellular mucin, ER positive. She is now on Hospice care.  She had a normal birth and early development. There is no history of febrile convulsions, CNS infections such as meningitis/encephalitis, significant traumatic brain injury, neurosurgical procedures, or family history of seizures.  Previous AEDs: Vimpat, Zonegran, Neurontin (for dysesthetic pain)  Laboratory Data 08/02/14 CBC - WBC 8.4, Hct 41.9, Hgb 13.3, Plt 303; CMP normal; folate >1235.6, vitamin B12 421, MMA 149, copper 124 EEGs: as above MRI at [facility] 07/07/13 per report: degraded by patient motion, normal, hippocampi symmetric. Images unavailable for review.    PAST MEDICAL HISTORY: Past Medical History  Diagnosis Date  . Tobacco user   . Grand mal seizure   . Fibromyalgia   . Seizures   . Breast CA   . GERD (gastroesophageal reflux disease)   . Depression   . Arthritis   . Allergy   . Hypertension     PAST SURGICAL HISTORY: Past Surgical History  Procedure Laterality Date  . Appendectomy    . Tonsillectomy    . Tubal ligation    . Mastectomy      bilateral  . Cholecystectomy  March 2011  . Abdominal hysterectomy  March 2012  . Breast surgery      bilateral mastectomy  . Port-a-cath insertion      MEDICATIONS: Current Outpatient Prescriptions on File Prior to Visit  Medication Sig Dispense Refill  . Alum & Mag  Hydroxide-Simeth (MAGIC MOUTHWASH) SOLN Take 5 mLs by mouth 3 (three) times daily as needed for mouth pain.  120 mL  0  . antiseptic oral rinse (BIOTENE) LIQD 15 mLs by Mouth Rinse route as needed for dry mouth.      . captopril-hydrochlorothiazide (CAPOZIDE) 25-25 MG per tablet Take 1 tablet by mouth 2 (two) times daily.  60 tablet  5  . carvedilol (COREG) 6.25 MG tablet Take 1 tablet (6.25 mg total) by mouth 2 (two) times daily.  180 tablet  3  . cyclobenzaprine (FLEXERIL) 10 MG tablet Take 10 mg by mouth at bedtime.      . DULoxetine (CYMBALTA) 60 MG capsule Take 60 mg by mouth 2 (two) times daily.      . fluticasone (FLONASE) 50 MCG/ACT nasal spray Place 2 sprays into both nostrils daily as needed for allergies or rhinitis.      . LamoTRIgine 300 MG TB24 Take 1 tablet by mouth 2 (two) times daily. Can not be increased due to hallucinations      . levETIRAcetam (KEPPRA) 1000 MG tablet Take 3,000 mg by mouth 2 (two) times daily.      Marland Kitchen lidocaine (XYLOCAINE) 2 % solution Take 20 mLs by mouth as needed. Pt states she uses after a seizure.      Marland Kitchen LYRICA 150 MG capsule Take 150 mg by mouth 2 (two) times daily.       Marland Kitchen  morphine (MS CONTIN) 15 MG 12 hr tablet Take 1 tablet (15 mg total) by mouth every 12 (twelve) hours.  60 tablet  0  . morphine (MSIR) 15 MG tablet Take 1 tablet (15 mg total) by mouth 3 (three) times daily as needed for severe pain.  30 tablet  0  . omeprazole (PRILOSEC) 40 MG capsule Take 1 capsule (40 mg total) by mouth daily.  45 capsule  2  . prochlorperazine (COMPAZINE) 10 MG tablet Take 1 tablet (10 mg total) by mouth every 6 (six) hours as needed for nausea or vomiting.  30 tablet  0  . ranitidine (ZANTAC) 150 MG tablet Take 150 mg by mouth at bedtime.        Current Facility-Administered Medications on File Prior to Visit  Medication Dose Route Frequency Provider Last Rate Last Dose  . heparin lock flush 100 unit/mL  500 Units Intravenous Once Eston Esters, MD      . sodium  chloride 0.9 % injection 10 mL  10 mL Intravenous PRN Eston Esters, MD      . sodium chloride 0.9 % injection 10 mL  10 mL Intravenous PRN Chauncey Cruel, MD   10 mL at 10/18/13 1434    ALLERGIES: Allergies  Allergen Reactions  . Taxol [Paclitaxel] Shortness Of Breath  . Adhesive [Tape] Rash  . Ultram [Tramadol Hcl] Rash    FAMILY HISTORY: Family History  Problem Relation Age of Onset  . Diabetes Sister   . Hypertension Maternal Grandfather     SOCIAL HISTORY: History   Social History  . Marital Status: Single    Spouse Name: N/A    Number of Children: N/A  . Years of Education: N/A   Occupational History  . Not on file.   Social History Main Topics  . Smoking status: Current Every Day Smoker -- 1.00 packs/day for 25 years    Types: Cigarettes  . Smokeless tobacco: Never Used     Comment: 1 ppd for 20 years   . Alcohol Use: No  . Drug Use: No  . Sexual Activity: Not Currently    Birth Control/ Protection: Surgical   Other Topics Concern  . Not on file   Social History Narrative   Living w parents in [place] due to cancer and inability to take car for herself or others. Has 3 children. 67 yo has bipolar and MR, currently in foster home. Other 2 live with her. Divorced.     REVIEW OF SYSTEMS: Constitutional: No fevers, chills, or sweats, + generalized fatigue, change in appetite Eyes: No visual changes, double vision, eye pain Ear, nose and throat: No hearing loss, ear pain, nasal congestion, sore throat Cardiovascular: No chest pain, palpitations Respiratory:  No shortness of breath at rest or with exertion, wheezes GastrointestinaI: No nausea, vomiting, diarrhea, abdominal pain, fecal incontinence Genitourinary:  No dysuria, urinary retention or frequency Musculoskeletal:  + neck pain, back pain Integumentary: No rash, pruritus, skin lesions Neurological: as above Psychiatric: + depression, insomnia, anxiety Endocrine: No palpitations, fatigue, diaphoresis,  mood swings, change in appetite, change in weight, increased thirst Hematologic/Lymphatic:  No anemia, purpura, petechiae. Allergic/Immunologic: no itchy/runny eyes, nasal congestion, recent allergic reactions, rashes  PHYSICAL EXAM: Filed Vitals:   09/04/14 0749  BP: 98/62  Pulse: 90   General: No acute distress, ambulates with walker, slight flat affect Head:  Normocephalic/atraumatic Eyes: Fundoscopic exam shows bilateral sharp discs, no vessel changes, exudates, or hemorrhages Neck: supple, no paraspinal tenderness, full range of motion  Back: No paraspinal tenderness Heart: regular rate and rhythm Lungs: Clear to auscultation bilaterally. Vascular: No carotid bruits. Skin/Extremities: No rash, no edema Neurological Exam: Mental status: alert and oriented to person, place, and time, no dysarthria or aphasia, Fund of knowledge is appropriate.  Recent and remote memory are intact.  Attention and concentration are normal.    Able to name objects and repeat phrases. Cranial nerves: CN I: not tested CN II: pupils equal, round and reactive to light, visual fields intact, fundi unremarkable. CN III, IV, VI:  full range of motion, no nystagmus, no ptosis CN V: decreased cold on left V2, intact to pin CN VII: upper and lower face symmetric CN VIII: hearing intact to finger rub CN IX, X: gag intact, uvula midline CN XI: sternocleidomastoid and trapezius muscles intact CN XII: tongue midline Bulk & Tone: normal, no fasciculations. Motor: 5/5 throughout with no pronator drift, pain on left LE testing Sensation: decreased vibration sense to bilateral ankles, reports hyperesthesia to pin on both feet. Intact to all modalities in both UE.  No extinction to double simultaneous stimulation.  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  She is currently on Keppra 3065m BID, Lamictal 2027mBID, and Lyrica 15056mID, with no seizures since August 2015.  She takes prn clonazepam 2mg6m prevent clustering and has not taken this since August.  Continue current plan to taper off Neurontin by Friday. She is more alert with this.  We discussed her low-grade daily headaches, option for headache prophylaxis, risks of side effects with additional medications. She is already on MS contin for back/leg pain as well.  We have agreed to hold off on addition of further medications at this time.  She is not driving and is aware of [place] driving laws that one should not drive until 6 months seizure-free. She will follow-up in 3 months and knows to call our office for any problems.    Thank you for allowing me to participate in the care of this patient. Please do not hesitate to call for any questions or concerns.   KareEllouise NewerD.  CC: Dr. ThekDianah Field. MagrJana Hakim

## 2014-09-04 NOTE — Telephone Encounter (Signed)
Please attempt to call pharmacy and ask them to use what they have filled for her in the past (same formulation). If they still have trouble, I will call them but will only be free around lunchtime. Thanks.  [name], MD

## 2014-09-04 NOTE — Telephone Encounter (Signed)
Per Seth Bake with Hospice; patient is taking MSO4 30mg  BID and MSO4 15mg  TID as needed for breakthrough pain.   States that patient is complaining of 8/10 pain; states that she is in "constant pain". States pain is in her legs at night, lower back, and neck area.   Seth Bake denies that the patient is heavily sedated and states that the patient answered the door today and carried a conversation with her as well.  Patient is also using ice compresses for pain relief.   Per Dr Jana Hakim, instructed Seth Bake to advise patient to take the MSO4 15mg  1-2 tablets every 4 hours as needed for breakthrough pain and to call this office in 2 days with a follow up.  Seth Bake verbalized understanding.

## 2014-09-04 NOTE — Patient Instructions (Signed)
1. Continue all your current medications as instructed 2. Please do not hesitate to call our office for any problems

## 2014-09-05 NOTE — Telephone Encounter (Signed)
Contacted pharmacy.  They will fill regular magic mouthwash swish 37mls 3xday with 5 refills.  [place], Madison [name]

## 2014-09-07 ENCOUNTER — Ambulatory Visit: Payer: Self-pay | Admitting: Physical Medicine & Rehabilitation

## 2014-09-12 ENCOUNTER — Other Ambulatory Visit: Payer: Self-pay | Admitting: *Deleted

## 2014-09-12 MED ORDER — MORPHINE SULFATE 15 MG PO TABS: 15.0000 mg | ORAL_TABLET | ORAL | Status: DC | PRN

## 2014-09-18 ENCOUNTER — Telehealth: Payer: Self-pay | Admitting: Nurse Practitioner

## 2014-09-18 ENCOUNTER — Encounter: Payer: Self-pay | Admitting: Nurse Practitioner

## 2014-09-18 ENCOUNTER — Telehealth: Payer: Self-pay | Admitting: *Deleted

## 2014-09-18 ENCOUNTER — Ambulatory Visit (HOSPITAL_BASED_OUTPATIENT_CLINIC_OR_DEPARTMENT_OTHER): Payer: Medicare Other | Admitting: Nurse Practitioner

## 2014-09-18 VITALS — BP 115/64 | HR 87 | Temp 98.7°F | Resp 18 | Ht 65.0 in | Wt 258.4 lb

## 2014-09-18 DIAGNOSIS — C50919 Malignant neoplasm of unspecified site of unspecified female breast: Secondary | ICD-10-CM | POA: Insufficient documentation

## 2014-09-18 DIAGNOSIS — C50912 Malignant neoplasm of unspecified site of left female breast: Secondary | ICD-10-CM

## 2014-09-18 DIAGNOSIS — Z170 Estrogen receptor positive status [ER+]: Secondary | ICD-10-CM

## 2014-09-18 DIAGNOSIS — C799 Secondary malignant neoplasm of unspecified site: Secondary | ICD-10-CM

## 2014-09-18 DIAGNOSIS — G893 Neoplasm related pain (acute) (chronic): Secondary | ICD-10-CM | POA: Insufficient documentation

## 2014-09-18 MED ORDER — NYSTATIN 100000 UNIT/GM EX POWD: 1.0000 g | Freq: Two times a day (BID) | CUTANEOUS | Status: AC

## 2014-09-18 MED ORDER — MORPHINE SULFATE ER 30 MG PO TBCR: 30.0000 mg | EXTENDED_RELEASE_TABLET | Freq: Two times a day (BID) | ORAL | Status: DC

## 2014-09-18 MED ORDER — MORPHINE SULFATE 15 MG PO TABS: 15.0000 mg | ORAL_TABLET | ORAL | Status: DC | PRN

## 2014-09-18 NOTE — Telephone Encounter (Signed)
Order fax to [facility].

## 2014-09-18 NOTE — Telephone Encounter (Signed)
per pof to sch pt appt-gave pt copy of sch °

## 2014-09-18 NOTE — Progress Notes (Addendum)
Mad[name]  [phone] [phone]  OFFICE PROGRESS NOTE     ID: Madison [name]   DOB: 1976-10-06  MR#: [id number]  [name]   PCP: Madison Slipper, MD GYN: [name] SU: [place] [name] OTHER MD: Madison [name], [place] [name], Madison [name]  CHIEF COMPLAINT: Recurrent/metastatic breast cancer CURRENT TREATMENT: Hospice  HISTORY OF PRESENT ILLNESS: From Dr. Collier Salina [name]'s new patient evaluation note dated 04/05/2009:  "This woman is here [name] with her mother and mother's friend.  She apparently was noted to have a palpable mass in the left breast at about the 3 o'clock position back in October.  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  She had a 72-monthfollowup mammogram.  In March of this year, she began experiencing pain in her breast.  [facility].  She had a mammogram on 02/28/2009.  Physical exam at that time showed a palpable superficial subareolar mass extending from the 12 to 3 o'clock position measuring 2 cm.  On physical exam, ultrasound showed this to be about a 2.4 x 2 x 1.4 cm mass with mild posterior acoustic enhancement.  This was a lobular and hypoechoic mass felt to be consistent with a fibroadenoma but malignancy cannot be ruled out.  Of note is that the mass previously measured 1.7 x 1.4 x 1.1 cm back in October.  No abscess was seen.  No axillary adenopathy was seen.  The patient was referred to Dr. SMargot [name]  Because of the feeling this represented a fibroadenoma, an excision was planned and performed on 04/02/2009.  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  Margins were positive for tumor.  Lymphovascular invasion was not identified.  Prior to surgery and there was a concern about an additional lesion  seen.  Additional breast tissue removed from the primary surgery showed the fibrocystic changes and intraductal papilloma.  No other evidence of malignancy.  This tumor was noted to be ER and PR positive.  Special stains were also performed and apparently this was negative for chromogranin and minimal staining for synaptophysin.  Total dimensions of this mass could not be accurately assessed.  It was felt to be about 1.5 cm.  Because of some trabecular and organoid appearance of this lesion in some of the sections, special stains were performed and the tumor was felt to be high grade.  The patient has had an unremarkable postoperative course."    Her subsequent history is as detailed below.   INTERVAL HISTORY: [name] for follow up of her metastatic breast cancer, accompanied by her mother and friend. She has been on hospice care since late September of this year. The interval history is unremarkable. She is taking MS Contin 137mBID and MSIR 151mvery 4 hours using about 4 doses per day of the immediate release. She rates her pain at 5/10 and is moderately satisfied. She has 2-3 soft bowel movements per week, but with "lots of assistance" and lactulose given to her by the hospice nurse. She continues to use a walker to ambulate. She takes naps after her pain medicine, but does not describe herself as overly drowsy or fatigued. Her appetite is unchanged and she is staying well hydrated. Her frequent headaches continue, but this has not worsened or changed.  REVIEW OF  SYSTEMS: A detailed review of systems is noncontributory except where noted above.    PAST MEDICAL HISTORY: Past Medical History  Diagnosis Date  . Tobacco user   . Grand mal seizure   . Fibromyalgia   . Seizures   . Breast CA   . GERD (gastroesophageal reflux disease)   . Depression   . Arthritis   . Allergy   . Hypertension     PAST SURGICAL HISTORY: Past Surgical History  Procedure Laterality Date  .  Appendectomy    . Tonsillectomy    . Tubal ligation    . Mastectomy      bilateral  . Cholecystectomy  March 2011  . Abdominal hysterectomy  March 2012  . Breast surgery      bilateral mastectomy  . Port-a-cath insertion      FAMILY HISTORY Family History  Problem Relation Age of Onset  . Diabetes Sister   . Hypertension Maternal Grandfather   Both parents alive, but divorced  Her mother is the Chiropodist at [name].  She has a sister and brother both of whom are alive and well in good health.  GYNECOLOGIC HISTORY: She is GX, P2, menarche age 28.  She underwent total abdominal hysterectomy with bilateral salpingo-oophorectomy in 01/2011.  SOCIAL HISTORY: She is separated.  Originally from [place], went to [place] [name] and subsequently went to [place] [name]. She is disabled secondary to her fibromyalgia and seizure disorder..  Her daughter Madison [name] (pronounced "Madison [name]") is currently 87, and lives with the patient. Son Madison [name], 21, is a special needs child and lives in a group home.   ADVANCED DIRECTIVES: In place. The patient has a living will but requests no extraordinary support in case of a terminal event. The patient's healthcare power of attorney is her mother, Madison [name], cell number [phone]   HEALTH MAINTENANCE: History  Substance Use Topics  . Smoking status: Current Every Day Smoker -- 1.00 packs/day for 25 years    Types: Cigarettes  . Smokeless tobacco: Never Used     Comment: 1 ppd for 20 years   . Alcohol Use: No     Colonoscopy: Never  PAP: s/p hysterectomy  Bone density: pending  Lipid panel: Not on file  Allergies  Allergen Reactions  . Taxol [Paclitaxel] Shortness Of Breath  . Adhesive [Tape] Rash  . Ultram [Tramadol Hcl] Rash    Current Outpatient Prescriptions  Medication Sig Dispense Refill  . Alum & Mag Hydroxide-Simeth (MAGIC MOUTHWASH) SOLN Take 5 mLs by mouth 3 (three) times daily as needed for mouth pain.  120 mL  0   . antiseptic oral rinse (BIOTENE) LIQD 15 mLs by Mouth Rinse route as needed for dry mouth.      . captopril-hydrochlorothiazide (CAPOZIDE) 25-25 MG per tablet Take 1 tablet by mouth 2 (two) times daily.  60 tablet  5  . carvedilol (COREG) 6.25 MG tablet Take 1 tablet (6.25 mg total) by mouth 2 (two) times daily.  180 tablet  3  . cyclobenzaprine (FLEXERIL) 10 MG tablet Take 10 mg by mouth at bedtime.      . DULoxetine (CYMBALTA) 60 MG capsule Take 60 mg by mouth 2 (two) times daily.      Marland Kitchen levETIRAcetam (KEPPRA) 1000 MG tablet Take 3,000 mg by mouth 2 (two) times daily.      Marland Kitchen LYRICA 150 MG capsule Take 150 mg by mouth 2 (two) times daily.       Marland Kitchen omeprazole (PRILOSEC) 40 MG capsule Take  1 capsule (40 mg total) by mouth daily.  45 capsule  2  . prochlorperazine (COMPAZINE) 10 MG tablet Take 1 tablet (10 mg total) by mouth every 6 (six) hours as needed for nausea or vomiting.  30 tablet  0  . ranitidine (ZANTAC) 150 MG tablet Take 150 mg by mouth at bedtime.       . clonazePAM (KLONOPIN) 1 MG disintegrating tablet Take 2 mg by mouth as needed for seizure.       . fluticasone (FLONASE) 50 MCG/ACT nasal spray Place 2 sprays into both nostrils daily as needed for allergies or rhinitis.      . LamoTRIgine 300 MG TB24 Take 1 tablet by mouth 2 (two) times daily. Can not be increased due to hallucinations      . lidocaine (XYLOCAINE) 2 % solution Take 20 mLs by mouth as needed. Pt states she uses after a seizure.      Marland Kitchen morphine (MS CONTIN) 30 MG 12 hr tablet Take 1 tablet (30 mg total) by mouth every 12 (twelve) hours.  60 tablet  0  . morphine (MSIR) 15 MG tablet Take 1 tablet (15 mg total) by mouth every 4 (four) hours as needed for severe pain.  120 tablet  0  . nystatin (MYCOSTATIN/NYSTOP) 100000 UNIT/GM POWD Apply 1 g topically 2 (two) times daily.  60 g  0   No current facility-administered medications for this visit.   Facility-Administered Medications Ordered in Other Visits  Medication Dose  Route Frequency Provider Last Rate Last Dose  . heparin lock flush 100 unit/mL  500 Units Intravenous Once Eston Esters, MD      . sodium chloride 0.9 % injection 10 mL  10 mL Intravenous PRN Eston Esters, MD      . sodium chloride 0.9 % injection 10 mL  10 mL Intravenous PRN Chauncey Cruel, MD   10 mL at 10/18/13 1434    OBJECTIVE: Young white woman walking with a walker Filed Vitals:   09/18/14 1309  BP: 115/64  Pulse: 87  Temp: 98.7 F (37.1 C)  Resp: 18     Body mass index is 43 kg/(m^2).      ECOG FS: 3 - Symptomatic, >50% confined to bed  Sclerae unicteric, pupils round and equal Oropharynx clear, teeth in good repair No cervical or supraclavicular adenopathy Lungs no rales or rhonchi, fair excursion bilaterally Heart regular rate and rhythm Abd soft, obese, nontender, positive bowel sounds MSK diffuse tenderness to mild palpation over the spine Neuro: nonfocal, alert and oriented x3, pleasant affect Breasts:    LAB RESULTS: Lab Results  Component Value Date   WBC 9.6 08/22/2014   NEUTROABS 8.7* 04/18/2014   HGB 13.2 08/22/2014   HCT 41.6 08/22/2014   MCV 85.8 08/22/2014   PLT 311 08/22/2014      Chemistry      Component Value Date/Time   NA 142 04/18/2014 1101   NA 139 02/13/2014 1002   K 3.4* 04/18/2014 1101   K 3.8 02/13/2014 1002   CL 96 02/13/2014 1002   CL 105 07/27/2012 1300   CO2 29 04/18/2014 1101   CO2 30 02/13/2014 1002   BUN 7.7 04/18/2014 1101   BUN 10 02/13/2014 1002   CREATININE 0.7 04/18/2014 1101   CREATININE 0.70 02/13/2014 1002   CREATININE 0.83 01/15/2012 1331      Component Value Date/Time   CALCIUM 10.2 04/18/2014 1101   CALCIUM 10.1 02/13/2014 1002   ALKPHOS 106 04/18/2014 1101  ALKPHOS 108 01/11/2013 1440   AST 23 04/18/2014 1101   AST 15 01/11/2013 1440   ALT 34 04/18/2014 1101   ALT 19 01/11/2013 1440   BILITOT 0.23 04/18/2014 1101   BILITOT 0.2* 01/11/2013 1440       Lab Results  Component Value Date   LABCA2 23 07/27/2012    No  components found with this basename: [id number]    No results found for this basename: INR,  in the last 168 hours  Urinalysis    Component Value Date/Time   COLORURINE YELLOW 07/16/2011 1040   APPEARANCEUR CLOUDY* 07/16/2011 1040   LABSPEC 1.019 07/16/2011 1040   LABSPEC 1.015 08/01/2009 1035   PHURINE 6.0 07/16/2011 [place] 07/16/2011 1040   HGBUR TRACE* 07/16/2011 1040   BILIRUBINUR NEG 03/09/2014 [place] 07/16/2011 1040   KETONESUR TRACE* 07/16/2011 1040   PROTEINUR NEG 03/09/2014 1725   PROTEINUR NEGATIVE 07/16/2011 1040   UROBILINOGEN 0.2 03/09/2014 1725   UROBILINOGEN 0.2 07/16/2011 1040   NITRITE NEG 03/09/2014 1725   NITRITE NEGATIVE 07/16/2011 1040   LEUKOCYTESUR Negative 03/09/2014 1725    STUDIES: Patient: [place] [name], [place] [name] Collected: 08/22/2014 Client: [facility] Accession: [id number] Received: 08/22/2014 Corrie Mckusick, DO DOB: 13-Sep-1976 Age: 65 Gender: F Reported: 08/23/2014 [address] Patient Ph: [phone] MRN#: [id number] [place], [place] [address] Client Acc#: Chart: Phone: [phone] Fax: LMP: Visit#: [id number] CC: Sarajane Jews Magrinat CYTOPATHOLOGY REPORT Adequacy Reason Satisfactory For Evaluation. Diagnosis BONE, FINE NEEDLE ASPIRATION, RIGHT ILIAC (SPECIMEN 1 OF 1 COLLECTED 08/22/14): MALIGNANT CELLS CONSISTENT WITH METASTATIC ADENOCARCINOMA WITH EXTRACELLULAR MUCIN. Aldona Bar MD Pathologist, Electronic Signature (Case signed 08/23/2014)  Mr Cervical Spine W Wo Contrast  08/13/2014   CLINICAL DATA:  Neck pain. Breast cancer. Abnormal CT cervical spine  EXAM: MRI CERVICAL SPINE WITHOUT AND WITH CONTRAST  TECHNIQUE: Multiplanar and multiecho pulse sequences of the cervical spine, to include the craniocervical junction and cervicothoracic junction, were obtained according to standard protocol without and with intravenous contrast.  CONTRAST:  45m MULTIHANCE GADOBENATE DIMEGLUMINE 529 MG/ML IV SOLN  COMPARISON:  CT  08/07/2014  FINDINGS: Image quality degraded by motion.  Abnormality in the bone at C2 posteriorly and on the left compatible with metastatic disease as noted on CT. No tumor extension into the canal. No cord compression.  Bone marrow tumor at T2 present. This is a lytic lesion on CT. No tumor in the canal.  Negative for fracture.  Normal alignment with mild kyphosis  C2-3:  Negative disc space  C3-4:  Mild spondylosis without spinal stenosis  C4-5:  Negative  C5-6: Disc degeneration and spondylosis with central osteophyte and mild spinal stenosis. Mild foraminal stenosis bilaterally  C6-7: Left foraminal narrowing due to spurring  C7-T1:  Negative  IMPRESSION: Metastatic disease posterior elements at C2 involving the spinous processes and left-sided lamina and facet. Review of the CT reveals a pathologic fracture through the spinous process of C2.  Metastatic disease T2 vertebral body without fracture.  Cervical spondylosis most notably at C5-6.   Electronically Signed   By: CFranchot GalloM.D.   On: 08/13/2014 18:05   Mr Thoracic Spine W Wo Contrast  08/14/2014   CLINICAL DATA:  Neck and back pain.  Frequent falls.  Breast cancer.  EXAM: MRI THORACIC SPINE WITHOUT AND WITH CONTRAST  TECHNIQUE: Multiplanar and multiecho pulse sequences of the thoracic spine were obtained without and with intravenous contrast.  CONTRAST:  20 mL MultiHance  COMPARISON:  PET-CT  08/11/2014  FINDINGS: Images are mildly degraded by motion artifact.  Vertebral alignment is normal. Vertebral body heights are preserved without compression fracture. T2 hyperintense, enhancing lesions measure 1.3 cm in the T2 vertebral body, 2.3 cm in the T8 vertebral body, and 2.1 cm in the T9 vertebral body. The T8 lesion appears to disrupt the right lateral vertebral body cortex with slight extension into the right-sided paravertebral soft tissues. There also appears to be some disruption of the right lateral T9 vertebral body cortex by the lesion in  that vertebral body. No epidural tumor is identified.  Small central disc protrusion at T2-3 and small right central disc protrusion at T6-7 do not result in spinal canal stenosis. No neural foraminal stenosis is identified. Thoracic spinal cord is normal in caliber and signal allowing for motion artifact. Incidental note is made of an azygos fissure.  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 No epidural tumor identified.   Electronically Signed   By: Logan Bores   On: 08/14/2014 08:10   Nm Pet Image Restag (ps) Skull Base To Thigh  08/11/2014   CLINICAL DATA:  Subsequent treatment strategy for breast cancer.  EXAM: NUCLEAR MEDICINE PET SKULL BASE TO THIGH  TECHNIQUE: 13.1 mCi F-18 FDG was injected intravenously. Full-ring PET imaging was performed from the skull base to thigh after the radiotracer. CT data was obtained and used for attenuation correction and anatomic localization.  FASTING BLOOD GLUCOSE:  Value: 102 mg/dl  COMPARISON:  Cervical spine CT from 08/07/2014. Abdomen and pelvis CT from 06/11/2009. Chest CT from 04/20/2009.  FINDINGS: NECK  No hypermetabolic lymph nodes in the neck.  CHEST  Scattered areas of hypermetabolic irregular pleural thickening are identified. Anterior right upper lobe index pleural irregularity (image 60 series 4) has SUV max = 5. Other scattered similar areas are seen bilaterally.  Hypermetabolic left hilar lymphadenopathy is evident. Index, well-discriminated lymph node in the anterior left hilum (image 65 series 4) has SUV max = 4.3.  Scattered irregular bilateral pulmonary nodules are hypermetabolic. Index nodule seen in the left upper lobe on image 64 of series 4 is hypermetabolic with SUV max = 3.8. A cluster of right lower lobe nodules (image 79 series 4) demonstrates SUV max = 2.4.  ABDOMEN/PELVIS  2.7 x 2.5 cm hepatoduodenal ligament lymph node (image 110 series 4) is hypermetabolic with SUV max = 6.3. No other areas of suspicious soft  tissue hypermetabolism are seen in the abdomen or pelvis.  SKELETON  The lytic T2 lesion seen on recent cervical spine CT is hypermetabolic with SUV max = 5.4. The lytic lesion at T2 is also hypermetabolic with SUV max = 3.5. 1.9 cm lytic lesion in the T9 vertebral body is hypermetabolic with SUV max = 3.9. Index lytic lesion in the posterior right iliac crest has SUV max = 3.7. The tiny hypermetabolic focus in the clivus is associated with numerous other scattered foci of hypermetabolism in the the rectal lumbar spine and bony pelvis suspicious for marrow involvement although no associated abnormality can be identified on CT imaging.  IMPRESSION: Hypermetabolic mediastinal and left hilar lymphadenopathy is associated with scattered areas of hypermetabolic irregular bilateral pleural thickening and bilateral hypermetabolic irregular pulmonary nodules. The uptake in these regions is higher than would be expected for an infectious/inflammatory etiology, making these regions concerning for metastatic involvement.  2.7 cm markedly hypermetabolic hepatoduodenal ligament lymph node in the abdomen, consistent with metastatic disease.  Lytic, hypermetabolic lesions at C2, T2, T9,  and right iliac crest, consistent with metastatic disease. Other scattered foci of hypermetabolism within the marrow show no associated CT abnormality, but also likely reflect metastatic marrow involvement.   Electronically Signed   By: Misty Stanley M.D.   On: 08/11/2014 16:35   Ct Biopsy  08/22/2014   CLINICAL DATA:  38 year old female with a history of breast carcinoma. She now has evidence of metastatic disease with FDG avid lesions on PET-CT. She is referred for targeted biopsy of right iliac crest FDG avid lesion.  EXAM: CT GUIDED fine needle biopsy and core BIOPSY OF right iliac crest lesion  ANESTHESIA/SEDATION: 2  mg IV Versed; 100 mcg IV Fentanyl  Total Moderate Sedation Time: 20 minutes.  PROCEDURE: The procedure risks, benefits, and  alternatives were explained to the patient. Questions regarding the procedure were encouraged and answered. The patient understands and consents to the procedure.  A scout CT of the pelvis was acquired for surgical planning purposes. Once we determine a safe approach to the right iliac crest lesion, a fiducial marker was placed on the patient's skin, and another limited CT image was acquired.  The posterior skin overlying the lesion was prepped with Betadinein a sterile fashion, and a sterile drape was applied covering the operative field. A sterile gown and sterile gloves were used for the procedure. Local anesthesia was provided with 1% Lidocaine. A spinal needle was advanced to the posterior cortex the right iliac bone with a final image acquired for location.  A small stab incision was then made with an 11 blade scalpel, and a 15 cm, 11 gauge Murphy needle was advanced to the posterior cortex of the right iliac bone. We confirmed position with a CT image. The needle was then advanced with manual forced into the posterior cortex and to the margin of the target lesion. Again, the tip of the needle was confirmed within the lesion after withdrawing the right sharp stylet from within the trocar.  We then sample the lesion with for separate 20 gauge Francine needles, 2 separate 18 gauge core biopsy, and then a final 11 gauge core biopsy with the [facility] needle. All of the tissue specimen were passed to a cytotechnologist in the room for slide preparation and transportation of pathology lab.  Upon withdrawal of the Murphy needle, a sterile bandage was placed.  The patient tolerated the procedure well and remained hemodynamically stable throughout.  No complications were encountered and no significant blood loss encounter.  Complications: None  FINDINGS: Lytic lesion within the right posterior iliac bone, similar to comparison PET-CT.  Images during the case demonstrate placement of 11 gauge Murphy needle at the margin  of the lesion. All samples were completed through the [facility] needle, with the exception of the final 11 gauge core biopsy, which was acquired with the [name] needle itself.  IMPRESSION: Status post CT-guided biopsy of right posterior iliac bone lesion, with tissue specimen sent to pathology for complete histopathologic analysis.  Signed,  Dulcy Fanny. Earleen Newport, DO  [facility]  [facility]   Electronically Signed   By: Corrie Mckusick O.D.   On: 08/22/2014 12:45     ASSESSMENT: [name] is a  38 y.o.  [place], [place] woman:  (1) Status post left lumpectomy 04/02/2009 for a pT1c NX invasive ductal carcinoma, grade 3, estrogen and progesterone receptor positive, HER-2 not amplified, with positive margins  (2) Status post left modified radical mastectomy 04/09/2009 showing no residual tumor in the breast, but 1/26  lymph nodes involved, for final stage of pT1c pN1, stage IIA; status post subsequent right mastectomy with benign pathology  (3) Treated adjuvantly according to the ECOG E5103 study, with 4 cycles of dose dense doxorubicin and cyclophosphamide, followed by 10 doses of weekly paclitaxel, all given with bevacizumab or placebo.  (4) Completed postmastectomy radiation to the left chest wall and left supraclavicular fossa 12/07/2009  (5) Received Tamoxifen from 01/2010 until 01/2011  (6) Status post TAH-BSO 02/17/2011 with benign pathology  (7) Started anastrozole in 03/2011; discontinued September 2015 with a diagnosis of metastatic disease.  (8) Genetic counseling requested 04/18/2014  METASTATIC DISEASE: (9) metastatic recurrence involving mediastinal and left hilar lymph nodes, bilateral pleural thickening, bilateral pulmonary nodules, the hepatoduodenal ligament lymph nodes, and numerous bony lesions including C2 T2-T8 and T9 noted on scans September 2015; right iliac bone marrow biopsy 08/22/2014 confirms metastatic adenocarcinoma with  extracellular mucin, estrogen receptor positive  (10) patient refuses life-prolonging treatment; hospice referral place 08/24/2014  (a) morphine Contin 15 mg twice a day with MSIR 15 mg 3 times a day when necessary started 08/25/2014, changed to 35m MS contin BID/ MSIR 181mq3-4hrs 09/18/14  (b) bowel prophylaxis with daily MiraLAX and stool softeners, 2 tablets twice daily, started 08/25/2014   PLAN: Overall, Arnetia is satisfied with her care while on hospice, and believes that her needs are being appropriately addressed. There is room for improvement with her pain control. Per Dr. MaJana Hakime are increasing her MS contin to 3061mID to hopefully bring her pain down to around 3/10, the new goal. She will continue the 84m18mIR, increasing the dose to every 3 hrs if needed. She was explained that she is unlikely to overdose at these doses and was encouraged to treat her pain honestly. She will continue on the bowel regimen she is on per the hospice nurse to have soft bowel movements, however frequent.   Afrika will return in 2 months for a follow up visit with Dr. MagrJana Hakime understands and agrees with this plan. She knows the goal of treatment in her case is comfort. She has been encouraged to call with any issues that might arise before her next visit here.  FerrMarcelino Duster  09/18/2014   5:30 PM   ADDENDUM: NichDarden Amberually looks better than the last time I saw her. We do see this in hospice referrals: Concentrating on quality of life and maintaining functionality actually can prolong survival, as compared to aggressive chemotherapy which is generally ineffectual but only adds complications and side effects.   Josette has a good understanding of her situation. She does not appear better, anxious, or depressed. She wanted to know "how long she had" and of course we do not know that, but I have previously told her that I think she has a 50-50 chance of living less than 6 months.  I  usually see hospice patients on an every 2 month basis and I have set her up for a return visit here at that time. She knows that we will be glad to intervene in any way that would be helpful.    Malanie has advanced directives in place and should not be resuscitated in case of a terminal event.   I personally saw this patient and performed a substantive portion of this encounter with the listed APP documented above.   MAGRChauncey Cruel

## 2014-09-19 ENCOUNTER — Ambulatory Visit: Payer: Medicare Other | Admitting: Physical Therapy

## 2014-09-19 ENCOUNTER — Encounter: Payer: Self-pay | Admitting: Family Medicine

## 2014-09-19 DIAGNOSIS — C50919 Malignant neoplasm of unspecified site of unspecified female breast: Secondary | ICD-10-CM

## 2014-09-19 MED ORDER — MORPHINE SULFATE ER 30 MG PO TBCR: 30.0000 mg | EXTENDED_RELEASE_TABLET | Freq: Two times a day (BID) | ORAL | Status: DC

## 2014-09-19 MED ORDER — MORPHINE SULFATE 15 MG PO TABS: 15.0000 mg | ORAL_TABLET | ORAL | Status: DC | PRN

## 2014-09-19 NOTE — Progress Notes (Signed)
MyChart message

## 2014-10-02 ENCOUNTER — Other Ambulatory Visit: Payer: Self-pay | Admitting: *Deleted

## 2014-10-02 ENCOUNTER — Ambulatory Visit: Payer: Self-pay | Admitting: Neurology

## 2014-10-02 MED ORDER — FLUCONAZOLE 100 MG PO TABS: 100.0000 mg | ORAL_TABLET | Freq: Every day | ORAL | Status: DC

## 2014-10-02 MED ORDER — AMOXICILLIN-POT CLAVULANATE 875-125 MG PO TABS: 1.0000 | ORAL_TABLET | Freq: Two times a day (BID) | ORAL | Status: DC

## 2014-10-02 NOTE — Telephone Encounter (Signed)
This RN was contacted by Rosanne Ashing RN with [facility]  Rosanne Ashing is at pt's home now and is calling with new concern.  Pt has an upper respiratory infection with "green phlegm from her nose and coughing up yellow sputum "  " she has a sore throat with noted white pustules at the back of her throat "  " I am pretty sure she has strep "  Rosanne Ashing states pt's usual medication regimen with above is Augmentin which then causes a yeast infection.  Pt is currently not running fevers.  Per review with MD- order given for Augmentin and diflucan.  This RN called above to [facility] and prescriptions sent to pt's pharmacy.

## 2014-10-13 ENCOUNTER — Other Ambulatory Visit: Payer: Self-pay | Admitting: *Deleted

## 2014-10-13 MED ORDER — MORPHINE SULFATE ER 30 MG PO TBCR: 60.0000 mg | EXTENDED_RELEASE_TABLET | Freq: Two times a day (BID) | ORAL | Status: DC

## 2014-10-13 MED ORDER — MORPHINE SULFATE 15 MG PO TABS: 15.0000 mg | ORAL_TABLET | ORAL | Status: DC | PRN

## 2014-10-13 NOTE — Telephone Encounter (Signed)
Per call with [facility]- need for pain medication changes per baseline pain maintained at 6 out of 1-10 scale.  Pt reports pain excerbations up to a 9 and is using up to 8 tabs a day of the MSIR 15mg .  MS contin 30 mg is bid.  Per review with MD recommendation given for MS Contin to be increased to 60mg  bid and continue use of MSIR 1-2 tabs q 3 hours prn.  Above discussed with Rosanne Ashing - prescriptions obtained and faxed to pharmacy.

## 2014-10-17 ENCOUNTER — Telehealth: Payer: Self-pay | Admitting: *Deleted

## 2014-10-17 ENCOUNTER — Encounter: Payer: Self-pay | Admitting: *Deleted

## 2014-10-17 NOTE — Telephone Encounter (Signed)
Received call from Olin, RN [facility] wanting to know when pt's portacath was flushed.  Teresse also would like to know if Dr. Jana Hakim would give order for port to be flushed at home by hospice nurse.  Informed Teresse that would be ok for hospice nurse to maintain port flush at home as per protocol.  Val, desk nurse for Dr. Jana Hakim notified.  OK for verbal order as per desk nurse. Teresse  Phone     [phone].

## 2014-10-25 ENCOUNTER — Telehealth: Payer: Self-pay | Admitting: *Deleted

## 2014-10-25 MED ORDER — SCOPOLAMINE 1 MG/3DAYS TD PT72: 1.0000 | MEDICATED_PATCH | TRANSDERMAL | Status: DC

## 2014-10-25 NOTE — Telephone Encounter (Signed)
Per MD review of noted request per hospice nurse stating ongoing congestion with yellow to dark brown sputum- without fevers or chills- recommendation is for scopolamine patches.  Prescription sent to pharmacy and hospice RN made aware.

## 2014-10-31 ENCOUNTER — Ambulatory Visit (INDEPENDENT_AMBULATORY_CARE_PROVIDER_SITE_OTHER): Admitting: Cardiovascular Disease

## 2014-10-31 ENCOUNTER — Encounter: Payer: Self-pay | Admitting: Cardiovascular Disease

## 2014-10-31 VITALS — BP 132/64 | HR 87 | Ht 65.0 in | Wt 196.8 lb

## 2014-10-31 DIAGNOSIS — R000 Tachycardia, unspecified: Secondary | ICD-10-CM

## 2014-10-31 DIAGNOSIS — R0600 Dyspnea, unspecified: Secondary | ICD-10-CM

## 2014-10-31 DIAGNOSIS — I159 Secondary hypertension, unspecified: Secondary | ICD-10-CM

## 2014-10-31 DIAGNOSIS — R0602 Shortness of breath: Secondary | ICD-10-CM

## 2014-10-31 NOTE — Progress Notes (Signed)
HPI  Madison [name] is a 38 y/o woman who is here today for a follow-up visit regarding  tachycardia, dyspnea and hypertension. She has known history of breast CA, morbid obesity, anxiety/depession, seizure d/o, fibromyalgia and ongoing tobacco use.  She was seen by Dr. Haroldine Laws most recently in 2012 for dyspnea. Echocardiogram showed EF 55% with no regional wall motion abnormalities. BNP normal. She underwent chemotherapy and left radical mastectomy for breast cancer. Has had f/u imaging studies during chemo and last echo 6/11 EF 65%.  Previously underwent EP procedure by Dr. 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 Post procedure c/b venous clot. On coumadin x 3 months.  She reports developing hypertension as a result of treatment with Avastin. She was seen in June for worsening dyspnea and tachycardia. She has chronic chest pain related to costochondritis. I proceeded with an echocardiogram in June which showed an ejection fraction of 60-65% with very mild pulmonary hypertension. Estimated systolic pulmonary pressure was 40 mmHg. TSH was normal. During last visit, I switched her from amlodipine to carvedilol given resting tachycardia. Unfortunately, she now has metastatic breast cancer. She is under hospice care. She is not getting any active treatment for cancer.    Allergies  Allergen Reactions  . Taxol [Paclitaxel] Shortness Of Breath  . Adhesive [Tape] Rash  . Ultram [Tramadol Hcl] Rash     Current Outpatient Prescriptions on File Prior to Visit  Medication Sig Dispense Refill  . Alum & Mag Hydroxide-Simeth (MAGIC MOUTHWASH) SOLN Take 5 mLs by mouth 3 (three) times daily as needed for mouth pain. 120 mL 0  . amoxicillin-clavulanate (AUGMENTIN) 875-125 MG per tablet Take 1 tablet by mouth 2 (two) times daily. 14 tablet 0  . antiseptic oral rinse (BIOTENE) LIQD 15 mLs by Mouth Rinse route as needed for dry mouth.    . captopril-hydrochlorothiazide (CAPOZIDE) 25-25 MG per  tablet Take 1 tablet by mouth 2 (two) times daily. 60 tablet 5  . carvedilol (COREG) 6.25 MG tablet Take 1 tablet (6.25 mg total) by mouth 2 (two) times daily. 180 tablet 3  . clonazePAM (KLONOPIN) 1 MG disintegrating tablet Take 2 mg by mouth as needed for seizure.     . cyclobenzaprine (FLEXERIL) 10 MG tablet Take 10 mg by mouth at bedtime.    . DULoxetine (CYMBALTA) 60 MG capsule Take 60 mg by mouth 2 (two) times daily.    . fluconazole (DIFLUCAN) 100 MG tablet Take 1 tablet (100 mg total) by mouth daily. 10 tablet 0  . fluticasone (FLONASE) 50 MCG/ACT nasal spray Place 2 sprays into both nostrils daily as needed for allergies or rhinitis.    . LamoTRIgine 300 MG TB24 Take 1 tablet by mouth 2 (two) times daily. Can not be increased due to hallucinations    . levETIRAcetam (KEPPRA) 1000 MG tablet Take 3,000 mg by mouth 2 (two) times daily.    Marland Kitchen lidocaine (XYLOCAINE) 2 % solution Take 20 mLs by mouth as needed. Pt states she uses after a seizure.    Marland Kitchen LYRICA 150 MG capsule Take 150 mg by mouth 2 (two) times daily.     Marland Kitchen morphine (MS CONTIN) 30 MG 12 hr tablet Take 2 tablets (60 mg total) by mouth every 12 (twelve) hours. 120 tablet 0  . morphine (MSIR) 15 MG tablet Take 1-2 tablets (15-30 mg total) by mouth every 3 (three) hours as needed for severe pain. 120 tablet 0  . nystatin (MYCOSTATIN/NYSTOP) 100000 UNIT/GM POWD Apply 1 g topically  2 (two) times daily. 60 g 0  . omeprazole (PRILOSEC) 40 MG capsule Take 1 capsule (40 mg total) by mouth daily. 45 capsule 2  . prochlorperazine (COMPAZINE) 10 MG tablet Take 1 tablet (10 mg total) by mouth every 6 (six) hours as needed for nausea or vomiting. 30 tablet 0  . ranitidine (ZANTAC) 150 MG tablet Take 150 mg by mouth at bedtime.     Marland Kitchen scopolamine (TRANSDERM-SCOP) 1 MG/3DAYS Place 1 patch (1.5 mg total) onto the skin every 3 (three) days. 5 patch 0   Current Facility-Administered Medications on File Prior to Visit  Medication Dose Route Frequency  Provider Last Rate Last Dose  . heparin lock flush 100 unit/mL  500 Units Intravenous Once Eston Esters, MD      . sodium chloride 0.9 % injection 10 mL  10 mL Intravenous PRN Eston Esters, MD      . sodium chloride 0.9 % injection 10 mL  10 mL Intravenous PRN Chauncey Cruel, MD   10 mL at 10/18/13 1434     Past Medical History  Diagnosis Date  . Tobacco user   . Grand mal seizure   . Fibromyalgia   . Seizures   . Breast CA   . GERD (gastroesophageal reflux disease)   . Depression   . Arthritis   . Allergy   . Hypertension      Past Surgical History  Procedure Laterality Date  . Appendectomy    . Tonsillectomy    . Tubal ligation    . Mastectomy      bilateral  . Cholecystectomy  March 2011  . Abdominal hysterectomy  March 2012  . Breast surgery      bilateral mastectomy  . Port-a-cath insertion       Family History  Problem Relation Age of Onset  . Diabetes Sister   . Hypertension Maternal Grandfather      History   Social History  . Marital Status: Single    Spouse Name: N/A    Number of Children: N/A  . Years of Education: N/A   Occupational History  . Not on file.   Social History Main Topics  . Smoking status: Current Every Day Smoker -- 1.00 packs/day for 25 years    Types: Cigarettes  . Smokeless tobacco: Never Used     Comment: 1 ppd for 20 years   . Alcohol Use: No  . Drug Use: No  . Sexual Activity: Not Currently    Birth Control/ Protection: Surgical   Other Topics Concern  . Not on file   Social History Narrative   Living w parents in [place] due to cancer and inability to take car for herself or others. Has 3 children. 73 yo has bipolar and MR, currently in foster home. Other 2 live with her. Divorced.      ROS A 10 point review of system was performed. It is negative other than that mentioned in the history of present illness.   PHYSICAL EXAM   There were no vitals taken for this visit. Constitutional: She is oriented to  person, place, and time. She appears well-developed and well-nourished. No distress.  HENT: No nasal discharge.  Head: Normocephalic and atraumatic.  Eyes: Pupils are equal and round. No discharge.  Neck: Normal range of motion. Neck supple. No JVD present. No thyromegaly present.  Cardiovascular: Tachycardic, regular rhythm, normal heart sounds. Exam reveals no gallop and no friction rub. No murmur heard.  Pulmonary/Chest: Effort normal and  breath sounds normal. No stridor. No respiratory distress. She has no wheezes. She has no rales. She exhibits no tenderness.  Abdominal: Soft. Bowel sounds are normal. She exhibits no distension. There is no tenderness. There is no rebound and no guarding.  Musculoskeletal: Normal range of motion. She exhibits no edema and no tenderness.  Neurological: She is alert and oriented to person, place, and time. Coordination normal.  Skin: Skin is warm and dry. No rash noted. She is not diaphoretic. No erythema. No pallor.  Psychiatric: She has a normal mood and affect. Her behavior is normal. Judgment and thought content normal.     EKG: Normal sinus rhythm with borderline prolonged QT interval   ASSESSMENT AND PLAN

## 2014-10-31 NOTE — Assessment & Plan Note (Signed)
This has improved significantly after switching amlodipine to carvedilol. Thyroid function was normal.

## 2014-10-31 NOTE — Assessment & Plan Note (Signed)
Likely multifactorial due to physical deconditioning and diastolic heart failure. Unfortunately, she now has metastatic breast cancer. She can follow-up with me as needed.

## 2014-10-31 NOTE — Assessment & Plan Note (Signed)
Blood pressure is well controlled on current medications. 

## 2014-10-31 NOTE — Patient Instructions (Signed)
Continue same medications.   Follow up as needed.  

## 2014-11-06 ENCOUNTER — Other Ambulatory Visit: Payer: Self-pay | Admitting: *Deleted

## 2014-11-06 MED ORDER — MORPHINE SULFATE 15 MG PO TABS: 15.0000 mg | ORAL_TABLET | ORAL | Status: DC | PRN

## 2014-11-06 MED ORDER — MORPHINE SULFATE ER 30 MG PO TBCR
60.0000 mg | EXTENDED_RELEASE_TABLET | Freq: Three times a day (TID) | ORAL | Status: DC
Start: 2014-11-06 — End: 2014-11-21

## 2014-11-06 NOTE — Telephone Encounter (Signed)
This RN received call from [employer] with [facility].  Per message RN states noted uncontrolled pain per visit today.  Pt rates her pain a 10 out of 10 with primary discomfort in her " shoulder, neck and up into her head ".  Pt is " rocking and crying in pain "  Current pain regimen is MS Contin 60mg  bid with MSIR 15mg  1-2 tabs every 3 hours.  Pt has been using 3 tablets approximately every 3 hours today.  Return call number given as [phone].  This RN called upon retrieving message at [phone] and obtained identified message as Claudette Head with request " not to leave urgent request after 430 " Message stated to contact [phone] with urgent concerns.  This RN contacted above number which was for hospice and spoke with triage RN.  Above discussed and new prescriptions faxed to [phone].

## 2014-11-09 ENCOUNTER — Telehealth: Payer: Self-pay

## 2014-11-09 ENCOUNTER — Telehealth: Payer: Self-pay | Admitting: Neurology

## 2014-11-09 NOTE — Telephone Encounter (Signed)
Please review

## 2014-11-09 NOTE — Telephone Encounter (Signed)
Pt w/ h/o sz. In Hospice care, pt has began sleeping through much of the day. She is missing some of her PO doses of sz meds. Hospice nurse wants to consider alternate meds.   Please call Rosanne Ashing, Cb# [phone] / Sherri S.

## 2014-11-09 NOTE — Telephone Encounter (Signed)
Rosanne Ashing rn from [facility] called. She states pt is much more lethargic and sleeping a lot. She is not taking her oral meds on schedule. The family is concerned b/c she is not getting her seizure meds as prescribed. They are wondering if there is an alternate route for the meds clonazepam, keppra and lamotrigine. teressa also has a call in to Dr Ellouise Newer of [facility] neurology. I told teressa I would let Dr Jana Hakim know and to call us back if Dr Delice Lesch does not answer her call.

## 2014-11-09 NOTE — Telephone Encounter (Signed)
Started declining this week, last Monday in tears 10/10 pain, instructed to increase frequency of extended release pain med q8hr on Tuesday which she has not done, in addition to morphine sulfate prn. On Tues, per mother house was in disarray, patient out of it, not making sense but had not made any changes in meds, meds were in disarray. Then Wed, nurse refilled med box. Per patient may have had sz on Tues, unwitnessed, tongue sore. She had not yet done q8hr pain regimen, but sleeping so much. Slept through night, did not take night med.   Suggested switching to Keppra solution, otherwise wake her up each time for seizure meds. May use prn Diastat 20mg  per rectum for rescue. Teressa nurse will send prescription to our office. Family now watching her 24/7.

## 2014-11-13 ENCOUNTER — Telehealth: Payer: Self-pay | Admitting: Neurology

## 2014-11-13 ENCOUNTER — Telehealth: Payer: Self-pay | Admitting: *Deleted

## 2014-11-13 NOTE — Telephone Encounter (Signed)
Dr. Delice Lesch has spoken with Hospice nurse about patient's medication.

## 2014-11-13 NOTE — Telephone Encounter (Signed)
Madison [name] from [facility] call and would like to speak with Dr. Delice Lesch about her medications Call back number [phone]

## 2014-11-13 NOTE — Telephone Encounter (Signed)
Spoke to nurse Rosanne Ashing, patient had been totally lethargic the whole week, started waking up at end of week, now sitting up and confused, but able to take her meds.  They had called pharmacy, who suggested in the event it happens, Pb 97.2mg  tablet rectal. Discussed concern that this may cause more sedation, agreed to give 1 tablet prn if pt lethargic and unable to take oral.  Same with Lamictal, which has a disintegrating tablet form, give the disintegrating form instead of usual Lamotrigine 300mg  BID when lethargic and unable to take PO.

## 2014-11-21 ENCOUNTER — Ambulatory Visit (HOSPITAL_BASED_OUTPATIENT_CLINIC_OR_DEPARTMENT_OTHER): Admitting: Oncology

## 2014-11-21 ENCOUNTER — Telehealth: Payer: Self-pay | Admitting: Oncology

## 2014-11-21 VITALS — BP 116/61 | HR 79 | Temp 98.6°F | Resp 18 | Ht 65.0 in | Wt 253.4 lb

## 2014-11-21 DIAGNOSIS — K5909 Other constipation: Secondary | ICD-10-CM

## 2014-11-21 DIAGNOSIS — Z170 Estrogen receptor positive status [ER+]: Secondary | ICD-10-CM

## 2014-11-21 DIAGNOSIS — K5900 Constipation, unspecified: Secondary | ICD-10-CM | POA: Insufficient documentation

## 2014-11-21 DIAGNOSIS — C50912 Malignant neoplasm of unspecified site of left female breast: Secondary | ICD-10-CM

## 2014-11-21 DIAGNOSIS — C771 Secondary and unspecified malignant neoplasm of intrathoracic lymph nodes: Secondary | ICD-10-CM

## 2014-11-21 DIAGNOSIS — R569 Unspecified convulsions: Secondary | ICD-10-CM

## 2014-11-21 DIAGNOSIS — C50919 Malignant neoplasm of unspecified site of unspecified female breast: Secondary | ICD-10-CM

## 2014-11-21 DIAGNOSIS — G8929 Other chronic pain: Secondary | ICD-10-CM | POA: Insufficient documentation

## 2014-11-21 DIAGNOSIS — C50411 Malignant neoplasm of upper-outer quadrant of right female breast: Secondary | ICD-10-CM

## 2014-11-21 DIAGNOSIS — C7951 Secondary malignant neoplasm of bone: Secondary | ICD-10-CM

## 2014-11-21 MED ORDER — MORPHINE SULFATE 15 MG PO TABS: 15.0000 mg | ORAL_TABLET | ORAL | Status: DC | PRN

## 2014-11-21 MED ORDER — MORPHINE SULFATE ER 60 MG PO TBCR: EXTENDED_RELEASE_TABLET | ORAL | Status: DC

## 2014-11-21 NOTE — Telephone Encounter (Signed)
, °

## 2014-11-21 NOTE — Progress Notes (Signed)
Madison [name]  [phone]) [phone] Fax:([phone]) [phone]  OFFICE PROGRESS NOTE     ID: Madison [name]   DOB: [birth year]  MR#: [id number]  PTW#:[id number]   PCP: Madison Slipper, MD GYN: Madison [name] SU: Madison [name] OTHER MD: Madison [name], Madison [name], Madison [name]  CHIEF COMPLAINT: Recurrent/metastatic breast cancer CURRENT TREATMENT: Hospice care at home  HISTORY OF PRESENT ILLNESS: From Dr. Collier Salina [name]'s new patient evaluation note dated 04/05/2009:  "This woman is here today with her mother and mother's friend.  She apparently was noted to have a palpable mass in the left breast at about the 3 o'clock position back in October.  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  She had a 65-monthfollowup mammogram.  In March of this year, she began experiencing pain in her breast.  [facility].  She had a mammogram on 02/28/2009.  Physical exam at that time showed a palpable superficial subareolar mass extending from the 12 to 3 o'clock position measuring 2 cm.  On physical exam, ultrasound showed this to be about a 2.4 x 2 x 1.4 cm mass with mild posterior acoustic enhancement.  This was a lobular and hypoechoic mass felt to be consistent with a fibroadenoma but malignancy cannot be ruled out.  Of note is that the mass previously measured 1.7 x 1.4 x 1.1 cm back in October.  No abscess was seen.  No axillary adenopathy was seen.  The patient was referred to Dr. SMargot [name]  Because of the feeling this represented a fibroadenoma, an excision was planned and performed on 04/02/2009.  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  Margins were positive for tumor.  Lymphovascular invasion was not identified.  Prior to surgery and there was a concern about an  additional lesion seen.  Additional breast tissue removed from the primary surgery showed the fibrocystic changes and intraductal papilloma.  No other evidence of malignancy.  This tumor was noted to be ER and PR positive.  Special stains were also performed and apparently this was negative for chromogranin and minimal staining for synaptophysin.  Total dimensions of this mass could not be accurately assessed.  It was felt to be about 1.5 cm.  Because of some trabecular and organoid appearance of this lesion in some of the sections, special stains were performed and the tumor was felt to be high grade.  The patient has had an unremarkable postoperative course."    Her subsequent history is as detailed below.   INTERVAL HISTORY: NRamandeepreturns today for follow up of her metastatic breast cancer, accompanied by her mother and aunt..She continues at home w Hospice help. She had an episode a few weeks back when she apparently had a seizure, took too many medications, and basically was found dishevelled and confused by her family. They are now staying with her 24/7 and giving her the meds instead of her taking them as before. This is working much better: her pain is better controlled, she is more alert, and we have a better record of what she is actually taking  REVIEW OF SYSTEMS: Her pain remains a 6-7 most of the time. She takes 60 mg MorphineContine TID, then breakthrough 30 mg IR 4-6 times a dya. About half the days this is more than QID. She is  still significantly constipated. She may have a BM once or twice a week, and it's hard. Denies bloord per rectum. C/) severe fatigue, pain form the head to her legs, described as throbbing, achy and cramping. She has blurred vision, ringing in her ears, a runny nose, difficulty swallowing, swollen feet, SOB even at rest, N, V, heartburn, urinary lkeakage, difficulties walking, forgetfulness, anxiety, phobias and hot flashes. A detailed ROS today was otherwise  stable   PAST MEDICAL HISTORY: Past Medical History  Diagnosis Date  . Tobacco user   . Grand mal seizure   . Fibromyalgia   . Seizures   . GERD (gastroesophageal reflux disease)   . Depression   . Arthritis   . Allergy   . Hypertension   . Breast CA     PAST SURGICAL HISTORY: Past Surgical History  Procedure Laterality Date  . Appendectomy    . Tonsillectomy    . Tubal ligation    . Mastectomy      bilateral  . Cholecystectomy  March 2011  . Abdominal hysterectomy  March 2012  . Breast surgery      bilateral mastectomy  . Port-a-cath insertion      FAMILY HISTORY Family History  Problem Relation Age of Onset  . Diabetes Sister   . Hypertension Maternal Grandfather   Both parents alive, but divorced  Her mother is the Chiropodist at [employer].  She has a sister and brother both of whom are alive and well in good health.  GYNECOLOGIC HISTORY: She is GX, P2, menarche age 51.  She underwent total abdominal hysterectomy with bilateral salpingo-oophorectomy in 01/2011.  SOCIAL HISTORY: She is separated.  Originally from [place], went to [employer] and subsequently went to [employer]. She is disabled secondary to her fibromyalgia and seizure disorder..  Her daughter Madison [name] (pronounced "Raquel Sarna") is currently 34, and lives with the patient. Son Madison [name], 21, is a special needs child and lives in a group home.   ADVANCED DIRECTIVES: In place. The patient has a living will but requests no extraordinary support in case of a terminal event. The patient's healthcare power of attorney is her mother, Madison [name], cell number [phone]   HEALTH MAINTENANCE: History  Substance Use Topics  . Smoking status: Current Every Day Smoker -- 1.00 packs/day for 25 years    Types: Cigarettes  . Smokeless tobacco: Never Used     Comment: 1 ppd for 20 years   . Alcohol Use: No     Colonoscopy: Never  PAP: s/p hysterectomy  Bone density: pending  Lipid panel:  Not on file  Allergies  Allergen Reactions  . Taxol [Paclitaxel] Shortness Of Breath  . Adhesive [Tape] Rash  . Ultram [Tramadol Hcl] Rash    Current Outpatient Prescriptions  Medication Sig Dispense Refill  . Alum & Mag Hydroxide-Simeth (MAGIC MOUTHWASH) SOLN Take 5 mLs by mouth 3 (three) times daily as needed for mouth pain. 120 mL 0  . antiseptic oral rinse (BIOTENE) LIQD 15 mLs by Mouth Rinse route as needed for dry mouth.    Marland Kitchen aspirin 81 MG tablet Take 81 mg by mouth daily.    . captopril-hydrochlorothiazide (CAPOZIDE) 25-25 MG per tablet Take 1 tablet by mouth 2 (two) times daily. 60 tablet 5  . carvedilol (COREG) 6.25 MG tablet Take 1 tablet (6.25 mg total) by mouth 2 (two) times daily. 180 tablet 3  . clonazePAM (KLONOPIN) 1 MG disintegrating tablet Take 2 mg by mouth as needed for seizure.     Marland Kitchen  cyclobenzaprine (FLEXERIL) 10 MG tablet Take 10 mg by mouth at bedtime.    . DULoxetine (CYMBALTA) 60 MG capsule Take 60 mg by mouth 2 (two) times daily.    . fluconazole (DIFLUCAN) 100 MG tablet Take 1 tablet (100 mg total) by mouth daily. 10 tablet 0  . fluticasone (FLONASE) 50 MCG/ACT nasal spray Place 2 sprays into both nostrils daily as needed for allergies or rhinitis.    . LamoTRIgine 300 MG TB24 Take 1 tablet by mouth 2 (two) times daily. Can not be increased due to hallucinations    . levETIRAcetam (KEPPRA) 1000 MG tablet Take 3,000 mg by mouth 2 (two) times daily.    Marland Kitchen lidocaine (XYLOCAINE) 2 % solution Take 20 mLs by mouth as needed. Pt states she uses after a seizure.    Marland Kitchen LYRICA 150 MG capsule Take 150 mg by mouth 2 (two) times daily.     . Melatonin 10 MG CAPS Take by mouth daily.    Marland Kitchen morphine (MS CONTIN) 60 MG 12 hr tablet Take 2 tablets (120 mgs) by mouth every 12 hours--- HOSPICE PATIENT 120 tablet 0  . morphine (MSIR) 15 MG tablet Take 1-2 tablets (15-30 mg total) by mouth every 3 (three) hours as needed for severe pain. 120 tablet 0  . nystatin (MYCOSTATIN/NYSTOP)  100000 UNIT/GM POWD Apply 1 g topically 2 (two) times daily. 60 g 0  . omeprazole (PRILOSEC) 40 MG capsule Take 1 capsule (40 mg total) by mouth daily. 45 capsule 2  . prochlorperazine (COMPAZINE) 10 MG tablet Take 1 tablet (10 mg total) by mouth every 6 (six) hours as needed for nausea or vomiting. 30 tablet 0  . ranitidine (ZANTAC) 150 MG tablet Take 150 mg by mouth at bedtime.     Marland Kitchen scopolamine (TRANSDERM-SCOP) 1 MG/3DAYS Place 1 patch (1.5 mg total) onto the skin every 3 (three) days. 5 patch 0   No current facility-administered medications for this visit.   Facility-Administered Medications Ordered in Other Visits  Medication Dose Route Frequency Provider Last Rate Last Dose  . heparin lock flush 100 unit/mL  500 Units Intravenous Once Eston Esters, MD      . sodium chloride 0.9 % injection 10 mL  10 mL Intravenous PRN Eston Esters, MD      . sodium chloride 0.9 % injection 10 mL  10 mL Intravenous PRN Chauncey Cruel, MD   10 mL at 10/18/13 1434    OBJECTIVE: Young white woman who appears chronically ill Filed Vitals:   11/21/14 1211  BP: 116/61  Pulse: 79  Temp: 98.6 F (37 C)  Resp: 18     Body mass index is 42.17 kg/(m^2).      ECOG FS: 2 - Symptomatic, <50% confined to bed  Sclerae unicteric, pupils round and reactive, EOMs intact Oropharynx clear and moist No cervical or supraclavicular adenopathy Lungs no rales or rhonchi, fair excursion bilaterally Heart regular rate and rhythm Abd soft, obese, nontender, positive bowel sounds MSK diffuse tenderness to mild palpation over the spine Neuro: nonfocal, alert and oriented x3, pleasant affect Breasts: deferred   LAB RESULTS: Lab Results  Component Value Date   WBC 9.6 08/22/2014   NEUTROABS 8.7* 04/18/2014   HGB 13.2 08/22/2014   HCT 41.6 08/22/2014   MCV 85.8 08/22/2014   PLT 311 08/22/2014      Chemistry      Component Value Date/Time   NA 142 04/18/2014 1101   NA 139 02/13/2014 1002   K  3.4* 04/18/2014  1101   K 3.8 02/13/2014 1002   CL 96 02/13/2014 1002   CL 105 07/27/2012 1300   CO2 29 04/18/2014 1101   CO2 30 02/13/2014 1002   BUN 7.7 04/18/2014 1101   BUN 10 02/13/2014 1002   CREATININE 0.7 04/18/2014 1101   CREATININE 0.70 02/13/2014 1002   CREATININE 0.83 01/15/2012 1331      Component Value Date/Time   CALCIUM 10.2 04/18/2014 1101   CALCIUM 10.1 02/13/2014 1002   ALKPHOS 106 04/18/2014 1101   ALKPHOS 108 01/11/2013 1440   AST 23 04/18/2014 1101   AST 15 01/11/2013 1440   ALT 34 04/18/2014 1101   ALT 19 01/11/2013 1440   BILITOT 0.23 04/18/2014 1101   BILITOT 0.2* 01/11/2013 1440       Lab Results  Component Value Date   LABCA2 23 07/27/2012    No components found for: ENIDP824  No results for input(s): INR in the last 168 hours.  Urinalysis    Component Value Date/Time   COLORURINE YELLOW 07/16/2011 1040   APPEARANCEUR CLOUDY* 07/16/2011 1040   LABSPEC 1.019 07/16/2011 1040   LABSPEC 1.015 08/01/2009 1035   PHURINE 6.0 07/16/2011 1040   GLUCOSEU NEGATIVE 07/16/2011 1040   HGBUR TRACE* 07/16/2011 1040   BILIRUBINUR NEG 03/09/2014 1725   BILIRUBINUR NEGATIVE 07/16/2011 1040   KETONESUR TRACE* 07/16/2011 1040   PROTEINUR NEG 03/09/2014 1725   PROTEINUR NEGATIVE 07/16/2011 1040   UROBILINOGEN 0.2 03/09/2014 1725   UROBILINOGEN 0.2 07/16/2011 1040   NITRITE NEG 03/09/2014 1725   NITRITE NEGATIVE 07/16/2011 1040   LEUKOCYTESUR Negative 03/09/2014 1725    STUDIES: No results found.   ASSESSMENT: Madison [name] is a  38 y.o.  [place], [place] woman:  (1) Status post left lumpectomy 04/02/2009 for a pT1c NX invasive ductal carcinoma, grade 3, estrogen and progesterone receptor positive, HER-2 not amplified, with positive margins  (2) Status post left modified radical mastectomy 04/09/2009 showing no residual tumor in the breast, but 1/26 lymph nodes involved, for final stage of pT1c pN1, stage IIA; status post subsequent right mastectomy with benign  pathology  (3) Treated adjuvantly according to the ECOG E5103 study, with 4 cycles of dose dense doxorubicin and cyclophosphamide, followed by 10 doses of weekly paclitaxel, all given with bevacizumab or placebo.  (4) Completed postmastectomy radiation to the left chest wall and left supraclavicular fossa 12/07/2009  (5) Received Tamoxifen from 01/2010 until 01/2011  (6) Status post TAH-BSO 02/17/2011 with benign pathology  (7) Started anastrozole in 03/2011; discontinued September 2015 with a diagnosis of metastatic disease.  (8) Genetic counseling requested 04/18/2014  METASTATIC DISEASE: (9) metastatic recurrence involving mediastinal and left hilar lymph nodes, bilateral pleural thickening, bilateral pulmonary nodules, the hepatoduodenal ligament lymph nodes, and numerous bony lesions including C2 T2-T8 and T9 noted on scans September 2015; right iliac bone marrow biopsy 08/22/2014 confirms metastatic adenocarcinoma with extracellular mucin, estrogen receptor positive  (10) patient refuses life-prolonging treatment; hospice referral place 08/24/2014  (a) morphine Contin 120 mg twice a day with MSIR 30 mg Q 3h PRN  (b) bowel prophylaxis with daily MiraLAX and stool softeners, 2 tablets twice or three times a day, and lactulose daily; magnsium citrate every 4th day if No BM after 3 days   PLAN: Madison [name] and her family are working hard to get her through this difficult time with hospices help. I think the main things that have turned tied are the fact that family is they are now 24/7,  and that they are giving her her medications. This really minimizes medication errors and also let us know more exactly what she is taking and what she is not taking.  She is using breakthrough pain medicine 5 or 6 times a day at least half the time. I think this indicates a need to increase the baseline. Accordingly I am changing the MS Contin from 30 mg tablets to 60 mg tablets and she will be taking 120 mg  twice a day. She will continue the MSIR at 15-30 mg every 3 hours as needed.  She is doing only fair on the bowel movement issue. She needs to take marrow lacks, stool softeners, lactulose, and Senokot every day. Her goal is a soft bowel movement every 3 days. If she hasn't had a bowel movement in 3 days she can take some magnesium sulfate and that will help.  Otherwise have encouraged her to drink more fluids. They have a nice party planned for Christmas and she has bottle on a presents for her children.  She is going to see me again in 2 months. She knows to call for any problems that may develop before that visit.  Chauncey Cruel, MD  11/21/2014   12:47 PM

## 2014-11-22 ENCOUNTER — Telehealth: Payer: Self-pay | Admitting: Family Medicine

## 2014-11-22 ENCOUNTER — Telehealth: Payer: Self-pay | Admitting: Oncology

## 2014-11-22 DIAGNOSIS — C50919 Malignant neoplasm of unspecified site of unspecified female breast: Secondary | ICD-10-CM

## 2014-11-22 NOTE — Progress Notes (Signed)
Opened in error. Sent [employer].

## 2014-12-05 ENCOUNTER — Encounter: Payer: Self-pay | Admitting: Neurology

## 2014-12-05 ENCOUNTER — Ambulatory Visit (INDEPENDENT_AMBULATORY_CARE_PROVIDER_SITE_OTHER): Admitting: Neurology

## 2014-12-05 VITALS — BP 118/84 | HR 94 | Resp 18 | Ht 65.0 in | Wt 252.0 lb

## 2014-12-05 DIAGNOSIS — C50912 Malignant neoplasm of unspecified site of left female breast: Secondary | ICD-10-CM

## 2014-12-05 DIAGNOSIS — G40319 Generalized idiopathic epilepsy and epileptic syndromes, intractable, without status epilepticus: Secondary | ICD-10-CM

## 2014-12-05 DIAGNOSIS — R519 Headache, unspecified: Secondary | ICD-10-CM

## 2014-12-05 DIAGNOSIS — R51 Headache: Secondary | ICD-10-CM

## 2014-12-05 NOTE — Patient Instructions (Signed)
1. Continue all your current medications 2. Brain stimulation exercises (word search, crossword puzzles) are helpful for brain health 3. Follow-up in 3 months

## 2014-12-05 NOTE — Progress Notes (Signed)
NEUROLOGY FOLLOW UP OFFICE NOTE  [name] [id number]  HISTORY OF PRESENT ILLNESS: I had the pleasure of seeing Madison [name] in follow-up in the neurology clinic on 12/05/2014.  The patient was last seen 3 months ago for idiopathic generalized epilepsy. In the interim, she had an unwitnessed seizure mid-December. It appears this may have been related to some confusion with the medications. She had been lethargic and unable to take medications for a few days. Family now rotates being with her 24/7, managing her medications. This has helped with her mental status, she is now awake with no witnessed seizures. She continues to have chronic daily headaches. She hurts all over. She denies any dizziness, diplopia, dysarthria/dysphagia, focal numbness/tingling. She ambulates with a walker.  Seizure History: This is a 39 yo LH woman with a history of metastatic breast cancer, hypertension, migraines, anxiety/depression, and idiopathic epilepsy with generalized seizures. She had a single seizure occurred in 2008 and no medication was initiated at that time. Seizures recurred in October 2010 during chemotherapy. She would have clusters of seizures every 2-3 weeks. She denies any prior warning symptoms. Semiology described as head turning to the right, loud guttural sound, falling to the floor if standing, becomes stiff and shakes. Afterwards, she reports feeling like "someone has beat me up, head hurts for several days and am in a fog." She had an EMU admission at [facility] in June 2012 which showed abnormal EEG with irregularly generalized paroxysms of spike and polyspike and wave complexes. Two seizures were captured consisting of decreased responsiveness followed by tonic clonic activity with right head turn and right arm elevation preceding, with generalized ictal onset, ?secondary bilateral synchrony. MRI of brain in August 2012 was normal, as was the EEG done at the same time. She continued to have  seizures and had another EMU admission on from 09/05/13 - 09/10/13. Typical events were not captured. She was feeling much better, more awake and alert with medication taper, and home dose of Zonegran was discontinued. She resumed Keppra and Lamictal doses at discharge. She was also taking Lyrica for leg pain, doses higher than 150mg  BID caused sedation. She was tried on Neurontin up to 1200mg  TID, which also caused sedation and seizures, tapered off. Last seizure was in August. She continues as well on Lamotrigine 300mg  BID (higher doses caused dizziness and hallucinations). She takes prn clonazepam 2mg  after seizure to prevent clustering.  She has daily 2/10 headaches, with a "slow/dull burn" in the frontal or occipital regions, no associated nausea, vomiting, photo/phonophobia. She reports these are different that her migraines. She takes MS Contin for the pain, no effect on headaches. She takes Cymbalta 60mg  BID for depression and pain. Elavil has been weaned off, it was used for headache in the past.   She had a normal birth and early development. There is no history of febrile convulsions, CNS infections such as meningitis/encephalitis, significant traumatic brain injury, neurosurgical procedures, or family history of seizures.  Previous AEDs: Vimpat, Zonegran, Neurontin (for dysesthetic pain), Topamax  MRI at [facility] 07/07/13 per report: degraded by patient motion, normal, hippocampi symmetric. Images unavailable for review.  PAST MEDICAL HISTORY: Past Medical History  Diagnosis Date  . Tobacco user   . Grand mal seizure   . Fibromyalgia   . Seizures   . GERD (gastroesophageal reflux disease)   . Depression   . Arthritis   . Allergy   . Hypertension   . Breast CA     MEDICATIONS: Current Outpatient  Prescriptions on File Prior to Visit  Medication Sig Dispense Refill  . Alum & Mag Hydroxide-Simeth (MAGIC MOUTHWASH) SOLN Take 5 mLs by mouth 3 (three) times daily as needed for  mouth pain. 120 mL 0  . antiseptic oral rinse (BIOTENE) LIQD 15 mLs by Mouth Rinse route as needed for dry mouth.    Marland Kitchen aspirin 81 MG tablet Take 81 mg by mouth daily.    . captopril-hydrochlorothiazide (CAPOZIDE) 25-25 MG per tablet Take 1 tablet by mouth 2 (two) times daily. 60 tablet 5  . carvedilol (COREG) 6.25 MG tablet Take 1 tablet (6.25 mg total) by mouth 2 (two) times daily. 180 tablet 3  . cyclobenzaprine (FLEXERIL) 10 MG tablet Take 10 mg by mouth at bedtime.    . DULoxetine (CYMBALTA) 60 MG capsule Take 60 mg by mouth 2 (two) times daily.    . fluticasone (FLONASE) 50 MCG/ACT nasal spray Place 2 sprays into both nostrils daily as needed for allergies or rhinitis.    . LamoTRIgine 300 MG TB24 Take 1 tablet by mouth 2 (two) times daily. Can not be increased due to hallucinations    . levETIRAcetam (KEPPRA) 1000 MG tablet Take 3,000 mg by mouth 2 (two) times daily.    Marland Kitchen lidocaine (XYLOCAINE) 2 % solution Take 20 mLs by mouth as needed. Pt states she uses after a seizure.    Marland Kitchen LYRICA 150 MG capsule Take 150 mg by mouth 2 (two) times daily.     . Melatonin 10 MG CAPS Take by mouth daily.    Marland Kitchen morphine (MS CONTIN) 60 MG 12 hr tablet Take 2 tablets (120 mgs) by mouth every 12 hours--- HOSPICE PATIENT 120 tablet 0  . morphine (MSIR) 15 MG tablet Take 1-2 tablets (15-30 mg total) by mouth every 3 (three) hours as needed for severe pain. 120 tablet 0  . nystatin (MYCOSTATIN/NYSTOP) 100000 UNIT/GM POWD Apply 1 g topically 2 (two) times daily. 60 g 0  . omeprazole (PRILOSEC) 40 MG capsule Take 1 capsule (40 mg total) by mouth daily. 45 capsule 2  . prochlorperazine (COMPAZINE) 10 MG tablet Take 1 tablet (10 mg total) by mouth every 6 (six) hours as needed for nausea or vomiting. 30 tablet 0  . ranitidine (ZANTAC) 150 MG tablet Take 150 mg by mouth at bedtime.     Marland Kitchen scopolamine (TRANSDERM-SCOP) 1 MG/3DAYS Place 1 patch (1.5 mg total) onto the skin every 3 (three) days. 5 patch 0   Current  Facility-Administered Medications on File Prior to Visit  Medication Dose Route Frequency Provider Last Rate Last Dose  . heparin lock flush 100 unit/mL  500 Units Intravenous Once Eston Esters, MD      . sodium chloride 0.9 % injection 10 mL  10 mL Intravenous PRN Eston Esters, MD      . sodium chloride 0.9 % injection 10 mL  10 mL Intravenous PRN Chauncey Cruel, MD   10 mL at 10/18/13 1434    ALLERGIES: Allergies  Allergen Reactions  . Taxol [Paclitaxel] Shortness Of Breath  . Adhesive [Tape] Rash  . Ultram [Tramadol Hcl] Rash    FAMILY HISTORY: Family History  Problem Relation Age of Onset  . Diabetes Sister   . Hypertension Maternal Grandfather     SOCIAL HISTORY: History   Social History  . Marital Status: Single    Spouse Name: N/A    Number of Children: N/A  . Years of Education: N/A   Occupational History  . Not on  file.   Social History Main Topics  . Smoking status: Current Every Day Smoker -- 1.00 packs/day for 25 years    Types: Cigarettes  . Smokeless tobacco: Never Used     Comment: 1 ppd for 20 years   . Alcohol Use: No  . Drug Use: No  . Sexual Activity: Not Currently    Birth Control/ Protection: Surgical   Other Topics Concern  . Not on file   Social History Narrative   Living w parents in [place] due to cancer and inability to take car for herself or others. Has 3 children. 13 yo has bipolar and MR, currently in foster home. Other 2 live with her. Divorced.     REVIEW OF SYSTEMS: Constitutional: No fevers, chills, or sweats, + generalized fatigue, change in appetite Eyes: No visual changes, double vision, eye pain Ear, nose and throat: No hearing loss, ear pain, nasal congestion, sore throat Cardiovascular: No chest pain, palpitations Respiratory:  No shortness of breath at rest or with exertion, wheezes GastrointestinaI: No nausea, vomiting, diarrhea, abdominal pain, fecal incontinence Genitourinary:  No dysuria, urinary retention or  frequency Musculoskeletal:  + neck pain, back pain Integumentary: No rash, pruritus, skin lesions Neurological: as above Psychiatric: + depression, insomnia, anxiety Endocrine: No palpitations, fatigue, diaphoresis, mood swings, change in appetite, change in weight, increased thirst Hematologic/Lymphatic:  No anemia, purpura, petechiae. Allergic/Immunologic: no itchy/runny eyes, nasal congestion, recent allergic reactions, rashes  PHYSICAL EXAM: Filed Vitals:   12/05/14 1009  BP: 118/84  Pulse: 94  Resp: 18   General: No acute distress, sitting on wheelchair Head:  Normocephalic/atraumatic Neck: supple, no paraspinal tenderness, full range of motion Heart:  Regular rate and rhythm Lungs:  Clear to auscultation bilaterally Back: No paraspinal tenderness Skin/Extremities: No rash, no edema Neurological Exam: alert and oriented to person, place, month/year, no dysarthria or aphasia, Fund of knowledge is appropriate. Recent and remote memory are intact. 2/3 delayed recall. Had difficulty spelling WORLD backward. Attention and concentration are normal. Able to name objects and repeat phrases.  Cranial nerves: CN I: not tested CN II: pupils equal, round and reactive to light, visual fields intact, fundi unremarkable. CN III, IV, VI: full range of motion, no nystagmus, no ptosis CN V: intact to light touch CN VII: upper and lower face symmetric CN VIII: hearing intact to finger rub CN IX, X: gag intact, uvula midline CN XI: sternocleidomastoid and trapezius muscles intact CN XII: tongue midline Bulk & Tone: normal, no fasciculations. Motor: 5/5 throughout with no pronator drift Sensation: intact to light touch. No extinction to double simultaneous stimulation.  Deep Tendon Reflexes: brisk +3 on both UE, bilateral patella, +2 bilateral ankle jerks, no ankle clonus Plantar responses: downgoing bilaterally Cerebellar: no incoordination on finger to nose testing with bilateral  high frequency low amplitude endpoint tremor, L>R (similar to prior) Gait: sitting on wheelchair, did not bring walker (usually ambulates with walker) Tremor: no resting tremor, +postural and action tremor, L>R  IMPRESSION: This is a 39 yo LH woman with a history of metastatic breast cancer, idiopathic generalized epilepsy (?secondary bilateral synchrony), back and leg pain, headaches. She has had 1 breakthrough seizure in the past month, which appears to have been due to medication confusion. Family is now monitoring her medications 24/7 and she is doing much better. Continue 3000mg  BID, Lamictal 300mg  BID, and Lyrica 150mg  BID. She again reported headaches, we discussed options, she feels this would not help and would like to hold off on addition of  further medications at this time. She is not driving and is aware of [place] driving laws that one should not drive until 6 months seizure-free. She will follow-up in 3 months and knows to call our office for any problems.   Thank you for allowing me to participate in her care.  Please do not hesitate to call for any questions or concerns.  The duration of this appointment visit was 15 minutes of face-to-face time with the patient.  Greater than 50% of this time was spent in counseling, explanation of diagnosis, planning of further management, and coordination of care.   Ellouise Newer, M.D.   CC: Dr. Dianah Field

## 2014-12-08 ENCOUNTER — Encounter: Payer: Self-pay | Admitting: Neurology

## 2014-12-11 ENCOUNTER — Other Ambulatory Visit: Payer: Self-pay | Admitting: Emergency Medicine

## 2014-12-11 MED ORDER — MORPHINE SULFATE 15 MG PO TABS: 15.0000 mg | ORAL_TABLET | ORAL | Status: DC | PRN

## 2014-12-25 ENCOUNTER — Ambulatory Visit (HOSPITAL_BASED_OUTPATIENT_CLINIC_OR_DEPARTMENT_OTHER): Payer: Medicare Other | Admitting: Nurse Practitioner

## 2014-12-25 ENCOUNTER — Telehealth: Payer: Self-pay | Admitting: *Deleted

## 2014-12-25 VITALS — BP 116/52 | HR 86 | Temp 98.3°F | Resp 18 | Ht 65.0 in | Wt 250.4 lb

## 2014-12-25 DIAGNOSIS — T829XXA Unspecified complication of cardiac and vascular prosthetic device, implant and graft, initial encounter: Secondary | ICD-10-CM | POA: Diagnosis not present

## 2014-12-25 DIAGNOSIS — C50912 Malignant neoplasm of unspecified site of left female breast: Secondary | ICD-10-CM

## 2014-12-25 DIAGNOSIS — C50919 Malignant neoplasm of unspecified site of unspecified female breast: Secondary | ICD-10-CM

## 2014-12-25 DIAGNOSIS — C7951 Secondary malignant neoplasm of bone: Secondary | ICD-10-CM

## 2014-12-25 NOTE — Telephone Encounter (Signed)
Call received from [place] with [facility]  Seth Bake states concern due to " port site where we access her for her IV pain medication is draining "  " when you go to change the needle it is like you can see the hole where the needles has been going in and it drains "  Needle and dressing was changed on Friday-  " now the dressing is all wet and the needle is coming out per the patient "  Plan at present is pt will come in for assessment for appropriate follow up.  Appointment made for NP/[facility]

## 2014-12-26 ENCOUNTER — Other Ambulatory Visit: Payer: Self-pay | Admitting: Nurse Practitioner

## 2014-12-26 ENCOUNTER — Encounter: Payer: Self-pay | Admitting: Nurse Practitioner

## 2014-12-26 ENCOUNTER — Ambulatory Visit (HOSPITAL_COMMUNITY)
Admission: RE | Admit: 2014-12-26 | Discharge: 2014-12-26 | Disposition: A | Discharge status: Home / Self Care | Source: Ambulatory Visit | Attending: Nurse Practitioner | Admitting: Nurse Practitioner

## 2014-12-26 ENCOUNTER — Other Ambulatory Visit: Payer: Self-pay

## 2014-12-26 ENCOUNTER — Ambulatory Visit: Payer: Self-pay | Admitting: Oncology

## 2014-12-26 DIAGNOSIS — C50919 Malignant neoplasm of unspecified site of unspecified female breast: Secondary | ICD-10-CM

## 2014-12-26 DIAGNOSIS — Z452 Encounter for adjustment and management of vascular access device: Secondary | ICD-10-CM | POA: Insufficient documentation

## 2014-12-26 DIAGNOSIS — T829XXA Unspecified complication of cardiac and vascular prosthetic device, implant and graft, initial encounter: Secondary | ICD-10-CM | POA: Insufficient documentation

## 2014-12-26 MED ORDER — HEPARIN SOD (PORK) LOCK FLUSH 100 UNIT/ML IV SOLN
INTRAVENOUS | Status: AC
  Filled 2014-12-26: qty 5

## 2014-12-26 MED ORDER — CEFAZOLIN SODIUM-DEXTROSE 2-3 GM-% IV SOLR: 2.0000 g | Freq: Once | INTRAVENOUS | Status: DC

## 2014-12-26 MED ORDER — LIDOCAINE HCL 1 % IJ SOLN
INTRAMUSCULAR | Status: AC
Start: 2014-12-26 — End: 2014-12-27
  Filled 2014-12-26: qty 20

## 2014-12-26 NOTE — Assessment & Plan Note (Signed)
Patient's right upper chest Port-A-Cath site appears intact on exam today.  However, it was noted that the Port-A-Cath needle was inserted approximate 2 cm above the true Port-A-Cath site.  Quite possibly-Port-A-Cath needle pulled out of the Port-A-Cath hub; and was only in the soft tissue of the skin.  Another possibility is that it was improperly accessed altogether with last needle change/dressing change.  Long discussion with the patient regarding the continued use of her Port-A-Cath with hospice nurse care versus switching to a PICC line.  Patient was adamant in regards to need for PICC line to decrease possibility of dislodgment of Port-A-Cath needle in the future.  Patient feels that she has been getting much less than the prescribed dose of her Dilaudid PCA pump due to inadequate placement of the Port-A-Cath needle recently.  Have arrange for patient to return tomorrow morning for placement of the PICC line.  Patient was in agreement to keep the Port-A-Cath intact at least for the time being.  [place] nurse reaccessed the right upper chest Port-A-Cath this evening; and obtained good blood return.  Reconnected the Dilaudid PCA prior to patient leaving today.

## 2014-12-26 NOTE — Progress Notes (Signed)
SYMPTOM MANAGEMENT CLINIC   HPI: Madison [name] 39 y.o. female diagnosed with breast cancer; with bone metastasis..  Patient is status post bilateral mastectomy, radiation therapy, chemotherapy, tamoxifen, and anastrozole therapy.  Currently undergoing hospice at home care.  Patient called the cancer Center today requesting urgent care visit.  Patient is currently under hospice care at home.  Due to her chronic/severe pain-patient was recently switched this past week to a Dilaudid PCA pump infusing through her right upper chest Port-A-Cath.  Patient reports that the last several times the hospice home health nurse has accessed her port-that her Port-A-Cath needle has slipped out.  Patient feels that she is not receiving the Dilaudid infusion as expected.  She has noted that the dressing surrounding the Port-A-Cath needle become saturated several times per day; and requires dressing changes.  Patient is requesting placement of a PICC line for more adequate control of her continuous infusion PCA pump.  Patient denies any other issues whatsoever.  She denies any recent fevers or chills.     HPI  CURRENT THERAPY: No active treatment plan for patient.   ROS  Past Medical History  Diagnosis Date  . Tobacco user   . Grand mal seizure   . Fibromyalgia   . Seizures   . GERD (gastroesophageal reflux disease)   . Depression   . Arthritis   . Allergy   . Hypertension   . Breast CA     Past Surgical History  Procedure Laterality Date  . Appendectomy    . Tonsillectomy    . Tubal ligation    . Mastectomy      bilateral  . Cholecystectomy  March 2011  . Abdominal hysterectomy  March 2012  . Breast surgery      bilateral mastectomy  . Port-a-cath insertion      has Malignant neoplasm of female breast; GAD (generalized anxiety disorder); TOBACCO USER; Depressive disorder, not elsewhere classified; OTHER CHRONIC SINUSITIS; HOT FLASHES; Convulsions; INSOMNIA; ABDOMINAL PAIN RIGHT  UPPER QUADRANT; ECHOCARDIOGRAM, ABNORMAL; ABNORMAL EKG; Chronic pain; Hypertension, secondary; Obesity; Recurrent falls; Enthesopathy of hip region; Black hairy tongue; Left shoulder pain; Dyspnea; Acid reflux; Subacromial bursitis; Physical deconditioning; Tachycardia; Headache disorder; Fibrositis; Sensory ataxia; Has a tremor; Acute sinusitis; Agoraphobia with panic disorder; Polypharmacy; Patient has active power of attorney for health care; Generalized idiopathic epilepsy, intractable, without status epilepticus; Chronic daily headache; Neuropathy; Neoplasm related pain; Breast cancer metastasized to multiple sites; Pain, chronic; Constipation; and Central line complication on her problem list.    is allergic to taxol; adhesive; and ultram.    Medication List       This list is accurate as of: 12/25/14 11:59 PM.  Always use your most recent med list.               antiseptic oral rinse Liqd  15 mLs by Mouth Rinse route as needed for dry mouth.     captopril-hydrochlorothiazide 25-25 MG per tablet  Commonly known as:  CAPOZIDE  Take 1 tablet by mouth 2 (two) times daily.     carvedilol 6.25 MG tablet  Commonly known as:  COREG  Take 1 tablet (6.25 mg total) by mouth 2 (two) times daily.     cyclobenzaprine 10 MG tablet  Commonly known as:  FLEXERIL  Take 10 mg by mouth at bedtime.     dexamethasone 4 MG tablet  Commonly known as:  DECADRON  Take 4 mg by mouth 2 (two) times daily.     DULoxetine  60 MG capsule  Commonly known as:  CYMBALTA  Take 60 mg by mouth 2 (two) times daily.     fluticasone 50 MCG/ACT nasal spray  Commonly known as:  FLONASE  Place 2 sprays into both nostrils daily as needed for allergies or rhinitis.     HYDROmorphone PCA 2 mg/mL 2 mg/mL Soln  Commonly known as:  DILAUDID  Inject 3.5 mg into the vein continuous. With  0.5 mg bolus every 15 min.     LamoTRIgine 300 MG Tb24  Take 1 tablet by mouth 2 (two) times daily. Can not be increased due to  hallucinations     levETIRAcetam 1000 MG tablet  Commonly known as:  KEPPRA  Take 3,000 mg by mouth 2 (two) times daily.     lidocaine 2 % solution  Commonly known as:  XYLOCAINE  Take 20 mLs by mouth as needed. Pt states she uses after a seizure.     LORazepam 0.5 MG tablet  Commonly known as:  ATIVAN  Take 0.5 mg by mouth at bedtime.     LYRICA 150 MG capsule  Generic drug:  pregabalin  Take 150 mg by mouth 2 (two) times daily.     magic mouthwash Soln  Take 5 mLs by mouth 3 (three) times daily as needed for mouth pain.     Melatonin 10 MG Caps  Take by mouth daily.     naproxen sodium 550 MG tablet  Commonly known as:  ANAPROX  Take 550 mg by mouth 2 (two) times daily with a meal.     nystatin 100000 UNIT/GM Powd  Apply 1 g topically 2 (two) times daily.     omeprazole 40 MG capsule  Commonly known as:  PRILOSEC  Take 1 capsule (40 mg total) by mouth daily.     prochlorperazine 10 MG tablet  Commonly known as:  COMPAZINE  Take 1 tablet (10 mg total) by mouth every 6 (six) hours as needed for nausea or vomiting.     ranitidine 150 MG tablet  Commonly known as:  ZANTAC  Take 150 mg by mouth at bedtime.         PHYSICAL EXAMINATION  Blood pressure 116/52, pulse 86, temperature 98.3 F (36.8 C), temperature source Oral, resp. rate 18, height 5\' 5"  (1.651 m), weight 250 lb 6.4 oz (113.581 kg), SpO2 96 %, peak flow 3 L/min.  Physical Exam  Constitutional: She is oriented to person, place, and time and well-developed, well-nourished, and in no distress.  HENT:  Head: Normocephalic and atraumatic.  Eyes: Conjunctivae and EOM are normal. Pupils are equal, round, and reactive to light. Right eye exhibits no discharge. Left eye exhibits no discharge. Scleral icterus is present.  Neck: Normal range of motion.  Pulmonary/Chest: Effort normal and breath sounds normal. No respiratory distress. She exhibits no tenderness.  Right upper chest Port-A-Cath intact.  However-it  does appear that the Port-A-Cath needle was inserted approximately 2 cm above the actual Port-A-Cath hub.  No evidence of erythema, edema, warmth, or tenderness on exam.  Musculoskeletal: Normal range of motion. She exhibits no edema or tenderness.  Neurological: She is alert and oriented to person, place, and time. Gait normal.  Skin: Skin is warm and dry. No rash noted. No erythema.  Psychiatric: Affect normal.  Nursing note and vitals reviewed.   LABORATORY DATA:. No visits with results within 3 Day(s) from this visit. Latest known visit with results is:  Hospital Outpatient Visit on 08/11/2014  Component Date  Value Ref Range Status  . Glucose-Capillary 08/11/2014 102* 70 - 99 mg/dL Final     RADIOGRAPHIC STUDIES: Ir Fluoro Guide Cv Line Right  12/26/2014   CLINICAL DATA:  39 year old with breast cancer and hospice care. Patient is having problems with the right chest Port-A-Cath. Request for a right arm PICC line. PICC line needed for a continuous infusion.  EXAM: PERIPHERALLY INSERTED CENTRAL VENOUS CATHETER WITH ULTRASOUND AND FLUOROSCOPIC GUIDANCE  FLUOROSCOPY TIME:  30 seconds, 11 mGy  TECHNIQUE: The procedure was explained to the patient. The risks and benefits of the procedure were discussed and the patient's questions were addressed. Informed consent was obtained from the patient. The right arm was prepped with chlorhexidine, draped in the usual sterile fashion using maximum barrier technique (cap and mask, sterile gown, sterile gloves, large sterile sheet, hand hygiene and cutaneous antiseptic). Local anesthesia was attained by infiltration with 1% lidocaine.  Ultrasound demonstrated patency of the right basilic vein, and this was documented with an image. Under real-time ultrasound guidance, this vein was accessed with a 21 gauge micropuncture needle and image documentation was performed. The needle was exchanged over a guidewire for a peel-away sheath through which a 40 cm 5 [place]  single lumen power injectable PICC was advanced, and positioned with its tip in the lower SVC. Fluoroscopy during the procedure and fluoro spot radiograph confirms appropriate catheter position. The catheter was flushed, secured to the skin with Prolene sutures, and covered with a sterile dressing.  COMPLICATIONS: None.  The patient tolerated the procedure well.  IMPRESSION: Successful placement of a right arm PICC with sonographic and fluoroscopic guidance. The catheter is ready for use.   Electronically Signed   By: Markus Daft M.D.   On: 12/26/2014 14:36   Ir US Guide Vasc Access Right  12/26/2014   CLINICAL DATA:  39 year old with breast cancer and hospice care. Patient is having problems with the right chest Port-A-Cath. Request for a right arm PICC line. PICC line needed for a continuous infusion.  EXAM: PERIPHERALLY INSERTED CENTRAL VENOUS CATHETER WITH ULTRASOUND AND FLUOROSCOPIC GUIDANCE  FLUOROSCOPY TIME:  30 seconds, 11 mGy  TECHNIQUE: The procedure was explained to the patient. The risks and benefits of the procedure were discussed and the patient's questions were addressed. Informed consent was obtained from the patient. The right arm was prepped with chlorhexidine, draped in the usual sterile fashion using maximum barrier technique (cap and mask, sterile gown, sterile gloves, large sterile sheet, hand hygiene and cutaneous antiseptic). Local anesthesia was attained by infiltration with 1% lidocaine.  Ultrasound demonstrated patency of the right basilic vein, and this was documented with an image. Under real-time ultrasound guidance, this vein was accessed with a 21 gauge micropuncture needle and image documentation was performed. The needle was exchanged over a guidewire for a peel-away sheath through which a 40 cm 5 [place] single lumen power injectable PICC was advanced, and positioned with its tip in the lower SVC. Fluoroscopy during the procedure and fluoro spot radiograph confirms appropriate  catheter position. The catheter was flushed, secured to the skin with Prolene sutures, and covered with a sterile dressing.  COMPLICATIONS: None.  The patient tolerated the procedure well.  IMPRESSION: Successful placement of a right arm PICC with sonographic and fluoroscopic guidance. The catheter is ready for use.   Electronically Signed   By: Markus Daft M.D.   On: 12/26/2014 14:36    ASSESSMENT/PLAN:    Central line complication Patient's right upper chest Port-A-Cath site appears intact  on exam today.  However, it was noted that the Port-A-Cath needle was inserted approximate 2 cm above the true Port-A-Cath site.  Quite possibly-Port-A-Cath needle pulled out of the Port-A-Cath hub; and was only in the soft tissue of the skin.  Another possibility is that it was improperly accessed altogether with last needle change/dressing change.  Long discussion with the patient regarding the continued use of her Port-A-Cath with hospice nurse care versus switching to a PICC line.  Patient was adamant in regards to need for PICC line to decrease possibility of dislodgment of Port-A-Cath needle in the future.  Patient feels that she has been getting much less than the prescribed dose of her Dilaudid PCA pump due to inadequate placement of the Port-A-Cath needle recently.  Have arrange for patient to return tomorrow morning for placement of the PICC line.  Patient was in agreement to keep the Port-A-Cath intact at least for the time being.  [place] nurse reaccessed the right upper chest Port-A-Cath this evening; and obtained good blood return.  Reconnected the Dilaudid PCA prior to patient leaving today.   Malignant neoplasm of female breast Patient is status post bilateral mastectomies, radiation therapy, chemotherapy therapy, tamoxifen, and anastrozole therapy.  She is currently undergoing hospice at home care.  Patient has plans to return on 01/29/2015 for a follow-up visit.  She knows to call in the  interim for any new worries or concerns.   Patient stated understanding of all instructions; and was in agreement with this plan of care. The patient knows to call the clinic with any problems, questions or concerns.   Review/collaboration with Dr. Jana Hakim regarding all aspects of patient's visit today.   Total time spent with patient was 40 minutes;  with greater than 75 percent of that time spent in face to face counseling regarding her symptoms, and coordination of care and follow up.  Disclaimer: This note was dictated with voice recognition software. Similar sounding words can inadvertently be transcribed and may not be corrected upon review.   Drue Second, NP 12/26/2014

## 2014-12-26 NOTE — Procedures (Signed)
Placement of right arm PICC.  Placed in right basilic vein and tip in lower SVC.  No immediate complication.

## 2014-12-26 NOTE — Assessment & Plan Note (Signed)
Patient is status post bilateral mastectomies, radiation therapy, chemotherapy therapy, tamoxifen, and anastrozole therapy.  She is currently undergoing hospice at home care.  Patient has plans to return on 01/29/2015 for a follow-up visit.  She knows to call in the interim for any new worries or concerns.

## 2015-01-18 ENCOUNTER — Telehealth: Payer: Self-pay | Admitting: *Deleted

## 2015-01-18 NOTE — Telephone Encounter (Signed)
Madison [name] received from [facility] with [facility] for an Interim Order Report for two medications.  Report this was faxed on 01-09-2015 and they have not received orders yet.  No scanned document match scanned in "Media".  Request routed to collaborative nurse.

## 2015-01-23 ENCOUNTER — Telehealth: Payer: Self-pay | Admitting: *Deleted

## 2015-01-23 NOTE — Telephone Encounter (Signed)
01/23/15 @ 9:55 am:  Hospice nurse called to obtain last recorded weight in this office.  Same given.

## 2015-01-29 ENCOUNTER — Ambulatory Visit: Payer: Self-pay | Admitting: Oncology

## 2015-03-06 ENCOUNTER — Ambulatory Visit: Payer: Self-pay | Admitting: Neurology

## 2015-03-24 NOTE — Discharge Summary (Signed)
PATIENT NAME:  Madison [name], Madison [name] MR#:  [id number] DATE OF BIRTH:  Apr 01, 1976  DATE OF ADMISSION:  08/07/2014  DATE OF DISCHARGE:  08/08/2014  PRESENTING COMPLAINT: Fall.   DISCHARGE DIAGNOSES:  1.  Fall without injury.  2.  Possible seizures.  3.  Linear lacerations of the face and lower extremities, likely scratch marks.  4.  History of seizure disorder, chronic.  5.  History of breast cancer with lytic lesions noted on C1, C2 were debris on CT cervical spine as part of fall workup.  6.  CODE STATUS: No code, DO NOT RESUSCITATE.   MEDICATIONS:  1.  Clonazepam 0.5 mg 1 to 2 b.i.d.  2.  Captopril hydrochlorothiazide 25/25, 1 tablet b.i.d.  3.  Lyrica 150 mg 2 capsules b.i.d.  4.  Duloxetine 60 mg 1 capsule p.o. daily.  5.  Duloxetine 20 mg p.o. daily.  6.  Arimidex 1 tablet p.o. daily.  7.  Omeprazole 40 mg p.o. daily.  8.  Cyclobenzaprine 10 mg at bedtime and during daytime as needed.  9.  Ranitidine 150 mg daily in the evening.  10. Zofran 4 mg every 8 p.r.n.  11. Tylenol 500 mg daily as needed.  12. Lidocaine topical, apply to affected area as needed.  13. Clonazepam 1 mg 2 tablets sublingual once with the first seizure. 14. Carbamide peroxide topical liquid to affected area 4 times a day.  15. Coreg 6.25 b.i.d.  16. Diazepam 2.5 mg rectal as needed.  17. Lamotrigine 100 mg 3 tablets b.i.d.  18. Diflucan 150 mg p.o. daily as needed.  19. Fluticasone 50 mcg inhalation powder 1 puff b.i.d.  20. Neurontin 300 mg 4 capsules b.i.d.  21. Keppra 2500 mg p.o. daily b.i.d.  22. Naproxen 250 p.o. daily b.i.d.  23. Hydrocodone/acetaminophen 5/325, 1 tablet b.i.d. as needed.   DIET: Regular.   FOLLOW UP:  1.  Neurologist Dr. Jacelyn Grip at [facility] in 1 to 2 weeks.  2.  Follow up with Dr. Beverely Pace oncology with [facility] in 1 to 2 weeks.  3.  Follow up with [place] in 1 to 2 weeks.   LABORATORY DATA:  CBC within normal limits. Basic metabolic panel within normal  limits except potassium of 3.4.   DIAGNOSTIC DATA: 1.  CT of the head is negative for any fracture or any hemorrhage or mass.  CT cervical spine shows lytic lesion C2 and T2 osseous lesions highly suspicious for osseous metastatic disease in the setting of breast cancer.  2.  Left femur. Negative for fracture.  3.  Chest x-ray no evidence of acute cardiopulmonary abnormality. SGOT is 42. The rest of the LFTs within normal limits. Cardiac enzymes x 1 negative. Lipase is 116.   BRIEF SUMMARY OF HOSPITAL COURSE: Madison [name] is a 39 year old obese Caucasian female with past medical history of chronic seizures, history of breast cancer on Arimidex, completed chemotherapy, comes into the Emergency Room after she had sustained a fall and possibly seizure.  The patient does not remember the episode.  She was admitted with:  1.  Follow, cause uncertain, possible seizure versus polypharmacy.  No witnessed seizures in the Emergency Room or in the hospital. No aura per patient.   She is hemodynamically stable.  She is on multiple seizure meds, which she is followed closely by Dr. Jacelyn Grip, Neurology at [facility]. CT head negative. The patient prefers to follow up with him.  The patient is advised to follow up in the next few days.  2.  History of chronic seizures. Continued antiseizure medications.  No more seizures here.  She wishes to go home and follow up with Dr. Jacelyn Grip. The patient's sister and mom would stay with her at home.  3.  Multiple linear laceration, appears either self-induced.  The patient reported doing this before with her seizures and or her pet dog could have scratched her.  She does not remember. No signs of infection.  4.  History of breast cancer, status post chemotherapy in the past, now on Arimidex.  CT cervical spine shows lytic lesions C1.  Report given her to follow up with Dr. Beverely Pace Oncology [facility] who I called and gave an update on.  The patient has an MRI spine scheduled  as outpatient 08/13/2014. The patient is aware of the appointment.  5.  Morbid obesity.  6.  Deep vein thrombosis prophylaxis with Lovenox.   Hospital stay otherwise remained stable.   CODE STATUS: The patient remained a no code DO NOT RESUSCITATE.   Discharge plans were discussed with the patient's mother and sister.   TIME SPENT: 40 minutes.     ____________________________ Hart Rochester Posey Pronto, MD sap:DT D: 08/08/2014 14:15:36 ET T: 08/08/2014 16:55:30 ET JOB#: [id number]  cc: Vinie Charity A. Posey Pronto, MD, <Dictator> Dr. Beverely Pace, Oncology [facility] Jacelyn Grip, Neurologist [facility] Ilda Basset MD ELECTRONICALLY SIGNED 08/10/2014 14:03

## 2015-03-24 NOTE — H&P (Signed)
PATIENT NAME:  Madison [name], Madison [name] MR#:  [id number] DATE OF BIRTH:  19-Aug-1976  DATE OF ADMISSION:  08/08/2014  PRIMARY CARE PHYSICIAN: Nonlocal.  REFERRING PHYSICIAN: Cory R. Karma Greaser, MD   CHIEF COMPLAINT: A fall.   HISTORY OF PRESENT ILLNESS: [name] is a 39 year old morbidly obese female with a previous history of breast cancer in 2010; has been in remission, with questionable seizure disorder. Has been on high-dose anti-seizure medications. The patient was walking, lost her balance and fell down. A shelf with a lot of ceramic pottery fell on her. The patient states that the patient remained on the floor for quite some time. When a neighbor arrived, the patient was found to be somewhat confused. The patient has multiple linear lacerations. The patient states that this could be her seizures. The patient denies any weakness in any part of the body. The patient could not tell when was the last time the patient had a seizure.    PAST MEDICAL HISTORY: 1. Hypertension.  2. Previous history of DVT.   3. Neuropathy.  4. PTSD.  5. Depression.  6. Epilepsy. 7. Wolff-Parkinson-White syndrome.  8. Breast CA.  9. Fibromyalgia.  PAST SURGICAL HISTORY: 1. Cholecystectomy.  2. Tonsillectomy.  3. Adenoidectomy.  4. Hysterectomy.  5. Appendectomy.  6. Bilateral mastectomies.   ALLERGIES:  1. TAXOL.  2. TRAMADOL.  3. WELLBUTRIN.   HOME MEDICATIONS: 1. Zofran 4 mg every 8 hours.  2. Tylenol 500 mg every 6 hours as needed.  3. Ranitidine 150 mg once a day.  4. Omeprazole 40 mg once a day.  5. Neurontin 1200 mg 2 times a day.  6. Naprosyn 250 mg 2 times a day.  7. Lyrica 300 mg 2 times a day.  8. Lidocaine topical.  9. Keppra 2500 mg 2 times a day.  10. Lamotrigine 300 mg  times a day.  11. Fluticasone   12. Duloxetine 20 mg once a day.  13. Diazepam 2.5   as needed.  14        20 mg once day. 15. Coreg 6.25 mg 2 times a day.  16. Clonazepam 2 mg sublingual daily.   17. Captopril/hydrochlorothiazide 1 tablet 2 times a day.  18.  1 mg once a day.   SOCIAL HISTORY: Continues to smoke 1 pack a day. Denies drinking alcohol or using illicit drugs. Lives by herself.   FAMILY HISTORY: Hypertension, diabetes mellitus.   REVIEW OF SYSTEMS: CONSTITUTIONAL: Experiencing generalized weakness.  EYES: No change in vision.  ENT: No change in hearing.  RESPIRATORY: Denies any cough, shortness of breath.  CARDIOVASCULAR: No chest pain, palpations well.  GASTROINTESTINAL: No nausea, vomiting, abdominal pain.  GENITOURINARY: No dysuria or hematuria. HEMATOLOGIC: No easy bruising or bleeding.  SKIN: Has multiple lacerations all over the face, neck and hands. NEUROLOGIC: No weakness or numbness in any part of the body.   PHYSICAL EXAMINATION:  GENERAL: This is a well-built, well-nourished obese female lying down in the bed, not in distress.  VITAL SIGNS: Temperature 98.2, pulse 92, blood pressure 131/62, respiratory rate of 16, oxygen saturation is 97% on room air.  HEENT: Head normocephalic, atraumatic. There is no scleral icterus. Conjunctivae normal. Pupils equal and react to light. Mucous membranes moist. No pharyngeal erythema.  NECK: Supple. No lymphadenopathy. No JVD. No carotid bruit.  CHEST: No focal tenderness. Decreased breath sounds in the lower lobes.  HEART: S1, S2 regular. No murmurs are heard.  ABDOMEN: Bowel sounds plus. Soft, nontender, nondistended.  EXTREMITIES: No  pedal edema. Pulses 2+. NEUROLOGIC: Patient is alert, oriented to place, person, and time. Cranial nerves II through XII intact. Motor 5/5 in upper and lower extremities.  SKIN: Has multiple lacerations on hands, legs. Seem to be more self-induced rather than from the trauma.   LABORATORIES: BMP and CBC are completely within normal limits. Coag profile is well within normal limits.   ASSESSMENT AND PLAN: Ms. Beightol is a 39 year old female with a previous history of breast cancer who  comes stating that the patient had a seizure and had a fall.  1. Fall. The cause is uncertain. The patient has polypharmacy, which could be contributing to the patient's falls; however, the patient also states that she has history of seizures. Will obtain medical records from [facility] and follow up. No obvious signs of any infection.  2. Questionable seizures. The patient is on very high-dose seizure medications. Will need to contact [facility] for the medical records. Will continue for now.  3. Multiple lacerations. These seem to be more self-induced rather than from trauma. Will need to be closely followed.   4. Polypharmacy. The patient will need to have decreased doses of multiple medications.  5. Morbid obesity. Consult with the patient; however, the patient seems to have poor appetite.  6. Keep the patient on deep vein thrombosis prophylaxis with Lovenox.   TIME SPENT: 50 minutes.    ____________________________ Monica Becton, MD pv:lm D: 08/08/2014 01:52:56 ET T: 08/08/2014 04:53:51 ET JOB#: [id number]  cc: Monica Becton, MD, <Dictator> Monica Becton MD ELECTRONICALLY SIGNED 08/16/2014 21:32

## 2015-03-26 IMAGING — CT CT BIOPSY
4 of 5 series · 8 of 31 positions shown, 11 images · non-contrast
Comparison: none

CLINICAL DATA: 38-year-old female with a history of breast
carcinoma. She now has evidence of metastatic disease with FDG avid
lesions on PET-CT. She is referred for targeted biopsy of right
iliac crest FDG avid lesion.

[Series 3: add scan 5.0 b70f · axial · 0.82mm/px · z∈[-146,-141]mm · 2 of 4 slices shown, 5 images (1 of 4)]
[im 2/4  soft-tissue]
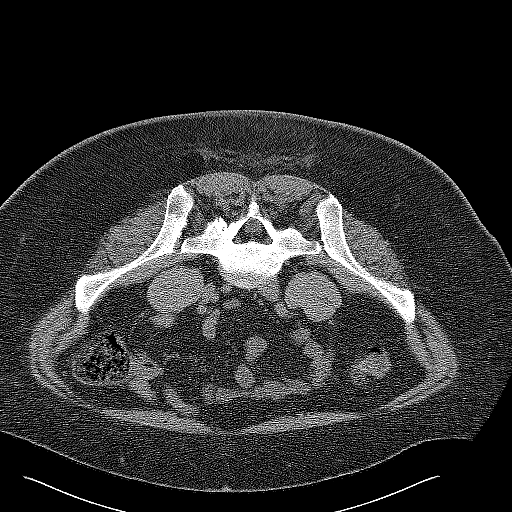
[im 2/4  lung]
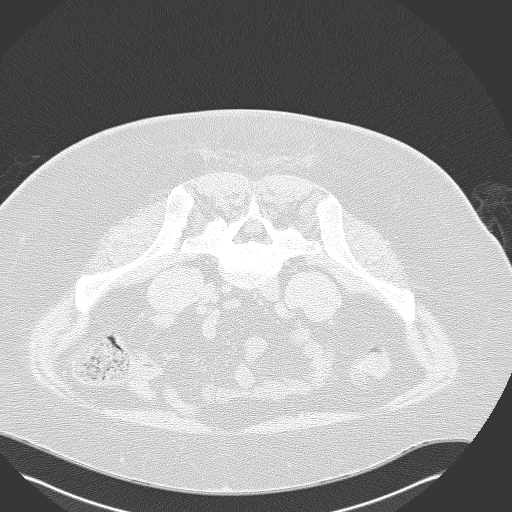
[im 2/4  bone]
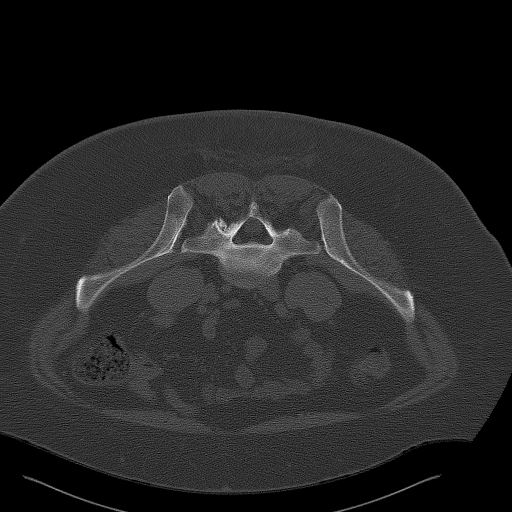
[im 3/4  soft-tissue]
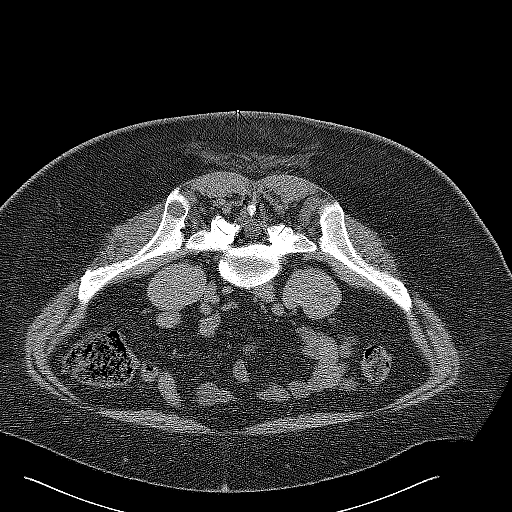
[im 3/4  lung]
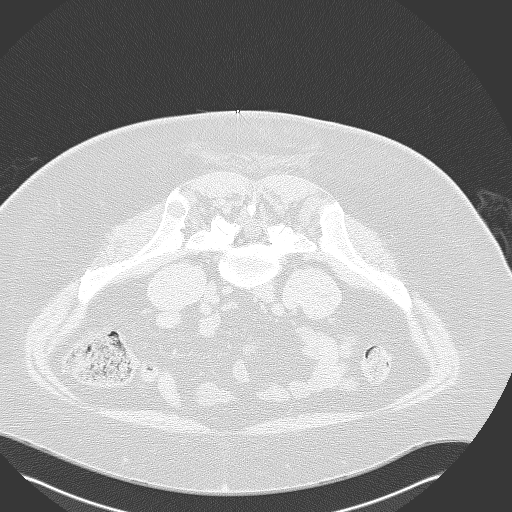

[Series 4: add scan 5.0 b70f · axial · 0.82mm/px · z∈[-144,-139]mm · 2 of 4 slices shown (2 of 4)]
[im 2/4  soft-tissue]
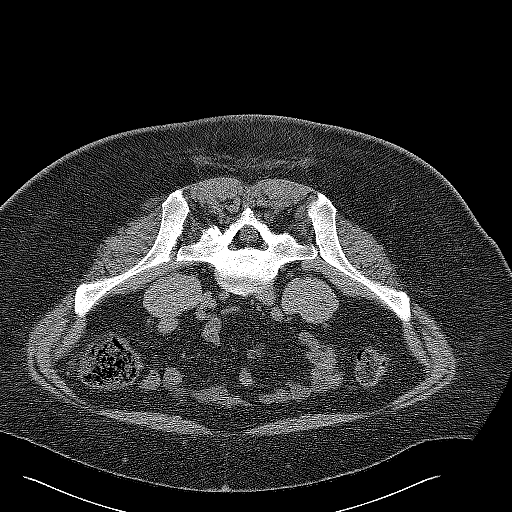
[im 3/4  soft-tissue]
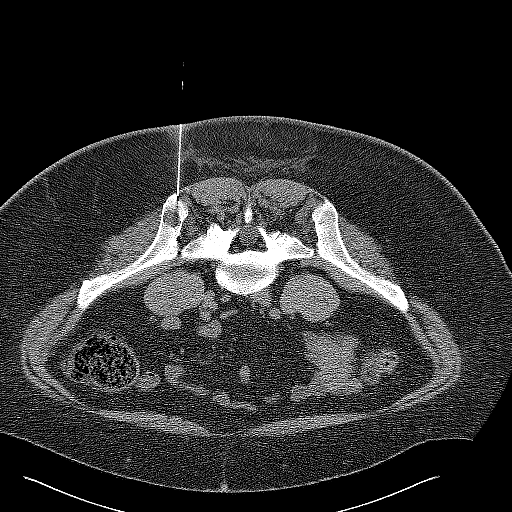

[Series 5: add scan 5.0 b70f · axial · 0.82mm/px · z∈[-140,-134]mm · 2 of 4 slices shown (3 of 4)]
[im 2/4  soft-tissue]
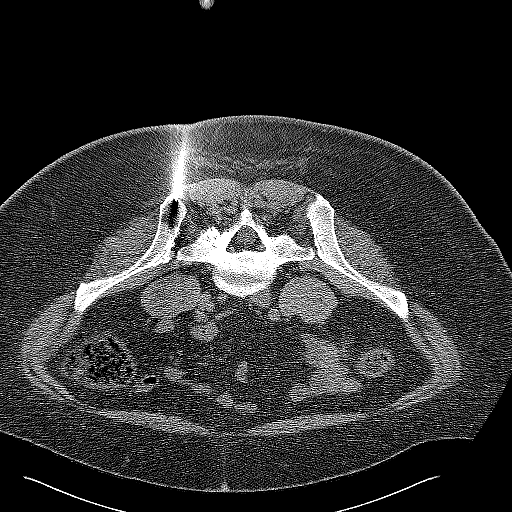
[im 3/4  soft-tissue]
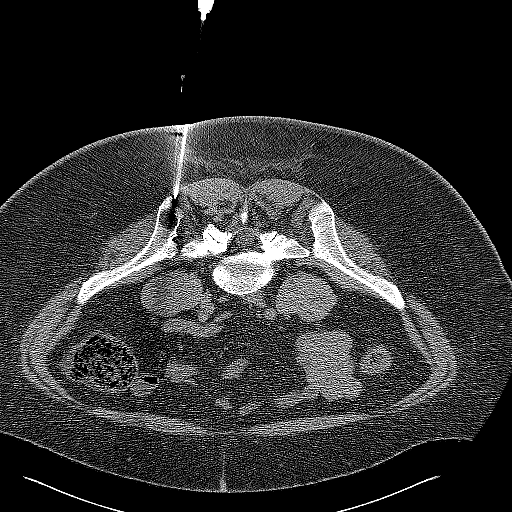

[Series 6: add scan 5.0 b70f · axial · 0.82mm/px · z∈[-140,-134]mm · 2 of 4 slices shown (4 of 4)]
[im 2/4  soft-tissue]
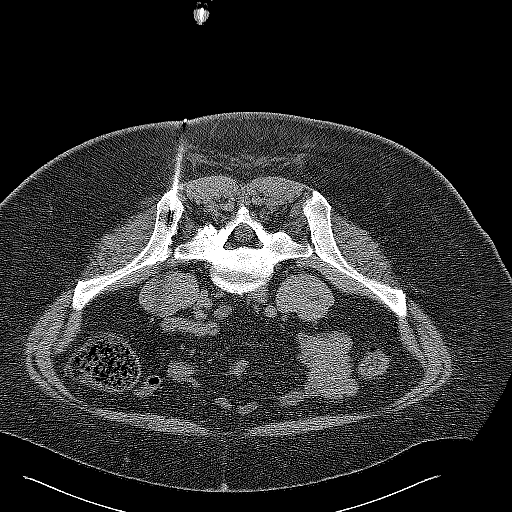
[im 3/4  soft-tissue]
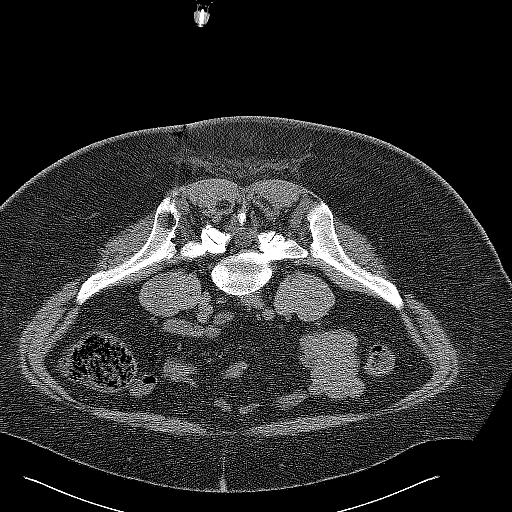

[8 of 31 positions shown; findings below may reference images not displayed]

EXAM:
CT GUIDED fine needle biopsy and core BIOPSY OF right iliac crest
lesion

ANESTHESIA/SEDATION:
2  mg IV Versed; 100 mcg IV Fentanyl

Total Moderate Sedation Time: 20 minutes.

PROCEDURE:
The procedure risks, benefits, and alternatives were explained to
the patient. Questions regarding the procedure were encouraged and
answered. The patient understands and consents to the procedure.

A scout CT of the pelvis was acquired for surgical planning
purposes. Once we determine a safe approach to the right iliac crest
lesion, a fiducial marker was placed on the patient's skin, and
another limited CT image was acquired.

The posterior skin overlying the lesion was prepped with Betadinein
a sterile fashion, and a sterile drape was applied covering the
operative field. A sterile gown and sterile gloves were used for the
procedure. Local anesthesia was provided with 1% Lidocaine. A spinal
needle was advanced to the posterior cortex the right iliac bone
with a final image acquired for location.

A small stab incision was then made with an 11 blade scalpel, and a
15 cm, 11 gauge Sandjaya needle was advanced to the posterior cortex
of the right iliac bone. We confirmed position with a CT image. The
needle was then advanced with manual forced into the posterior
cortex and to the margin of the target lesion. Again, the tip of the
needle was confirmed within the lesion after withdrawing the right
sharp stylet from within the trocar.

We then sample the lesion with for separate 20 gauge Yun
Gaderi, 2 separate 18 gauge core biopsy, and then a final 11 gauge
core biopsy with the Sandjaya needle. All of the tissue specimen were
passed to a cytotechnologist in the room for slide preparation and
transportation of pathology lab.

Upon withdrawal of the Sandjaya needle, a sterile bandage was placed.

The patient tolerated the procedure well and remained
hemodynamically stable throughout.

No complications were encountered and no significant blood loss
encounter.

Complications: None
FINDINGS: Lytic lesion within the right posterior iliac bone, similar to
comparison PET-CT.

Images during the case demonstrate placement of 11 gauge Sandjaya
needle at the margin of the lesion. All samples were completed
through the Sandjaya needle, with the exception of the final 11 gauge
core biopsy, which was acquired with the Sandjaya needle itself.
IMPRESSION: Status post CT-guided biopsy of right posterior iliac bone lesion,
with tissue specimen sent to pathology for complete histopathologic
analysis.

## 2015-04-02 ENCOUNTER — Telehealth: Payer: Self-pay | Admitting: *Deleted

## 2015-04-02 NOTE — Telephone Encounter (Signed)
PLAN OF CARE HAS NOT BEEN RETURNED TO HOSPICE. REQUESTED [facility] TO REFAX PT.'S PLAN OF CARE. RECEIVED PLAN OF CARE AND GAVE TO DR.MAGRINAT'S NURSE, Lonell Grandchild

## 2015-05-02 DEATH — deceased

## 2015-05-03 ENCOUNTER — Telehealth: Payer: Self-pay

## 2015-05-03 NOTE — Telephone Encounter (Signed)
Signed order faxed to hospice.  Sent to scan.

## 2015-05-29 ENCOUNTER — Telehealth: Payer: Self-pay | Admitting: *Deleted

## 2015-05-29 NOTE — Telephone Encounter (Signed)
VM message from [facility] @ [place] requesting signed orders be fax'd to them  Please call tamara @ [phone]

## 2015-05-30 ENCOUNTER — Encounter: Payer: Self-pay | Admitting: Family Medicine

## 2015-05-30 NOTE — Progress Notes (Signed)
Patient ID: Madison [name], female   DOB: [birth year], 39 y.o.   MRN: [id number]   I am saddened to find out via EPIC message that patient has expired, but happy that she was managed by [facility] in her final months.  [name], MD

## 2016-05-17 ENCOUNTER — Other Ambulatory Visit: Payer: Self-pay | Admitting: Nurse Practitioner
